# Patient Record
Sex: Female | Born: 1955
Health system: Southern US, Community
[De-identification: ages and names within clinical notes are randomized; demographics above are authoritative.]

## PROBLEM LIST (undated history)

## (undated) DIAGNOSIS — K635 Polyp of colon: Secondary | ICD-10-CM

## (undated) DIAGNOSIS — IMO0002 Reserved for concepts with insufficient information to code with codable children: Secondary | ICD-10-CM

## (undated) DIAGNOSIS — E039 Hypothyroidism, unspecified: Secondary | ICD-10-CM

## (undated) DIAGNOSIS — M779 Enthesopathy, unspecified: Secondary | ICD-10-CM

## (undated) DIAGNOSIS — F32A Depression, unspecified: Secondary | ICD-10-CM

## (undated) DIAGNOSIS — I2510 Atherosclerotic heart disease of native coronary artery without angina pectoris: Secondary | ICD-10-CM

## (undated) DIAGNOSIS — F419 Anxiety disorder, unspecified: Secondary | ICD-10-CM

## (undated) DIAGNOSIS — E7800 Pure hypercholesterolemia, unspecified: Secondary | ICD-10-CM

## (undated) DIAGNOSIS — N390 Urinary tract infection, site not specified: Secondary | ICD-10-CM

## (undated) DIAGNOSIS — F329 Major depressive disorder, single episode, unspecified: Secondary | ICD-10-CM

## (undated) DIAGNOSIS — I219 Acute myocardial infarction, unspecified: Secondary | ICD-10-CM

## (undated) DIAGNOSIS — M1990 Unspecified osteoarthritis, unspecified site: Secondary | ICD-10-CM

## (undated) DIAGNOSIS — R7611 Nonspecific reaction to tuberculin skin test without active tuberculosis: Secondary | ICD-10-CM

## (undated) DIAGNOSIS — I10 Essential (primary) hypertension: Secondary | ICD-10-CM

## (undated) DIAGNOSIS — I209 Angina pectoris, unspecified: Secondary | ICD-10-CM

## (undated) DIAGNOSIS — K219 Gastro-esophageal reflux disease without esophagitis: Secondary | ICD-10-CM

## (undated) DIAGNOSIS — E785 Hyperlipidemia, unspecified: Secondary | ICD-10-CM

## (undated) HISTORY — PX: POLYPECTOMY: SHX149

## (undated) HISTORY — DX: Reserved for concepts with insufficient information to code with codable children: IMO0002

## (undated) HISTORY — DX: Pure hypercholesterolemia, unspecified: E78.00

## (undated) HISTORY — DX: Unspecified osteoarthritis, unspecified site: M19.90

## (undated) HISTORY — DX: Essential (primary) hypertension: I10

## (undated) HISTORY — DX: Anxiety disorder, unspecified: F41.9

## (undated) HISTORY — DX: Hypothyroidism, unspecified: E03.9

## (undated) HISTORY — PX: TONGUE BIOPSY: SHX1075

## (undated) HISTORY — DX: Urinary tract infection, site not specified: N39.0

## (undated) HISTORY — PX: OTHER SURGICAL HISTORY: SHX169

## (undated) HISTORY — DX: Acute myocardial infarction, unspecified: I21.9

## (undated) HISTORY — DX: Enthesopathy, unspecified: M77.9

## (undated) HISTORY — PX: LAPAROSCOPY: SHX197

## (undated) HISTORY — DX: Polyp of colon: K63.5

## (undated) HISTORY — PX: LUMBAR EPIDURAL INJECTION: SHX1980

## (undated) HISTORY — DX: Depression, unspecified: F32.A

## (undated) HISTORY — DX: Atherosclerotic heart disease of native coronary artery without angina pectoris: I25.10

## (undated) HISTORY — DX: Hyperlipidemia, unspecified: E78.5

## (undated) HISTORY — DX: Major depressive disorder, single episode, unspecified: F32.9

## (undated) HISTORY — PX: TONSILLECTOMY: SUR1361

## (undated) HISTORY — PX: COLONOSCOPY: SHX174

## (undated) HISTORY — PX: TUBAL LIGATION: SHX77

## (undated) HISTORY — PX: BUNIONECTOMY: SHX129

## (undated) HISTORY — DX: Gastro-esophageal reflux disease without esophagitis: K21.9

---

## 2003-05-01 DIAGNOSIS — I2510 Atherosclerotic heart disease of native coronary artery without angina pectoris: Secondary | ICD-10-CM

## 2003-05-01 DIAGNOSIS — I219 Acute myocardial infarction, unspecified: Secondary | ICD-10-CM

## 2003-05-01 DIAGNOSIS — I209 Angina pectoris, unspecified: Secondary | ICD-10-CM

## 2003-05-01 HISTORY — PX: CORONARY ANGIOPLASTY WITH STENT PLACEMENT: SHX49

## 2003-05-01 HISTORY — DX: Atherosclerotic heart disease of native coronary artery without angina pectoris: I25.10

## 2003-05-01 HISTORY — DX: Angina pectoris, unspecified: I20.9

## 2003-05-01 HISTORY — DX: Acute myocardial infarction, unspecified: I21.9

## 2003-07-12 ENCOUNTER — Inpatient Hospital Stay (HOSPITAL_COMMUNITY): Admission: EM | Admit: 2003-07-12 | Discharge: 2003-07-15 | Payer: Self-pay | Admitting: *Deleted

## 2003-07-19 ENCOUNTER — Emergency Department (HOSPITAL_COMMUNITY): Admission: EM | Admit: 2003-07-19 | Discharge: 2003-07-19 | Payer: Self-pay | Admitting: Emergency Medicine

## 2003-08-23 ENCOUNTER — Other Ambulatory Visit: Admission: RE | Admit: 2003-08-23 | Discharge: 2003-08-23 | Payer: Self-pay | Admitting: Obstetrics and Gynecology

## 2004-12-29 ENCOUNTER — Other Ambulatory Visit: Admission: RE | Admit: 2004-12-29 | Discharge: 2004-12-29 | Payer: Self-pay | Admitting: Obstetrics and Gynecology

## 2005-03-07 ENCOUNTER — Ambulatory Visit: Payer: Self-pay | Admitting: Internal Medicine

## 2005-03-07 ENCOUNTER — Encounter: Payer: Self-pay | Admitting: Internal Medicine

## 2005-04-05 ENCOUNTER — Ambulatory Visit: Payer: Self-pay | Admitting: Internal Medicine

## 2005-04-30 HISTORY — PX: OTHER SURGICAL HISTORY: SHX169

## 2005-07-13 ENCOUNTER — Ambulatory Visit (HOSPITAL_COMMUNITY): Admission: RE | Admit: 2005-07-13 | Discharge: 2005-07-13 | Payer: Self-pay | Admitting: Orthopaedic Surgery

## 2005-07-24 ENCOUNTER — Encounter: Admission: RE | Admit: 2005-07-24 | Discharge: 2005-08-16 | Payer: Self-pay | Admitting: Orthopaedic Surgery

## 2006-01-23 ENCOUNTER — Other Ambulatory Visit: Admission: RE | Admit: 2006-01-23 | Discharge: 2006-01-23 | Payer: Self-pay | Admitting: Obstetrics and Gynecology

## 2006-04-20 ENCOUNTER — Emergency Department (HOSPITAL_COMMUNITY): Admission: EM | Admit: 2006-04-20 | Discharge: 2006-04-20 | Payer: Self-pay | Admitting: Emergency Medicine

## 2006-08-07 ENCOUNTER — Ambulatory Visit (HOSPITAL_COMMUNITY): Admission: RE | Admit: 2006-08-07 | Discharge: 2006-08-07 | Payer: Self-pay | Admitting: Neurosurgery

## 2006-08-28 ENCOUNTER — Ambulatory Visit (HOSPITAL_BASED_OUTPATIENT_CLINIC_OR_DEPARTMENT_OTHER): Admission: RE | Admit: 2006-08-28 | Discharge: 2006-08-28 | Payer: Self-pay | Admitting: Neurosurgery

## 2007-03-11 ENCOUNTER — Other Ambulatory Visit: Admission: RE | Admit: 2007-03-11 | Discharge: 2007-03-11 | Payer: Self-pay | Admitting: Obstetrics and Gynecology

## 2007-04-08 ENCOUNTER — Emergency Department (HOSPITAL_COMMUNITY): Admission: EM | Admit: 2007-04-08 | Discharge: 2007-04-08 | Payer: Self-pay | Admitting: Emergency Medicine

## 2007-05-01 HISTORY — PX: CORONARY ANGIOPLASTY: SHX604

## 2007-08-01 ENCOUNTER — Ambulatory Visit: Payer: Self-pay | Admitting: *Deleted

## 2007-08-02 ENCOUNTER — Ambulatory Visit: Payer: Self-pay | Admitting: *Deleted

## 2007-08-02 ENCOUNTER — Inpatient Hospital Stay (HOSPITAL_COMMUNITY): Admission: EM | Admit: 2007-08-02 | Discharge: 2007-08-04 | Payer: Self-pay | Admitting: Emergency Medicine

## 2007-08-14 ENCOUNTER — Encounter (HOSPITAL_COMMUNITY): Admission: RE | Admit: 2007-08-14 | Discharge: 2007-11-12 | Payer: Self-pay | Admitting: Cardiovascular Disease

## 2007-08-27 ENCOUNTER — Inpatient Hospital Stay (HOSPITAL_COMMUNITY): Admission: RE | Admit: 2007-08-27 | Discharge: 2007-08-28 | Payer: Self-pay | Admitting: Cardiovascular Disease

## 2007-11-13 ENCOUNTER — Encounter (HOSPITAL_COMMUNITY): Admission: RE | Admit: 2007-11-13 | Discharge: 2008-01-22 | Payer: Self-pay | Admitting: Cardiovascular Disease

## 2009-04-26 ENCOUNTER — Encounter: Admission: RE | Admit: 2009-04-26 | Discharge: 2009-04-26 | Payer: Self-pay | Admitting: Internal Medicine

## 2009-09-30 ENCOUNTER — Ambulatory Visit (HOSPITAL_COMMUNITY)
Admission: RE | Admit: 2009-09-30 | Discharge: 2009-09-30 | Payer: Self-pay | Source: Home / Self Care | Admitting: Orthopedic Surgery

## 2010-01-20 ENCOUNTER — Ambulatory Visit: Payer: Self-pay | Admitting: Cardiovascular Disease

## 2010-01-20 LAB — LIPID PANEL
Cholesterol: 184 mg/dL (ref 0–200)
Triglycerides: 81 mg/dL (ref 40–160)

## 2010-01-24 ENCOUNTER — Ambulatory Visit: Payer: Self-pay | Admitting: Cardiovascular Disease

## 2010-05-04 DIAGNOSIS — I2510 Atherosclerotic heart disease of native coronary artery without angina pectoris: Secondary | ICD-10-CM | POA: Insufficient documentation

## 2010-05-04 DIAGNOSIS — K649 Unspecified hemorrhoids: Secondary | ICD-10-CM | POA: Insufficient documentation

## 2010-05-04 DIAGNOSIS — R519 Headache, unspecified: Secondary | ICD-10-CM | POA: Insufficient documentation

## 2010-05-04 DIAGNOSIS — F411 Generalized anxiety disorder: Secondary | ICD-10-CM | POA: Insufficient documentation

## 2010-05-04 DIAGNOSIS — E785 Hyperlipidemia, unspecified: Secondary | ICD-10-CM | POA: Insufficient documentation

## 2010-05-04 DIAGNOSIS — R51 Headache: Secondary | ICD-10-CM | POA: Insufficient documentation

## 2010-05-04 DIAGNOSIS — F329 Major depressive disorder, single episode, unspecified: Secondary | ICD-10-CM | POA: Insufficient documentation

## 2010-05-10 ENCOUNTER — Ambulatory Visit
Admission: RE | Admit: 2010-05-10 | Discharge: 2010-05-10 | Payer: Self-pay | Source: Home / Self Care | Attending: Internal Medicine | Admitting: Internal Medicine

## 2010-05-10 ENCOUNTER — Encounter: Payer: Self-pay | Admitting: Internal Medicine

## 2010-05-10 DIAGNOSIS — I10 Essential (primary) hypertension: Secondary | ICD-10-CM | POA: Insufficient documentation

## 2010-05-10 DIAGNOSIS — M129 Arthropathy, unspecified: Secondary | ICD-10-CM | POA: Insufficient documentation

## 2010-05-10 DIAGNOSIS — E079 Disorder of thyroid, unspecified: Secondary | ICD-10-CM | POA: Insufficient documentation

## 2010-05-16 ENCOUNTER — Ambulatory Visit: Admission: RE | Admit: 2010-05-16 | Payer: Self-pay | Source: Home / Self Care | Admitting: Internal Medicine

## 2010-05-16 ENCOUNTER — Other Ambulatory Visit: Payer: Self-pay | Admitting: Internal Medicine

## 2010-05-16 ENCOUNTER — Ambulatory Visit
Admission: RE | Admit: 2010-05-16 | Discharge: 2010-05-16 | Payer: Self-pay | Source: Home / Self Care | Attending: Internal Medicine | Admitting: Internal Medicine

## 2010-05-22 ENCOUNTER — Encounter: Payer: Self-pay | Admitting: Internal Medicine

## 2010-06-01 NOTE — Letter (Signed)
Summary: [facility] Instructions  [facility]  [address] [place], [place] [address]   Phone: [phone]  Fax: [phone]       Sarah Ballard    Apr 16, 1956    MRN: [id number]       Procedure Day /Date: Tuesday 05/16/10     Arrival Time: 10:30 am     Procedure Time: 11:30 am     Location of Procedure:                    _x _  [facility] (4th Floor)  PREPARATION FOR COLONOSCOPY WITH MIRALAX  Starting 5 days prior to your procedure 05/11/10 do not eat nuts, seeds, popcorn, corn, beans, peas,  salads, or any raw vegetables.  Do not take any fiber supplements (e.g. Metamucil, Citrucel, and Benefiber). ____________________________________________________________________________________________________   THE DAY BEFORE YOUR PROCEDURE         DATE: 05/15/10 DAY: Monday  1   Drink clear liquids the entire day-NO SOLID FOOD  2   Do not drink anything colored red or purple.  Avoid juices with pulp.  No orange juice.  3   Drink at least 64 oz. (8 glasses) of fluid/clear liquids during the day to prevent dehydration and help the prep work efficiently.  CLEAR LIQUIDS INCLUDE: Water Jello Ice Popsicles Tea (sugar ok, no milk/cream) Powdered fruit flavored drinks Coffee (sugar ok, no milk/cream) Gatorade Juice: apple, white grape, white cranberry  Lemonade Clear bullion, consomm, broth Carbonated beverages (any kind) Strained chicken noodle soup Hard Candy  4   Mix the entire bottle of Miralax with 64 oz. of Gatorade/Powerade in the morning and put in the refrigerator to chill.  5   At 3:00 pm take 2 Dulcolax/Bisacodyl tablets.  6   At 4:30 pm take one Reglan/Metoclopramide tablet.  7  Starting at 5:00 pm drink one 8 oz glass of the Miralax mixture every 15-20 minutes until you have finished drinking the entire 64 oz.  You should finish drinking prep around 7:30 or 8:00 pm.  8   If you are nauseated, you may take the 2nd Reglan/Metoclopramide tablet  at 6:30 pm.        9    At 8:00 pm take 2 more DULCOLAX/Bisacodyl tablets.         THE DAY OF YOUR PROCEDURE      DATE:  05/16/10 DAY: Tuesday  You may drink clear liquids until 9:30 am (2 HOURS BEFORE PROCEDURE).   MEDICATION INSTRUCTIONS  Unless otherwise instructed, you should take regular prescription medications with a small sip of water as early as possible the morning of your procedure.  .Continue taking Coumadin/warfarin per Dr. Lina Sar.        OTHER INSTRUCTIONS  You will need a responsible adult at least 56 years of age to accompany you and drive you home.   This person must remain in the waiting room during your procedure.  Wear loose fitting clothing that is easily removed.  Leave jewelry and other valuables at home.  However, you may wish to bring a book to read or an iPod/MP3 player to listen to music as you wait for your procedure to start.  Remove all body piercing jewelry and leave at home.  Total time from sign-in until discharge is approximately 2-3 hours.  You should go home directly after your procedure and rest.  You can resume normal activities the day after your procedure.  The day of your procedure you  should not:   Drive   Make legal decisions   Operate machinery   Drink alcohol   Return to work  You will receive specific instructions about eating, activities and medications before you leave.   The above instructions have been reviewed and explained to me by   Lamona Curl CMA Duncan Dull)  May 10, 2010 12:06 PM     I fully understand and can verbalize these instructions _____________________________ Date _______

## 2010-06-01 NOTE — Assessment & Plan Note (Signed)
Summary: Gastroenterology  [facility] A MR#:  [id number] Page #  NAME:  Sarah Ballard, Sarah Ballard  OFFICE NO:  [id number]  DATE:  03/07/05  DOB:  2055/12/05  HISTORY OF PRESENT ILLNESS:  The patient is a very nice 55 year old white female who is here to discuss colonoscopy.  She has a history of coronary artery disease status post percutaneous coronary intervention 1 year ago by Dr. Elease Hashimoto for obstruction of left anterior descending.  She is currently on Plavix and aspirin.  She has a strong family history of colon cancer in her mother who died at the age of 33 of advanced colon cancer, which was found at the age of 31.  She died 8 weeks later after surgery.  She also has a family medical history of Crohn's disease in a maternal aunt.  The patient herself is asymptomatic.  She has regular bowel habits, mostly thanks to taking Metamucil on a daily basis.  If she does not take her Metamucil or high fiber, she tends to get constipated.  She has symptomatic hemorrhoids, which flare up at times.  She just turned in Hemoccult cards for Dr. Timothy Lasso, but does not know the results of it yet.  She has had negative Hemoccults in the past.    MEDICATIONS:  Norethindrone 1 a day, furosemide 20 mg p.r.n., Crestor 20 mg q.d., Ecotrin 325 mg p.o. q.d., Plavix 75 mg p.o. q.d., hydrochlorothiazide 25 mg p.o. q.d., Serzone 200 mg b.i.d., Lopressor 25 mg p.o. b.i.d., _________ 12.5 mg p.o. q.d., ________ 1.2 mcg  tablet Monday through Friday, Xanax 0.5 mg b.i.d., Metamucil, Caltrate, and multiple vitamins.  PAST MEDICAL HISTORY:  Coronary artery disease, high cholesterol, thyroid trouble, anxiety, depression, allergies, and chronic headaches.  OPERATIONS:  Angioplasty with stent placement in March 2005.  FAMILY HISTORY:  Positive for colon cancer in mother, inflammatory bowel disease in father's family, heart disease in father and aunt, as well as herself.  SOCIAL HISTORY:  She is divorced, has 3 children.  She is a Engineer, civil (consulting) in the  newborn nursery at [employer]  She does not smoke and drinks a glass of wine on occasion.    REVIEW OF SYSTEMS:  Positive for eyeglasses, allergies, night sweats, and some muscle pain on Lipitor.  PHYSICAL EXAMINATION:  VITAL SIGNS:  Blood pressure 122/80, pulse 78, weight 182 pounds.  GENERAL:  She was alert, oriented, in no distress.  HEENT:  Sclerae are anicteric.  Oral cavity was noted.  NECK:  Supple without lymphadenopathy.  LUNGS:  Clear to auscultation.  COR:  Normal S1, normal S2.  Quiet precordium.  ABDOMEN:  Soft, nontender with normoactive bowel sounds.  Liver edge at costal margin.  RECTAL:  Not done.  EXTREMITIES:  No edema.  IMPRESSION:   58.  A 55 year old white female essentially asymptomatic as far as gastrointestinal problems are concerned.  She has a strong family history of colon cancer in her mother.  She is a good candidate for screening colonoscopy. 2.  Coronary artery disease status post coronary stent placement in 2005.  The patient is on Plavix and aspirin.  PLAN:   1.  Colonoscopy scheduled with routine colonoscopy preparation. 2.  Continue Plavix through the procedure to prevent complications that could arise from obstruction of the stent. 3.  Discontinue Ecotrin 1 week prior to the procedure.  She will be able to resume it after the procedure.  The procedure, as well as the preparation and the risks of the procedure were explained and discussed with  the patient.     Hedwig Morton. Juanda Chance, M.D.  [id number] cc:  Creola Corn, M.D.       Leodis Sias, M.D. D:  03/07/05; T:  ; Job [id number]

## 2010-06-01 NOTE — Assessment & Plan Note (Signed)
Summary: ON RECALL FOR COLON...LSW.   History of Present Illness Visit Type: new patient  Primary GI MD: Lina Sar MD Primary Provider: Creola Corn, MD  Requesting Provider: na Chief Complaint: Pt c/o constipation, chest pain, gas, and GERD  History of Present Illness:   This is a 55 year old white female with a strong family history of colon cancer on her mother and paternal grandmother. She has chronic constipation and she is on Plavix for coronary artery disease. She is status post PCI in 2005 and restenting in 2011. Her last colonoscopy in December 2006 showed external hemorrhoids. She takes Colace and MiraLax for constipation.   GI Review of Systems    Reports acid reflux, bloating, chest pain, and  heartburn.      Denies abdominal pain, belching, dysphagia with liquids, dysphagia with solids, loss of appetite, nausea, vomiting, vomiting blood, weight loss, and  weight gain.      Reports constipation.     Denies anal fissure, black tarry stools, change in bowel habit, diarrhea, diverticulosis, fecal incontinence, heme positive stool, hemorrhoids, irritable bowel syndrome, jaundice, light color stool, liver problems, rectal bleeding, and  rectal pain.    Current Medications (verified): 1)  Lovaza 1 Gm Caps (Omega-3-Acid Ethyl Esters) .... One Capsule By Mouth Two Times A Day 2)  Levoxyl 125 Mcg Tabs (Levothyroxine Sodium) .... One Tablet By Mouth Once Daily 3)  Plavix 75 Mg Tabs (Clopidogrel Bisulfate) .... One Tablet By Mouth Once Daily 4)  Zoloft 50 Mg Tabs (Sertraline Hcl) .... As Directed 5)  Aspirin 81 Mg  Tabs (Aspirin) .... One Tablet By Mouth Once Daily 6)  Avapro 150 Mg Tabs (Irbesartan) .... One Tablet By Mouth Once Daily 7)  Xanax 0.5 Mg Tabs (Alprazolam) .... As Directed 8)  Lopressor Hct 50-25 Mg Tabs (Metoprolol-Hydrochlorothiazide) .... One Tablet By Mouth Two Times A Day 9)  Colace 100 Mg Caps (Docusate Sodium) .... As Directed 10)  Crestor 20 Mg Tabs (Rosuvastatin  Calcium) .... One Tablet By Mouth Once Daily 11)  Caltrate 600+d 600-400 Mg-Unit Tabs (Calcium Carbonate-Vitamin D) .... One Tablet By Mouth Once Daily  Allergies (verified): 1)  ! Ultram 2)  ! * Latex  Past History:  Past Medical History:  HX of POSITIVE PPD TEST- treated with INH x 1 year ARTHRITIS (ICD-716.90) THYROID DISORDER (ICD-246.9) HYPERTENSION (ICD-401.9) * HX POSITIVE PPD TEST HEADACHE, CHRONIC (ICD-784.0) DEPRESSION (ICD-311) ANXIETY (ICD-300.00) HYPERLIPIDEMIA (ICD-272.4) HEMORRHOIDS (ICD-455.6) Family Hx of COLON CANCER (ICD-153.9) CORONARY ARTERY DISEASE (ICD-414.00)  Past Surgical History: Angioplasty/Stent Placement Tonsillectomy  Family History: Reviewed history from 05/04/2010 and no changes required. Family History of Colon Cancer: Mother, PGM Family History of Colitis/Crohn's: Paternal Aunt Family History of Heart Disease: Father, Aunts Family History of Diabetes: Mother, Father  Social History: RN--Womens Married Childern Alcohol Use - yes-rare Illicit Drug Use - no Patient has never smoked.  Daily Caffeine Use: 1-2 daily  Smoking Status:  never  Review of Systems       The patient complains of allergy/sinus, anxiety-new, arthritis/joint pain, back pain, change in vision, depression-new, headaches-new, hearing problems, itching, muscle pains/cramps, shortness of breath, skin rash, sleeping problems, swelling of feet/legs, and urine leakage.  The patient denies anemia, blood in urine, breast changes/lumps, confusion, cough, coughing up blood, fainting, fatigue, fever, heart murmur, heart rhythm changes, menstrual pain, night sweats, nosebleeds, pregnancy symptoms, sore throat, swollen lymph glands, thirst - excessive , urination - excessive , urination changes/pain, vision changes, and voice change.  Pertinent positive and negative review of systems were noted in the above HPI. All other ROS was otherwise negative.   Vital  Signs:  Patient profile:   55 year old female Height:      65 inches Weight:      189 pounds BMI:     31.56 BSA:     1.93 Pulse rate:   68 / minute Pulse rhythm:   regular BP sitting:   132 / 76  (left arm) Cuff size:   regular  Vitals Entered By: Ok Anis CMA (May 10, 2010 11:27 AM)  Physical Exam  General:  Well developed, well nourished, no acute distress. Eyes:  PERRLA, no icterus. Mouth:  No deformity or lesions, dentition normal. Neck:  Supple; no masses or thyromegaly. Lungs:  Clear throughout to auscultation. Heart:  Regular rate and rhythm; no murmurs, rubs,  or bruits. Abdomen:  right lower quadrant tenderness but no mass or fullness. Normoactive bowel sounds. Liver edge at costal margin. Rectal:  normal rectal sphincter tone, normal perianal area. Stool is Hemoccult negative. Extremities:  No clubbing, cyanosis, edema or deformities noted. Skin:  Intact without significant lesions or rashes. Psych:  Alert and cooperative. Normal mood and affect.   Impression & Recommendations:  Problem # 1:  Family Hx of COLON CANCER (ICD-153.9)  Patient is due for a repeat colonoscopy which we will schedule today. She will stay on Plavix for the prep and during the colonoscopy because of her high-risk of stent occlusion. Her right lower quadrant tenderness is likely related to her constipation.  Orders: Colonoscopy (Colon)  Problem # 2:  HEMORRHOIDS (ICD-455.6) There is no evidence for acute GI blood loss. She needs to increase her MiraLax to 1/2 capful 3 times a week, prune juice and start on a regular exercise program.  Patient Instructions: 1)  You have been scheduled for a colonoscopy. Please follow written prep instructions that were given to you today at your visit.  2)  Continue Plavix during the exam. 3)  Please pick up your prescription for Miralax, Dulcolax and Reglan at the pharmacy. An electronic presription has already been sent. 4)  Start an exercise  program to improve bowel habits. 5)  Increase MiraLax to 1/2 capful 3 times a week. 6)  Copy sent to : Dr Timothy Lasso 7)  The medication list was reviewed and reconciled.  All changed / newly prescribed medications were explained.  A complete medication list was provided to the patient / caregiver. Prescriptions: DULCOLAX 5 MG  TBEC (BISACODYL) Day before procedure take 2 at 3pm and 2 at 8pm.  #4 x 0   Entered by:   Lamona Curl CMA (AAMA)   Authorized by:   Hart Carwin MD   Signed by:   Lamona Curl CMA (AAMA) on 05/10/2010   Method used:   Electronically to        [facility]* (retail)       [address]       [address] Shipping/mailing       [place], [place]  [address]       Ph: [phone]       Fax: [phone]   RxID:   [id number] REGLAN 10 MG  TABS (METOCLOPRAMIDE HCL) As per prep instructions.  #2 x 0   Entered by:   Lamona Curl CMA (AAMA)   Authorized by:   Hart Carwin MD   Signed by:   Lamona Curl CMA (AAMA) on 05/10/2010  Method used:   Electronically to        [facility]* (retail)       [address]       [address] Shipping/mailing       [place], [place]  [address]       Ph: [phone]       Fax: [phone]   RxID:   [id number] MIRALAX   POWD (POLYETHYLENE GLYCOL 3350) As per prep  instructions.  #255gm x 0   Entered by:   Lamona Curl CMA (AAMA)   Authorized by:   Hart Carwin MD   Signed by:   Lamona Curl CMA (AAMA) on 05/10/2010   Method used:   Electronically to        [facility]* (retail)       [address]       [address] Shipping/mailing       [place], [place]  [address]       Ph: [phone]       Fax: [phone]   RxID:   [id number]

## 2010-06-01 NOTE — Procedures (Addendum)
Summary: Colonoscopy  Patient: Sarah Ballard Note: All result statuses are Final unless otherwise noted.  Tests: (1) Colonoscopy (COL)   COL Colonoscopy           DONE     [facility]     [address]     [place], [place]  [address]           COLONOSCOPY PROCEDURE REPORT           PATIENT:  Sarah Ballard, Sarah Ballard  MR#:  [id number]     BIRTHDATE:  December 12, 1955, 54 yrs. old  GENDER:  female     ENDOSCOPIST:  Hedwig Morton. Juanda Chance, MD     REF. BY:  Creola Corn, M.D.     PROCEDURE DATE:  05/16/2010     PROCEDURE:  Colonoscopy 19[address]     ASA CLASS:  Class I     INDICATIONS:  family history of colon cancer mother and paternal     GM with colon cancer     last colon 2006, chronic constipation,     Pt on Plavix     MEDICATIONS:   Versed 8 mg, Fentanyl 75 mcg           DESCRIPTION OF PROCEDURE:   After the risks benefits and     alternatives of the procedure were thoroughly explained, informed     consent was obtained.  Digital rectal exam was performed and     revealed no rectal masses.   The [id number] endoscope was     introduced through the anus and advanced to the cecum, which was     identified by both the appendix and ileocecal valve, without     limitations.  The quality of the prep was excellent, using     MiraLax.  The instrument was then slowly withdrawn as the colon     was fully examined.     <<PROCEDUREIMAGES>>           FINDINGS:  A sessile polyp was found. s mm polyp at 75 cm in the     right colon The polyp was removed using cold biopsy forceps (see     image1).  This was otherwise a normal examination of the colon     (see image2, image3, image4, and image5).   Retroflexed views in     the rectum revealed no abnormalities.    The scope was then     withdrawn from the patient and the procedure completed.           COMPLICATIONS:  None     ENDOSCOPIC IMPRESSION:     1) Sessile polyp     2) Otherwise normal examination     RECOMMENDATIONS:     1) Await pathology  results     continue Miralax and Colace     continue Plavix     REPEAT EXAM:  In 5 year(s) for.           ______________________________     Hedwig Morton. Juanda Chance, MD           CC:  Creola Corn, M.D.           n.     eSIGNED:   Hedwig Morton. Danzig Macgregor at 05/16/2010 12:09 PM           Odette Horns, [phone]  Note: An exclamation mark (!) indicates a result that was not dispersed into the flowsheet. Document Creation Date: 05/16/2010 12:09 PM _______________________________________________________________________  (1) Order result status:  Final Collection or observation date-time: 05/16/2010 12:02 Requested date-time:  Receipt date-time:  Reported date-time:  Referring Physician:   Ordering Physician: Lina Sar [phone]) Specimen Source:  Source: Launa Grill Order Number: [id number] Lab site:   Appended Document: Colonoscopy recall     Procedures Next Due Date:    Colonoscopy: 05/2015

## 2010-06-01 NOTE — Letter (Signed)
Summary: Patient Notice- Polyp Results  [facility]  [address] [place], [place] [address]   Phone: [phone]  Fax: [phone]        May 22, 2010 MRN: [id number]    [facility] [address] [place], [place]  [address]    Dear Ms. Cecilio,  I am pleased to inform you that the colon polyp(s) removed during your recent colonoscopy was (were) found to be benign (no cancer detected) upon pathologic examination.  I recommend you have a repeat colonoscopy examination in 5_ years to look for recurrent polyps, as having colon polyps increases your risk for having recurrent polyps or even colon cancer in the future.  Should you develop new or worsening symptoms of abdominal pain, bowel habit changes or bleeding from the rectum or bowels, please schedule an evaluation with either your primary care physician or with me.  Additional information/recommendations:  _x_ No further action with gastroenterology is needed at this time. Please      follow-up with your primary care physician for your other healthcare      needs.  __ Please call [phone] to schedule a return visit to review your      situation.  __ Please keep your follow-up visit as already scheduled.  __ Continue treatment plan as outlined the day of your exam.  Please call us if you are having persistent problems or have questions about your condition that have not been fully answered at this time.  Sincerely,  Hart Carwin MD  This letter has been electronically signed by your physician.  Appended Document: Patient Notice- Polyp Results LETTER MAILED

## 2010-06-01 NOTE — Procedures (Signed)
Summary: COLON   Colonoscopy  Procedure date:  04/05/2005  Findings:      Location:  [facility].    Procedures Next Due Date:    Colonoscopy: 03/2015 Patient Name: Sarah Ballard, Sarah a. MRN:  Procedure Procedures: Colonoscopy CPT: [phone].  Personnel: Endoscopist: Almarosa Bohac L. Juanda Chance, MD.  Exam Location: Exam performed in Outpatient Clinic. Outpatient  Patient Consent: Procedure, Alternatives, Risks and Benefits discussed, consent obtained, from patient. Consent was obtained by the RN.  Indications  Average Risk Screening Routine.  History  Current Medications: Patient is not currently taking Coumadin.  Pre-Exam Physical: Performed Apr 05, 2005. Entire physical exam was normal.  Exam Exam: Extent of exam reached: Cecum, extent intended: Cecum.  The cecum was identified by appendiceal orifice and IC valve. Colon retroflexion performed. Images taken. ASA Classification: I. Tolerance: good.  Monitoring: Pulse and BP monitoring, Oximetry used. Supplemental O2 given.  Colon Prep Used Miralax for colon prep.  Sedation Meds: Patient assessed and found to be appropriate for moderate (conscious) sedation. Fentanyl 100 mcg. given IV. Versed 10 mg. given IV.  Findings - NORMAL EXAM: Cecum.  - HEMORRHOIDS: External. Size: Grade I. ICD9: Hemorrhoids, External: 455.3.   Assessment Normal examination.  Diagnoses: 455.3: Hemorrhoids, External.   Comments: no polyps Events  Unplanned Interventions: No intervention was required.  Unplanned Events: There were no complications. Plans Patient Education: Patient given standard instructions for: Yearly hemoccult testing recommended. Patient instructed to get routine colonoscopy every 10 years.  Disposition: After procedure patient sent to recovery. After recovery patient sent home.   This report was created from the original endoscopy report, which was reviewed and signed by the above listed endoscopist.

## 2010-06-19 ENCOUNTER — Encounter: Payer: Self-pay | Admitting: Cardiovascular Disease

## 2010-06-19 DIAGNOSIS — IMO0002 Reserved for concepts with insufficient information to code with codable children: Secondary | ICD-10-CM | POA: Insufficient documentation

## 2010-07-27 ENCOUNTER — Encounter: Payer: Self-pay | Admitting: Cardiovascular Disease

## 2010-07-27 ENCOUNTER — Ambulatory Visit (INDEPENDENT_AMBULATORY_CARE_PROVIDER_SITE_OTHER): Payer: Commercial Managed Care - PPO | Admitting: Cardiovascular Disease

## 2010-07-27 VITALS — BP 132/84 | HR 72 | Wt 190.0 lb

## 2010-07-27 DIAGNOSIS — E7800 Pure hypercholesterolemia, unspecified: Secondary | ICD-10-CM

## 2010-07-27 DIAGNOSIS — I10 Essential (primary) hypertension: Secondary | ICD-10-CM

## 2010-07-27 DIAGNOSIS — E785 Hyperlipidemia, unspecified: Secondary | ICD-10-CM

## 2010-07-27 DIAGNOSIS — F329 Major depressive disorder, single episode, unspecified: Secondary | ICD-10-CM

## 2010-07-27 DIAGNOSIS — I2510 Atherosclerotic heart disease of native coronary artery without angina pectoris: Secondary | ICD-10-CM

## 2010-07-27 DIAGNOSIS — F32A Depression, unspecified: Secondary | ICD-10-CM

## 2010-07-27 DIAGNOSIS — Z79899 Other long term (current) drug therapy: Secondary | ICD-10-CM

## 2010-07-27 LAB — LIPID PANEL
LDL Cholesterol: 85 mg/dL (ref 0–99)
Total CHOL/HDL Ratio: 3
Triglycerides: 78 mg/dL (ref 0.0–149.0)
VLDL: 15.6 mg/dL (ref 0.0–40.0)

## 2010-07-27 LAB — COMPREHENSIVE METABOLIC PANEL
ALT: 18 U/L (ref 0–35)
AST: 23 U/L (ref 0–37)
Alkaline Phosphatase: 67 U/L (ref 39–117)
Calcium: 8.9 mg/dL (ref 8.4–10.5)
Chloride: 104 mEq/L (ref 96–112)
Creatinine, Ser: 0.8 mg/dL (ref 0.4–1.2)

## 2010-07-27 MED ORDER — SERTRALINE HCL 50 MG PO TABS: 50.0000 mg | ORAL_TABLET | Freq: Every day | ORAL | Status: DC

## 2010-07-27 NOTE — Assessment & Plan Note (Signed)
Her blood pressure is acceptable today. I've encouraged her to continue with a good diet and exercise program which will help with her blood pressure.

## 2010-07-27 NOTE — Progress Notes (Signed)
History of Present Illness:  Sarah Ballard is doing well from a cardiac standpoint. She is not having episodes of chest pain or shortness of breath.  Current Outpatient Prescriptions on File Prior to Visit  Medication Sig Dispense Refill  . ALPRAZolam (XANAX) 0.5 MG tablet Take 0.5 mg by mouth 3 (three) times daily as needed.        Marland Kitchen aspirin 81 MG tablet Take 81 mg by mouth daily.        . Calcium Carbonate-Vitamin D (CALTRATE 600+D) 600-400 MG-UNIT per tablet Take 1 tablet by mouth daily.        . clopidogrel (PLAVIX) 75 MG tablet Take 75 mg by mouth daily.        Marland Kitchen docusate sodium (COLACE) 100 MG capsule Take 100 mg by mouth as needed.        . irbesartan (AVAPRO) 150 MG tablet Take 150 mg by mouth daily.        Marland Kitchen levothyroxine (SYNTHROID, LEVOTHROID) 125 MCG tablet Take 125 mcg by mouth daily. 1 TABLET DAILY, 1/2 TABLET ON SUNDAY       . metoprolol (LOPRESSOR) 25 MG tablet Take 25 mg by mouth 2 (two) times daily.        . nitroGLYCERIN (NITROSTAT) 0.4 MG SL tablet Place 0.4 mg under the tongue every 5 (five) minutes as needed.        Marland Kitchen omega-3 acid ethyl esters (LOVAZA) 1 G capsule Take 2 g by mouth 2 (two) times daily.        . rosuvastatin (CRESTOR) 20 MG tablet Take 20 mg by mouth daily.        Marland Kitchen DISCONTD: sertraline (ZOLOFT) 100 MG tablet Take 100 mg by mouth daily.          Allergies  Allergen Reactions  . Latex   . Lisinopril Cough  . Tramadol Hcl     REACTION: nausea, vomiting    Past Medical History  Diagnosis Date  . CAD (coronary artery disease)   . Hypothyroidism   . Hyperlipemia   . Hypertension   . Anxiety   . Degenerative disc disease     Past Surgical History  Procedure Date  . Coronary angioplasty   . Nerve root block     History  Smoking status  . Never Smoker   Smokeless tobacco  . Not on file    History  Alcohol Use  . Yes    occ wine    Family History  Problem Relation Age of Onset  . Colon cancer Mother   . Stroke Mother   . Hypertension  Mother   . Heart attack Father     x2    Review of Systems: The patient denies any heat or cold intolerance.  No weight gain or weight loss.  The patient denies headaches or blurry vision.  There is no cough or sputum production.  The patient denies dizziness.  There is no hematuria or hematochezia.  The patient denies any muscle aches or arthritis.  The patient denies any rash.  The patient denies frequent falling or instability.  There is no history of depression or anxiety.  All other systems were reviewed and are negative.  Physical Exam: BP 132/84  Pulse 72  Wt 190 lb (86.183 kg) The patient is alert and oriented x 3.  The mood and affect are normal.  The skin is warm and dry.  Color is normal.  The HEENT exam reveals that the sclera are nonicteric.  The  mucous membranes are moist.  The carotids are 2+ without bruits.  There is no thyromegaly.  There is no JVD.  The lungs are clear.  The chest wall is non tender.  The heart exam reveals a regular rate with a normal S1 and S2.  There are no murmurs, gallops, or rubs.  The PMI is not displaced.   Abdominal exam reveals good bowel sounds.  There is no guarding or rebound.  There is no hepatosplenomegaly or tenderness.  There are no masses.  Exam of the legs reveal no clubbing, cyanosis, or edema.  The legs are without rashes.  The distal pulses are intact.  Cranial nerves II - XII are intact.  Motor and sensory functions are intact.  The gait is normal.  Lipids and CMET pending  Assessment / Plan:

## 2010-07-27 NOTE — Assessment & Plan Note (Signed)
Sarah Ballard is doing fine from a cardiac standpoint. She is not having any episodes of chest pain or shortness breath. We'll continue with her current medications.

## 2010-07-28 ENCOUNTER — Telehealth: Payer: Self-pay | Admitting: *Deleted

## 2010-07-28 NOTE — Telephone Encounter (Signed)
Pt instructed regarding labs

## 2010-08-28 ENCOUNTER — Ambulatory Visit (INDEPENDENT_AMBULATORY_CARE_PROVIDER_SITE_OTHER): Payer: 59

## 2010-08-28 ENCOUNTER — Inpatient Hospital Stay (INDEPENDENT_AMBULATORY_CARE_PROVIDER_SITE_OTHER)
Admission: RE | Admit: 2010-08-28 | Discharge: 2010-08-28 | Disposition: A | Discharge status: Home / Self Care | Payer: Commercial Managed Care - PPO | Source: Ambulatory Visit | Attending: Family Medicine | Admitting: Family Medicine

## 2010-08-28 DIAGNOSIS — J40 Bronchitis, not specified as acute or chronic: Secondary | ICD-10-CM

## 2010-08-28 DIAGNOSIS — J069 Acute upper respiratory infection, unspecified: Secondary | ICD-10-CM

## 2010-08-31 ENCOUNTER — Encounter: Payer: Self-pay | Admitting: Cardiovascular Disease

## 2010-09-12 NOTE — Discharge Summary (Signed)
[name], [name]               ACCOUNT NO.:  [id number]   MEDICAL RECORD NO.:  [id number]          PATIENT TYPE:  INP   LOCATION:  2627                         FACILITY:  [facility]   PHYSICIAN:  Vesta Mixer, M.D. DATE OF BIRTH:  05/08/1955   DATE OF ADMISSION:  08/01/2007  DATE OF DISCHARGE:  08/04/2007                               DISCHARGE SUMMARY   DISCHARGE DIAGNOSES:  1. Chest pain - noncardiac.  2. History of coronary artery disease - status post percutaneous      transluminal coronary angioplasty and stenting of her left anterior      descending.  3. She has residual 50% stenosis in the left anterior descending but      it is doubtful that this is the cause of her chest pain.  4. History of hypertension.  5. Hyperlipidemia.  6. Hypothyroidism.  7. Anxiety and depression.   DISCHARGE MEDICATIONS:  1. Aspirin 325 mg a day.  2. Avapro 150 mg a day.  3. Crestor 20 mg a day.  4. Lopressor 25 mg a day.  5. Plavix 75 mg a day.  6. Synthroid 0.025 mg a day.  7. Zoloft 100 mg a day.  8. Caltrate once a day.  9. Xanax as needed.   DISPOSITION:  The patient will see Dr. Elease Hashimoto in 1-2 weeks for followup  visit.  She is to call sooner if problems.  We will anticipate doing a  stress Cardiolite study as an outpatient to assess her moderate LAD  stenosis.   HISTORY:  Sarah Ballard is a 55 year old female with a history of PTCA  and stenting of her proximal left anterior descending artery.  She was  admitted to the hospital with episodes of chest pain that were very  similar to her previous episodes of chest pain several years ago.  Please see dictated H and P for further details.   HOSPITAL COURSE PROBLEMS:  Chest pain.  The patient ruled out for  myocardial infarction.  She did not have any EKG changes.  Because the  episodes of chest pain were so similar to her presenting episodes of  pain several years ago, we decided to proceed with heart  catheterization.  Her  proximal LAD stent was found to be widely patent.  There was a 50% stenosis just distal to the stent.  There was also a  distal LAD stenosis that had a 40% to 50% stenosis.  This distal LAD  stenosis appeared to be associated with muscular bridge and may in fact  be the cause of her angina.  She does have a very small first obtuse  marginal artery that is occluded and fills via left-to-left collaterals.  These vessels could be the cause of her chest pain but it is certainly  too small to consider angioplasty.  This does not appear to be acute and  it is most likely a chronic occlusion.   Her left ventricular systolic function is normal.   The patient will be discharged on the above-noted medications.  We will  see her back in the office in several weeks, and  we will anticipate  doing a stress Cardiolite study sometime this week or so.  She is to  continue with all of her other medications.  All of her other medical  problems are stable.           ______________________________  Vesta Mixer, M.D.     PJN/MEDQ  D:  08/04/2007  T:  08/04/2007  Job:  [id number]   cc:   [facility]

## 2010-09-12 NOTE — H&P (Signed)
Sarah Ballard, Sarah Ballard               ACCOUNT NO.:  [id number]   MEDICAL RECORD NO.:  [id number]           PATIENT TYPE:   LOCATION:                                 FACILITY:   PHYSICIAN:  Vesta Mixer, M.D. DATE OF BIRTH:  09-29-1955   DATE OF ADMISSION:  DATE OF DISCHARGE:                              HISTORY & PHYSICAL   Sarah Ballard is a middle-aged female with a history of coronary artery  disease.  She is status post PTCA and stenting of her left anterior  descending artery in 2005.  She has had some episodes of chest pain as  well as a mildly abnormal Cardiolite study.  She is admitted now for PCI  of a restenotic LAD lesion.   Sarah Ballard has a history of coronary artery disease dating back 4 years ago.  She had PTCA and stenting of a critical lesion in her LAD.  She has  overall done fairly well.  She presented to the hospital last month with  some episodes of chest pain.  She had a heart catheterization, which  revealed a moderate amount of in-stent restenosis and proximal LAD  followed by a sequential stenosis of approximately 60-70%.  It was not  conclusive that these were ischemic and so she had a stress Cardiolite  study.  The stress Cardiolite study was somewhat abnormal, but it  revealed anterior wall attenuation that appeared to be most consistent  with breast artifact.   The patient has continued to have episodes of chest pain with exertion,  relieved with nitroglycerin.  She is now admitted for further evaluation  and for post PTCA of her LAD.   She denies any syncope or presyncope.  She denies any heat or cold  intolerance, weight gain, or weight loss.   CURRENT MEDICATIONS:  1. Lopressor 25 mg p.o. b.i.d.  2. Synthroid 0.25 mg a day.  3. Plavix 75 mg a day.  4. Aspirin 325 mg a day.  5. Crestor 20 mg a day.  6. Zoloft 100 mg a day.  7. Caltrate once a day.  8. Avapro 150 mg a day.  9. Prilosec 20 mg a day.  10.Colace as needed.   PHYSICAL EXAMINATION:   GENERAL: She is a middle-aged female in no acute  distress.  She is alert and oriented x3.  Mood and affect are normal.  VITAL SIGNS: Her weight is 187, blood pressure is 100/60 with a heart  rate of 66.  HEENT: Reveals 2+ carotids.  NECK: No bruits, no JVD, and no thyromegaly.  LUNGS: Clear to auscultation.  HEART: Regular rate S1 and S2.  ABDOMEN: Reveals good bowel sounds and is nontender.  EXTREMITIES:  She has no clubbing, cyanosis, or edema.  NEURO:  Nonfocal.   Sarah Ballard presents with recurrent episodes of angina.  She has had angina  in the past several weeks, but she really has not wanted to have a heart  catheterization.  We have discussed these results at length on multiple  occasions.  She has multiple other possibilities and she really has not  wanted to have a  heart catheterization because of her fear of it leading  to eventual bypass surgery.  At this point, I think that it is clear  that this moderate LAD stenosis is causing her angina.  We will proceed  with rehabilitation of her LAD stent.  We will evaluate the distal LAD  stenosis to see if that needs a stent as well.  We have discussed the  risks, benefits, and options of heart catheterization.  She understands  and agrees to proceed.  All of her other medical problems are stable.           ______________________________  Vesta Mixer, M.D.     PJN/MEDQ  D:  08/25/2007  T:  08/26/2007  Job:  [id number]   cc:   Gwen Pounds, MD

## 2010-09-12 NOTE — Cardiovascular Report (Signed)
NAMELATICHA, FERRUCCI               ACCOUNT NO.:  [id number]   MEDICAL RECORD NO.:  [id number]          PATIENT TYPE:  INP   LOCATION:  6527                         FACILITY:  [facility]   PHYSICIAN:  Vesta Mixer, M.D. DATE OF BIRTH:  08/14/1955   DATE OF PROCEDURE:  08/27/2007  DATE OF DISCHARGE:                            CARDIAC CATHETERIZATION   Sarah Ballard is a 55 year old female with a history of coronary artery  disease.  She is status post PTCA and stenting of her LAD approximately  4-5 years ago.  She has been having some episodes of angina.  Her chest  pains have been somewhat atypical, but recently have become more and  more typical.  She had a stress Cardiolite study, which was also  borderline.  She did have an anteroapical defect, but it was thought to  be either due to breast attenuation or perhaps ischemia.   She continued to have episodes of chest pain and relieved with  nitroglycerin, and we referred her back for PCI of some in-stent  restenosis.   The procedure was a PCI with a cutting balloon angioplasty to her LAD  stent.   The right femoral artery was easily cannulated using a modified  Seldinger technique.  At the end of the case, a femoral artery angiogram  revealed a nice vessel, and the vessel was closed using Angio-Seal using  the standard technique.   Angiography of the left main was fairly normal.  There is mild-to-  moderate calcification involving the entire left main.   The left anterior descending artery is also mildly calcified.  There is  a slight tortuosity and haziness in the proximal LAD.  There is 60% to  70% in-stent restenosis of the distal aspect of the stent.  The stenosis  extends a little bit outside the distal aspect of the stent.   The patient was given Angiomax.  The ACT was 406.  Plavix 300 mg was  given followed by Pepcid.  A BMW wire was placed down to the distal LAD.   A Flextome with a cutting balloon (2.75 mm x 10 mm) was  passed down  along with a BMW wire and was positioned in the proximal LAD stent.  It  was inflated up to 6 atmospheres for 31 seconds, 10 atmospheres for 27  seconds, and 12 atmospheres for 20 seconds.  This gave [place] a nice  angiographic result with significant improvement.  There is minimal  residual stenosis of approximately 10%.  There is brisk flow.   The patient did have 2/10 chest pain following the procedure.  This  resolved with intracoronary nitroglycerin in about 10-15 minutes of  time.  We took multiple angiograms just to make sure that her vessel was  open, and the flow was quite brisk and normal at all times.  There was  no alteration of the slight haziness of the proximal LAD.   At the end of the case, the right femoral artery was closed using an  Angio-Seal technique as noted above.   COMPLICATIONS:  None.   CONCLUSIONS:  Successful cutting balloon PCI  to the proximal LAD stent.  We will continue with the medical therapy.           ______________________________  Vesta Mixer, M.D.     PJN/MEDQ  D:  08/27/2007  T:  08/28/2007  Job:  [id number]   cc:   [facility]  Gwen Pounds, MD

## 2010-09-12 NOTE — H&P (Signed)
NAMECARLYANN, PLACIDE               ACCOUNT NO.:  [id number]   MEDICAL RECORD NO.:  [id number]          PATIENT TYPE:  INP   LOCATION:  2627                         FACILITY:  [facility]   PHYSICIAN:  Adela Ports, MD   DATE OF BIRTH:  February 17, 1956   DATE OF ADMISSION:  08/02/2007  DATE OF DISCHARGE:                              HISTORY & PHYSICAL   CHIEF COMPLAINT:  Chest pain.   HISTORY OF PRESENT ILLNESS:  Ms. Simone is a 55 year old woman with a  history of coronary artery disease status post PCI with a drug eluting  Taxus stent to an ostial LAD lesion in 2005. She also has a history of  hyperlipidemia, hypotension, hypothyroidism, and menopause. She reports  that she has been having mild to moderate chest pressure off and on for  the past few weeks. She says that generally, this has been coming on  with anxiety or stressful situations or physical exertion but  occasionally she has been having these episodes at rest. They last for  fairly a short amount of time and resolve on their own. She does feel  slightly short of breath when she gets these episodes. She denies  radiation, nausea, vomiting, or diaphoresis with the episodes. She also  admits that she has had some overall decrease in her appetite over the  past few weeks but denies abdominal pain, fevers, or chills.   This evening, after dinner, she developed similar chest pain/pressure  that was more bothersome than previous evenings. She went home and  remained uncomfortable and was unable to go to bed. She tried taking  Rolaids and Xanax without any relief. She also tried taking 1  nitroglycerin, though the date on the bottle had expired. She continued  to have discomfort, so she came to the emergency department where she  received nitroglycerin and morphine with relief of her pain.   PAST MEDICAL HISTORY:  Coronary artery disease with catheterization in  2005 in the setting of chest pain. At that time, she received a drug  eluting (Taxus) to proximal LAD lesion.  1. Hyperlipidemia.  2. Hypertension.  3. Hypothyroidism.  4. Menopause.  5. Anxiety and depression.  6. Degenerative disk disease in the lumbar spine, treated with      epidural injections.   SOCIAL HISTORY:  The patient lives in [place] with her husband, her  son, her father. 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 She has 3  children, 2 of whom live outside of her house. She is a non-smoker and  rarely drinks alcohol.   FAMILY HISTORY:  Notable for a stroke and hypertension in her mother,  who eventually died of colon cancer and myocardial infarction in her  father at the ages of 43 and 6.   ALLERGIES:  ULTRAM (unknown reaction), LIPITOR (myalgias.)   MEDICATIONS:  1. Aspirin 325 mg daily.  2. Avapro 150 mg each morning.  3. Crestor 20 mg daily.  4. Lopressor 25 mg every evening.  5. Multivitamin daily.  6. Clopidogrel 75 mg daily.  7. Synthroid 0.125 mg daily.  8. Xanax as needed,  very rarely used.  9. Zoloft 100 mg daily.  10.Caltrate D plus magnesium daily.  11.Flax seed oil daily.   REVIEW OF SYSTEMS:  Nine system review of systems was documented in the  chart and was negative in detail except as above, except for the  following. The patient reports having myalgias that she feels are  related to her Statins. She had significant cramping and myalgias with  Lipitor and was switched to Crestor 20 mg. Her symptoms have improved  somewhat with this but she continues to have some muscle aches. She also  has menopausal symptoms including hot flashes.   CODE STATUS:  FULL CODE.   PHYSICAL EXAMINATION:  VITAL SIGNS:  Temperature 97, pulse 68,  respiratory rate 18, blood pressure 113/77. Oxygen saturation 96% on  room air.  GENERAL:  Pleasant Caucasian woman in no apparent distress.  HEENT:  Pupils are equal, round, and reactive to light and  accommodation. Extraocular movements intact. Sclera anicteric. Moist  mucous  membranes.  NECK:  Supple without lymphadenopathy or thyromegaly. JVP is not  distended. Carotids 2+ without bruits. There is no lymphadenopathy.  CARDIOVASCULAR:  Regular rate and rhythm. No murmur, rub, or gallop.  Normal S1 and S2.  LUNGS:  Clear to auscultation bilaterally.  SKIN:  No rash or lesions.  ABDOMEN:  Soft, nontender, and nondistended with positive bowel sounds.  No rebound or guarding.  EXTREMITIES:  No clubbing, cyanosis, or edema. Thee are 2+ distal pulses  bilaterally.  MUSCULOSKELETAL:  No joint deformity or effusion or CVA tenderness.  There is mild lower spinal tenderness.  NEUROLOGIC:  Alert and oriented times three. Cranial nerves are grossly  intact. Strength 5/5 in all extremities and there is normal sensation in  the distal extremities.   DIAGNOSTIC STUDIES:  Chest x-ray showed no acute process.   EKG:  Normal sinus rhythm at 70 beats per minute with normal intervals,  axis, and no ischemic changes.   LABORATORY DATA:  White blood cell count 6.2. Creatinine 40.4. Platelets  271,000. Potassium 3.7, BUN 24, creatinine 1.0. CK MB 1.2. Troponin less  than 0.05. Myoglobin 53.   ASSESSMENT/PLAN:  Ms. Kucher is a 55 year old woman with a history of  coronary artery disease and PCI as well as hyperlipidemia and  hypertension, who now returns with chest pain that she feels is similar  to the pain she was having prior to her catheterization and stenting in  2005. She is currently comfortable and chest pain free. We will cycle  her cardiac enzymes to evaluate for myocardial infarction. In the  meantime, will treat her with aspirin, Statin, beta blocker and ace  inhibitor. If she rules in for myocardial infarction, we will add  anticoagulation and  proceed to catheterization. However, if she rules out, it will be  reasonable to proceed with an exercise test and Cardiolite perfusion  study on Monday. In the meantime, will continue her Synthroid and Zoloft  and  admit her to be followed during her rule out.      Adela Ports, MD  Electronically Signed     DWM/MEDQ  D:  08/02/2007  T:  08/02/2007  Job:  [id number]

## 2010-09-12 NOTE — Cardiovascular Report (Signed)
NAMELORALEI, RADCLIFFE               ACCOUNT NO.:  [id number]   MEDICAL RECORD NO.:  [id number]          PATIENT TYPE:  INP   LOCATION:  2627                         FACILITY:  [facility]   PHYSICIAN:  Vesta Mixer, M.D. DATE OF BIRTH:  09/20/1955   DATE OF PROCEDURE:  08/04/2007  DATE OF DISCHARGE:                            CARDIAC CATHETERIZATION   Sarah Ballard is a 55 year old female with a history of coronary artery  disease.  She is status post PTCA and stenting of her proximal LAD 4  years ago.  She presented to the hospital with episodes of chest pain  consistent with her angina.   PROCEDURE:  Left heart catheterization with coronary angiography.   The right femoral artery was easily cannulated using a modified  Seldinger technique.  At the end of the case, angiography showed that  the stick was quite satisfactory, and the vessel was closed using an  AngioSeal device using the standard sterile technique.   FINDINGS:   HEMODYNAMICS:  Left ventricular pressure is 120/17. The aortic pressure  is 118/68.   ANGIOGRAPHY LEFT MAIN:  The left main is moderately calcified.  There is  a calcified shelf on the external aspect of the left main.  There is  approximately 20-30% stenosis associated with this area of  calcification.   The left anterior descending artery is also mildly to moderately  calcified.  The proximal stent is widely patent.  There is a 50%  stenosis just after the stent.  Following that, there is a 50% stenosis  in the distal LAD.  The remainder of the LAD has only minor luminal  irregularities.   The diagonal vessels originate very low on the LAD, and most of that  anterolateral wall is supplied by obtuse marginal vessels.   The distal-most LAD stenosis appears to be associated with some muscular  bridging and may in fact be somewhat of a variable stenosis depending on  her blood pressure.   Left circumflex artery.  The left circumflex artery has minor  luminal  irregularities.  There appears to be a very small first obtuse marginal  artery that comes off and is then shortly occluded.  This distal small  obtuse marginal artery fills via left-to-left collaterals in a  retrograde manner.  This certainly could be the source of her chest  pain.  The remainder of the circumflex artery has minor luminal  irregularities and is otherwise negative.   The right coronary artery is large and is dominant.  There are no  significant irregularities.  The posterior descending artery and the  posterolateral segment artery are unremarkable.   The left ventriculogram was performed in a 30 RAO position.  It reveals  normal left ventricular systolic function.  The ejection fraction is  around 55-60%.   The right femoral artery angiogram reveals normal femoral artery with a  clean puncture site.   COMPLICATIONS:  None.   CONCLUSIONS:  Coronary artery disease involving the proximal left  anterior descending and this first obtuse marginal artery.  The proximal  left anterior descending stent is widely patent, but there is  a 50%  stenosis just after the stent.  Following this, there is a 50% stenosis  in the distal left anterior descending that appears to be associated  with some muscular bridging.  We will do a stress Cardiolite study for  further assessment of these stenoses.   She also has an occlusion of a very small first obtuse marginal vessel.  This vessel is not a candidate for angioplasty because of its extremely  small size.  This certainly could be the cause of some her chest pain.  We will continue with medical therapy.           ______________________________  Vesta Mixer, M.D.     PJN/MEDQ  D:  08/04/2007  T:  08/04/2007  Job:  [id number]   cc:   [facility]

## 2010-09-12 NOTE — Discharge Summary (Signed)
Sarah Ballard, Sarah Ballard               ACCOUNT NO.:  [id number]   MEDICAL RECORD NO.:  [id number]          PATIENT TYPE:  INP   LOCATION:  6527                         FACILITY:  [facility]   PHYSICIAN:  Vesta Mixer, M.D. DATE OF BIRTH:  1955/05/11   DATE OF ADMISSION:  08/27/2007  DATE OF DISCHARGE:  08/28/2007                               DISCHARGE SUMMARY   DISCHARGE DIAGNOSES:  1. Coronary artery disease - status post re-expansion of her proximal      left anterior descending stent.  2. Hyperlipidemia.  3. Anxiety.  4. Hypothyroidism.   DISCHARGE MEDICATIONS:  1. Prilosec 20 mg as needed.  2. Levoxyl 125 mcg a day.  3. Plavix 75 mg a day.  4. Zoloft 100 mg a day.  5. Ecotrin 325 mg a day.  6. Avapro 150 mg a day.  7. Xanax 0.5 mg a day.  8. Lopressor 25 mg a day.  9. Colace 100 mg a day.  10.Crestor 20 mg a day.  11.Caltrate once a day.  12.Nitroglycerin as needed.   DISPOSITION:  The patient will see Dr. Elease Hashimoto in the next several weeks  for a followup visit.   HISTORY:  [name] is a  55 year old female with a history of coronary  artery disease.  She was admitted for heart catheterization.  Please see  dictated H&P for further details.   HOSPITAL COURSE:  1. Coronary artery disease.  The patient was admitted to hospital, had      a heart catheterization.  She was found to have a moderate-to-      severe stenosis in the proximal LAD stent.  The stent was re-      expanded using a 2.75 x 10-mm cutting balloon.  It was inflated up      to 12 atmospheres.  She tolerated the procedure quite well.  She is      now pain free and can tell that she feels a whole lot better.  We      will discharge her on above-noted medications.  All of her other      medical problems are stable.  We will see her in 1-2 weeks.           ______________________________  Vesta Mixer, M.D.     PJN/MEDQ  D:  08/28/2007  T:  08/28/2007  Job:  [id number]

## 2010-09-15 NOTE — Op Note (Signed)
NAMECALAYA, Sarah Ballard               ACCOUNT NO.:  [id number]   MEDICAL RECORD NO.:  [id number]          PATIENT TYPE:  AMB   LOCATION:  DSC                          FACILITY:  [facility]   PHYSICIAN:  Danae Orleans. Venetia Maxon, M.D.  DATE OF BIRTH:  03/14/1956   DATE OF PROCEDURE:  08/28/2006  DATE OF DISCHARGE:                               OPERATIVE REPORT   PREPROCEDURE DIAGNOSIS:  Right L4 radiculopathy with herniated lumbar  disc and degenerative disc disease and spondylosis.   FINAL DIAGNOSIS:  Right L4 radiculopathy with herniated lumbar disc and  degenerative disc disease and spondylosis.   PROCEDURE:  Right L4 selective nerve root block under fluoroscopic  guidance.   SURGEON:  Danae Orleans. Venetia Maxon, M.D.   ANESTHESIA:  Local lidocaine with IV sedation.   COMPLICATIONS:  None.   DISPOSITION:  Recovery.   INDICATIONS FOR PROCEDURE:  [name] is a 55 year old woman with right  leg pain with an L4 radiculopathy.  She has done well with previous  injection therapy and would like for me to perform another injection.   PROCEDURE IN DETAIL:  [name] is brought to the procedure suite.  She  was placed in a prone position on the fluoroscopic table.  After her low  back was prepped and draped in the usual sterile fashion, using  paramedian and oblique trajectory C-arm for visualization, the right L4  pedicle was identified.  The safe zone just inferolateral to the mid  point of the L4 pedicle, and skin and subcutaneous tissues leading to  that, was then infiltrated with local lidocaine and, subsequently, a 3  1/2-inch 22 gauge spinal needle was inserted into this region.  The  epidurogram was obtained with Omnipaque and, subsequently, 2 mL of  Kenalog 40 mg/mL was infiltrated in the epidural space.  The needle was  withdrawn and sterile occlusive dressing was placed.   The patient was taken to recovery having tolerated the procedure well.      Danae Orleans. Venetia Maxon, M.D.  Electronically  Signed     JDS/MEDQ  D:  08/28/2006  T:  08/29/2006  Job:  [id number]

## 2010-09-15 NOTE — Consult Note (Signed)
NAME:  Sarah Ballard, Sarah Ballard                          ACCOUNT NO.:  [id number]   MEDICAL RECORD NO.:  [id number]                   PATIENT TYPE:  EMS   LOCATION:  MINO                                 FACILITY:  [facility]   PHYSICIAN:  Vesta Mixer, M.D.              DATE OF BIRTH:  Jun 04, 1955   DATE OF CONSULTATION:  07/19/2003  DATE OF DISCHARGE:  07/19/2003                                   CONSULTATION   REASON FOR CONSULTATION:  Sarah Ballard is a middle-aged female with a history of  coronary artery disease.  She is status post PTCA and stenting of her left  anterior descending artery last week.  She called me this morning  complaining of right leg pain as well as some right lower quadrant pain.  She was asked to present to the emergency room for further evaluation.  She  stated that she had been weak and pre-syncopal. We decided to bring her in  to evaluate her for the possibility of a retroperitoneal bleed.   Sarah Ballard has not had any episodes of chest pain or shortness of breath.  She  has been able to do most of her normal activities.  She has been slowed down  by her abdominal pain and her leg pain.  She has not noticed any bruising in  her flank.  She has noticed some discoloration of her right femoral artery  site which seems to have become more diffuse over the past several days.   CURRENT MEDICATIONS:  Aspirin and Plavix once a day.  She is also on  Lopressor 25 mg p.o. b.i.d., Synthroid 0.125 mg Mondays through Fridays,  Cytomel 25 micrograms a day, Serzone 200 mg a day, Lipitor 80 mg a day and  Xanax as needed.   PHYSICAL EXAMINATION:  GENERAL:  She is a middle-aged female in no acute  distress.  Her vital signs were completely stable.  Her blood pressure was  110/60.  HEENT:  Reveals 2+ carotids and she has no bruits.  There is no JVD and no  thyromegaly.  LUNGS:  Clear to auscultation.  HEART:  Regular rate and rhythm, S1 and S2 with no murmurs, gallops or rubs.  ABDOMEN:   Reveals good bowel sounds.  She is mildly tender in her right  lower quadrant.  There is no rebound.  Her groin cath site is fairly well  healed.  There is no evidence of a pseudoaneurysm.  There is some ecchymoses  but there is no real hematoma.  Her distal pulses are intact.   We performed a CT scan with and without contrast.  The CT scan revealed a  very small retroperitoneal bleed that is probably responsible for her  episodes of chest pain.  It looks to be self-contained and it does not look  to be severe enough to warrant stopping her Plavix or aspirin.  There was  some question of visualization of  part of her pancreas.  A second CT scan  was performed with oral contrast and it revealed there were no pancreatic  abnormalities.   The patient was incidentally noted to have an ovarian cyst.   The patient was given a prescription for Darvocet-N-100.  We will keep her  at bedrest for the next several days and we will give her pain medications  as needed.  I suspect that the retroperitoneal hematoma will gradually  resolve.  I will see her again in the office in 1-2 weeks.  I have asked her  to call me right away if she has any worsening problems.                                               Vesta Mixer, M.D.    PJN/MEDQ  D:  07/20/2003  T:  07/21/2003  Job:  [id number]   cc:   Gwen Pounds, M.D.  [address]  [place]  [place] [address]  Fax: [phone]

## 2010-11-02 ENCOUNTER — Other Ambulatory Visit: Payer: Self-pay | Admitting: Cardiovascular Disease

## 2010-11-02 MED ORDER — ALPRAZOLAM 0.5 MG PO TABS: 0.5000 mg | ORAL_TABLET | Freq: Three times a day (TID) | ORAL | Status: DC | PRN

## 2010-11-02 NOTE — Telephone Encounter (Signed)
Called into pharmacy, was unable to print, Alfonso Ramus RN

## 2010-11-30 ENCOUNTER — Other Ambulatory Visit: Payer: Self-pay | Admitting: Cardiovascular Disease

## 2010-12-27 ENCOUNTER — Other Ambulatory Visit: Payer: Self-pay | Admitting: Cardiovascular Disease

## 2011-01-17 ENCOUNTER — Other Ambulatory Visit: Payer: Self-pay | Admitting: Cardiovascular Disease

## 2011-01-18 ENCOUNTER — Telehealth: Payer: Self-pay | Admitting: Cardiovascular Disease

## 2011-01-18 NOTE — Telephone Encounter (Signed)
Pt calling c/o of hip and knee pain and wanted to know if it could potentially be the result of long term crestor use. Please return pt call to discuss further. Pt said she already called in refill of medication.   Halfway through the phone call pt said never mind, disregard phone call. Pt said she will just get RX filled and will discuss changing the medication after this refill runs out.

## 2011-01-18 NOTE — Telephone Encounter (Signed)
Ok, will wait till she calls back.

## 2011-01-23 LAB — DIFFERENTIAL
Basophils Absolute: 0
Basophils Relative: 1
Eosinophils Absolute: 0.1
Eosinophils Relative: 2
Neutrophils Relative %: 40 — ABNORMAL LOW

## 2011-01-23 LAB — BASIC METABOLIC PANEL
BUN: 13
Calcium: 8.8
Chloride: 104
Creatinine, Ser: 0.9
GFR calc Af Amer: >60
GFR calc Af Amer: >60
GFR calc non Af Amer: >60
GFR calc non Af Amer: >60
Glucose, Bld: 99
Potassium: 3.9
Sodium: 140

## 2011-01-23 LAB — POCT I-STAT, CHEM 8
HCT: 42
Hemoglobin: 14.3
Potassium: 3.7
Sodium: 138
TCO2: 30

## 2011-01-23 LAB — PROTIME-INR
INR: 0.9
Prothrombin Time: 12.6

## 2011-01-23 LAB — HEPARIN LEVEL (UNFRACTIONATED): Heparin Unfractionated: 0.71 — ABNORMAL HIGH

## 2011-01-23 LAB — APTT: aPTT: 168 — ABNORMAL HIGH

## 2011-01-23 LAB — LIPID PANEL
Cholesterol: 151
HDL: 48
LDL Cholesterol: 85
Total CHOL/HDL Ratio: 3.1

## 2011-01-23 LAB — CBC
HCT: 40.4
HCT: 38.7
Hemoglobin: 13.3
MCV: 92.8
MCV: 93.1
Platelets: 234
Platelets: 271
RBC: 4.39
RBC: 4.18
RDW: 12.5
WBC: 5.6
WBC: 6.2
WBC: 5.1

## 2011-01-23 LAB — POCT CARDIAC MARKERS
CKMB, poc: 1.1
Myoglobin, poc: 53.4
Myoglobin, poc: 64.7
Operator id: 133351
Operator id: 133351
Troponin i, poc: <0.05

## 2011-01-23 LAB — HEMOGLOBIN A1C: Mean Plasma Glucose: 126

## 2011-01-23 LAB — CARDIAC PANEL(CRET KIN+CKTOT+MB+TROPI): CK, MB: 1.7

## 2011-01-23 LAB — CK TOTAL AND CKMB (NOT AT ARMC): Total CK: 130

## 2011-01-23 LAB — TSH: TSH: 1.872

## 2011-01-26 ENCOUNTER — Ambulatory Visit (INDEPENDENT_AMBULATORY_CARE_PROVIDER_SITE_OTHER): Payer: 59 | Admitting: Cardiovascular Disease

## 2011-01-26 ENCOUNTER — Encounter: Payer: Self-pay | Admitting: Cardiovascular Disease

## 2011-01-26 ENCOUNTER — Other Ambulatory Visit (INDEPENDENT_AMBULATORY_CARE_PROVIDER_SITE_OTHER): Payer: 59 | Admitting: *Deleted

## 2011-01-26 VITALS — BP 112/70 | HR 76 | Ht 66.0 in | Wt 189.0 lb

## 2011-01-26 DIAGNOSIS — I2510 Atherosclerotic heart disease of native coronary artery without angina pectoris: Secondary | ICD-10-CM

## 2011-01-26 LAB — LIPID PANEL
Cholesterol: 132 mg/dL (ref 0–200)
LDL Cholesterol: 71 mg/dL (ref 0–99)
Total CHOL/HDL Ratio: 3
VLDL: 8.2 mg/dL (ref 0.0–40.0)

## 2011-01-26 LAB — HEPATIC FUNCTION PANEL
Alkaline Phosphatase: 70 U/L (ref 39–117)
Bilirubin, Direct: 0.1 mg/dL (ref 0.0–0.3)
Total Protein: 6.4 g/dL (ref 6.0–8.3)

## 2011-01-26 LAB — BASIC METABOLIC PANEL
BUN: 18 mg/dL (ref 6–23)
CO2: 25 mEq/L (ref 19–32)
Chloride: 107 mEq/L (ref 96–112)
Creatinine, Ser: 0.7 mg/dL (ref 0.4–1.2)
Glucose, Bld: 91 mg/dL (ref 70–99)
Potassium: 4.6 mEq/L (ref 3.5–5.1)

## 2011-01-26 NOTE — Progress Notes (Signed)
Sarah Ballard Date of Birth  1956-03-09 [facility] [address]    [address] [place], [place]  [address] [phone]  Fax  [phone]  History of Present Illness:  The 55 yo  female with a history of coronary artery disease. She's status post PTCA and stenting of her left anterior descending artery. She also had Cutting Balloon procedure and reexpansion of that stent in January 2010.    He is walking twice a week. She's not having episodes of angina with walking. 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  Current Outpatient Prescriptions on File Prior to Visit  Medication Sig Dispense Refill  . ALPRAZolam (XANAX) 0.5 MG tablet Take 1 tablet (0.5 mg total) by mouth 3 (three) times daily as needed for anxiety.  40 tablet  5  . aspirin 81 MG tablet Take 81 mg by mouth daily.        . AVAPRO 150 MG tablet TAKE 1 TABLET BY MOUTH DAILY  90 tablet  1  . Calcium Carbonate-Vitamin D (CALTRATE 600+D) 600-400 MG-UNIT per tablet Take 1 tablet by mouth daily.        . CRESTOR 20 MG tablet TAKE 1 TABLET BY MOUTH DAILY  90 tablet  PRN  . docusate sodium (COLACE) 100 MG capsule Take 100 mg by mouth as needed.        Marland Kitchen levothyroxine (SYNTHROID, LEVOTHROID) 125 MCG tablet Take 125 mcg by mouth daily. 1 TABLET DAILY, 1/2 TABLET ON SUNDAY       . metoprolol (LOPRESSOR) 50 MG tablet TAKE 1/2 TABLET BY MOUTH 2 TIMES A DAY  180 tablet  PRN  . nitroGLYCERIN (NITROSTAT) 0.4 MG SL tablet Place 0.4 mg under the tongue every 5 (five) minutes as needed.        Marland Kitchen omega-3 acid ethyl esters (LOVAZA) 1 G capsule Take 2 g by mouth 2 (two) times daily.        Marland Kitchen PLAVIX 75 MG tablet TAKE 1 TABLET BY MOUTH DAILY  90 tablet  3  . sertraline (ZOLOFT) 50 MG tablet Take 1 tablet (50 mg total) by mouth daily.  30 tablet  11  . metoprolol (LOPRESSOR) 25 MG tablet Take 25 mg by mouth 2 (two) times daily.          Allergies  Allergen Reactions  . Latex   .  Lisinopril Cough  . Tramadol Hcl     REACTION: nausea, vomiting    Past Medical History  Diagnosis Date  . CAD (coronary artery disease)   . Hypothyroidism   . Hyperlipemia   . Hypertension   . Anxiety   . Degenerative disc disease     Past Surgical History  Procedure Date  . Coronary angioplasty   . Nerve root block     History  Smoking status  . Never Smoker   Smokeless tobacco  . Not on file    History  Alcohol Use  . Yes    occ wine    Family History  Problem Relation Age of Onset  . Colon cancer Mother   . Stroke Mother   . Hypertension Mother   . Heart attack Father     x2    Reviw of Systems:  Reviewed in the HPI.  All other systems are negative.  Physical Exam: BP 112/70  Pulse 76  Ht 5\' 6"  (1.676 m)  Wt 189 lb (85.73 kg)  BMI 30.51 kg/m2 The patient is  alert and oriented x 3.  The mood and affect are normal.   Skin: warm and dry.  Color is normal.    HEENT:   the sclera are nonicteric.  The mucous membranes are moist.  The carotids are 2+ without bruits.  There is no thyromegaly.  There is no JVD.    Lungs: clear.  The chest wall is non tender.    Heart: regular rate with a normal S1 and S2.  There are no murmurs, gallops, or rubs. The PMI is not displaced.     Abdomen: good bowel sounds.  There is no guarding or rebound.  There is no hepatosplenomegaly or tenderness.  There are no masses.   Extremities:  no clubbing, cyanosis, or edema.  The legs are without rashes.  The distal pulses are intact.   Neuro:  Cranial nerves II - XII are intact.  Motor and sensory functions are intact.    The gait is normal.   Assessment / Plan:

## 2011-01-26 NOTE — Assessment & Plan Note (Deleted)
She is very stable.  No angina.  Continue current meds and exercise.   Ov and labs in 6 months

## 2011-01-26 NOTE — Patient Instructions (Signed)
Continue to exercise. Stay on current meds.

## 2011-01-26 NOTE — Assessment & Plan Note (Signed)
She is very stable.  No angina.  Continue current meds and exercise.   Ov and labs in 6 months.  I've encouraged her to continue to try to get more exercise.

## 2011-02-20 LAB — HM MAMMOGRAPHY: HM Mammogram: NEGATIVE

## 2011-03-20 ENCOUNTER — Other Ambulatory Visit: Payer: Self-pay | Admitting: Cardiovascular Disease

## 2011-03-29 ENCOUNTER — Telehealth: Payer: Self-pay | Admitting: *Deleted

## 2011-03-29 MED ORDER — ALPRAZOLAM 0.5 MG PO TABS: 0.5000 mg | ORAL_TABLET | Freq: Three times a day (TID) | ORAL | Status: DC | PRN

## 2011-03-29 NOTE — Telephone Encounter (Signed)
Called in xanax 30 r1

## 2011-05-29 ENCOUNTER — Other Ambulatory Visit: Payer: Self-pay | Admitting: Cardiovascular Disease

## 2011-06-01 ENCOUNTER — Other Ambulatory Visit: Payer: Self-pay | Admitting: *Deleted

## 2011-06-04 ENCOUNTER — Other Ambulatory Visit: Payer: Self-pay | Admitting: *Deleted

## 2011-06-04 MED ORDER — ALPRAZOLAM 0.5 MG PO TABS: 0.5000 mg | ORAL_TABLET | Freq: Three times a day (TID) | ORAL | Status: DC | PRN

## 2011-06-04 NOTE — Telephone Encounter (Signed)
Called script in to pharmacy per dr Clent Ridges.

## 2011-06-06 ENCOUNTER — Other Ambulatory Visit: Payer: Self-pay | Admitting: Cardiovascular Disease

## 2011-06-26 ENCOUNTER — Inpatient Hospital Stay (HOSPITAL_COMMUNITY)
Admission: EM | Admit: 2011-06-26 | Discharge: 2011-06-28 | DRG: 287 | Disposition: A | Discharge status: Home / Self Care | Payer: 59 | Attending: Cardiovascular Disease | Admitting: Cardiovascular Disease

## 2011-06-26 ENCOUNTER — Other Ambulatory Visit: Payer: Self-pay

## 2011-06-26 ENCOUNTER — Encounter (HOSPITAL_COMMUNITY): Payer: Self-pay | Admitting: *Deleted

## 2011-06-26 ENCOUNTER — Emergency Department (HOSPITAL_COMMUNITY): Payer: 59

## 2011-06-26 DIAGNOSIS — F329 Major depressive disorder, single episode, unspecified: Secondary | ICD-10-CM | POA: Diagnosis present

## 2011-06-26 DIAGNOSIS — E039 Hypothyroidism, unspecified: Secondary | ICD-10-CM | POA: Diagnosis present

## 2011-06-26 DIAGNOSIS — F411 Generalized anxiety disorder: Secondary | ICD-10-CM | POA: Diagnosis present

## 2011-06-26 DIAGNOSIS — E785 Hyperlipidemia, unspecified: Secondary | ICD-10-CM | POA: Diagnosis present

## 2011-06-26 DIAGNOSIS — Z9861 Coronary angioplasty status: Secondary | ICD-10-CM

## 2011-06-26 DIAGNOSIS — Z79899 Other long term (current) drug therapy: Secondary | ICD-10-CM

## 2011-06-26 DIAGNOSIS — Z7982 Long term (current) use of aspirin: Secondary | ICD-10-CM

## 2011-06-26 DIAGNOSIS — Z7902 Long term (current) use of antithrombotics/antiplatelets: Secondary | ICD-10-CM

## 2011-06-26 DIAGNOSIS — M79609 Pain in unspecified limb: Secondary | ICD-10-CM | POA: Diagnosis present

## 2011-06-26 DIAGNOSIS — I2584 Coronary atherosclerosis due to calcified coronary lesion: Secondary | ICD-10-CM | POA: Diagnosis present

## 2011-06-26 DIAGNOSIS — F3289 Other specified depressive episodes: Secondary | ICD-10-CM | POA: Diagnosis present

## 2011-06-26 DIAGNOSIS — I2510 Atherosclerotic heart disease of native coronary artery without angina pectoris: Secondary | ICD-10-CM | POA: Diagnosis present

## 2011-06-26 DIAGNOSIS — IMO0002 Reserved for concepts with insufficient information to code with codable children: Secondary | ICD-10-CM | POA: Diagnosis present

## 2011-06-26 DIAGNOSIS — M129 Arthropathy, unspecified: Secondary | ICD-10-CM | POA: Diagnosis present

## 2011-06-26 DIAGNOSIS — I10 Essential (primary) hypertension: Secondary | ICD-10-CM | POA: Diagnosis present

## 2011-06-26 DIAGNOSIS — R0789 Other chest pain: Principal | ICD-10-CM | POA: Diagnosis present

## 2011-06-26 LAB — BASIC METABOLIC PANEL
Calcium: 9.4 mg/dL (ref 8.4–10.5)
Creatinine, Ser: 0.66 mg/dL (ref 0.50–1.10)
GFR calc Af Amer: >90 mL/min (ref >90)
Sodium: 132 mEq/L — ABNORMAL LOW (ref 135–145)

## 2011-06-26 LAB — CBC
MCH: 27.5 pg (ref 26.0–34.0)
MCV: 80.6 fL (ref 78.0–100.0)
Platelets: 250 10*3/uL (ref 150–400)
RDW: 15.9 % — ABNORMAL HIGH (ref 11.5–15.5)
WBC: 8.7 10*3/uL (ref 4.0–10.5)

## 2011-06-26 LAB — POCT I-STAT TROPONIN I: Troponin i, poc: 0 ng/mL (ref 0.00–0.08)

## 2011-06-26 MED ORDER — SODIUM CHLORIDE 0.9 % IV SOLN
Freq: Once | INTRAVENOUS | Status: AC
  Administered 2011-06-26: 22:00:00 via INTRAVENOUS

## 2011-06-26 MED ORDER — NITROGLYCERIN 2 % TD OINT
1.0000 [in_us] | TOPICAL_OINTMENT | Freq: Once | TRANSDERMAL | Status: AC
  Administered 2011-06-26: 1 [in_us] via TOPICAL
  Filled 2011-06-26: qty 1

## 2011-06-26 MED ORDER — MORPHINE SULFATE 4 MG/ML IJ SOLN
6.0000 mg | Freq: Once | INTRAMUSCULAR | Status: AC
  Administered 2011-06-26: 6 mg via INTRAVENOUS
  Filled 2011-06-26: qty 2

## 2011-06-26 MED ORDER — ONDANSETRON HCL 4 MG/2ML IJ SOLN
4.0000 mg | Freq: Once | INTRAMUSCULAR | Status: AC
  Administered 2011-06-26: 4 mg via INTRAVENOUS
  Filled 2011-06-26: qty 2

## 2011-06-26 MED ORDER — SODIUM CHLORIDE 0.9 % IV SOLN
INTRAVENOUS | Status: DC
  Administered 2011-06-27 (×2): via INTRAVENOUS

## 2011-06-26 MED ORDER — ASPIRIN 81 MG PO CHEW
324.0000 mg | CHEWABLE_TABLET | Freq: Once | ORAL | Status: AC
  Administered 2011-06-26: 243 mg via ORAL
  Filled 2011-06-26: qty 3

## 2011-06-26 MED ORDER — ACETAMINOPHEN 325 MG PO TABS
650.0000 mg | ORAL_TABLET | ORAL | Status: DC | PRN
  Administered 2011-06-27 (×2): 650 mg via ORAL
  Filled 2011-06-26 (×2): qty 2

## 2011-06-26 MED ORDER — ASPIRIN 325 MG PO TABS: 325.0000 mg | ORAL_TABLET | ORAL | Status: DC

## 2011-06-26 NOTE — ED Notes (Signed)
Dr.Knapp at bedside  

## 2011-06-26 NOTE — ED Notes (Signed)
Patient currently resting quietly in bed; no respiratory or acute distress noted.  Patient updated on plan of care; informed patient that the EDP has made a consult to cardiology and that she will be moved to CDU until admitted. Patient has no other questions or concerns at this time; will continue to monitor.

## 2011-06-26 NOTE — ED Notes (Signed)
Patient reports she went off of her plavix x 1 week  for epidural  She has been back on her med for 1 week. Patient states she gave blood today and felt fine until 2 hours ago.  Patient states she has tried nitro x 2, xanax, and tums w/only some relief.  Patient has hx of cardiac stent.  She states she has noted increasing weakness and sob during the day today

## 2011-06-26 NOTE — ED Notes (Addendum)
Patient complaining of chest pain that started this afternoon. Patient states that she gave blood around 1500 this afternoon; patient reports dizzy spells and intermittent chest pain over the course of the day after she gave blood.  Patient took nitro around 1615 and another a 1625.  Describes chest pain as "heaviness"; rates pain 5/10 on the numerical pain scale.  Describes location of chest pain as "left sternal boarder".  Patient also states that she took Xanax this evening to help calm herself down.  Patient states that the pain has eased up since being here; reports radiation of pain into her left arm and jaw.  Patient does report cardiac stent and coronary artery disease.  Patient is on plavix and baby aspirin daily.  Patient alert and oriented x4; PERRL present.  Upon arrival to room, patient changed into gown and patient connected to continuous cardiac, pulse ox, and blood pressure monitor.  Family present at bedside.  Will continue to monitor.

## 2011-06-26 NOTE — H&P (Addendum)
Admit date: 06/26/2011 Referring Physician  Dr. Lynelle Doctor  Primary Cardiologist  Dr. Elease Hashimoto Chief complaint/reason for admission:Chest pain  HPI: This is a [phone] WF with a history of CAD s/p PCI 2005 and then PTCA in 2009, HTN, dyslipidemia who was in her USOH until today around 3:30am when she was carrying groceries into the house.  When she got home the chest heaviness got worse.  She laid down and took a SL NTG x2 at 4:15 without any improvement.  She was SOB but denied any diaphoresis or nausea.  She took TUMS without relief.  She took some xanax without any improvement.  When she arrived in the ER she was given baby ASA x3 and morphine which improved her pain now to a 2/10.  The pain is midsternal and she has also had bilateral jaw pain which is still there to a very mild degree.  Her left arm was also aching with the pain which she has never had with her angina in the past but has improved with morphine.    PMH:    Past Medical History  Diagnosis Date  . CAD (coronary artery disease) 2005    s/p PCI   . Hypothyroidism   . Hyperlipemia   . Hypertension   . Anxiety   . Degenerative disc disease     PSH:    Past Surgical History  Procedure Date  . Cutting balloon angioplasty with reexpansion of LAD stent 04/2008  . Nerve root block   . Coronary angioplasty with stent placement LAD 2005  . Tubal ligation     ALLERGIES:   Latex; Lisinopril; and Tramadol hcl  Prior to Admit Meds:   (Not in a hospital admission) Family HX:    Family History  Problem Relation Age of Onset  . Colon cancer Mother   . Stroke Mother   . Hypertension Mother   . Heart attack Father     x2   Social HX:    History   Social History  . Marital Status: Married    Spouse Name: N/A    Number of Children: N/A  . Years of Education: N/A   Occupational History  . Not on file.   Social History Main Topics  . Smoking status: Never Smoker   . Smokeless tobacco: Not on file  . Alcohol Use: Yes     occ wine   . Drug Use: No  . Sexually Active: Not on file   Other Topics Concern  . Not on file   Social History Narrative  . No narrative on file     ROS:  All 11 ROS were addressed and are negative except what is stated in the HPI  PHYSICAL EXAM Filed Vitals:   06/26/11 2000  BP: 129/78  Pulse: 86  Temp: 97.3 F (36.3 C)  Resp: 20   General: Well developed, well nourished, in no acute distress Head: Eyes PERRLA, No xanthomas.   Normal cephalic and atramatic  Lungs:   Clear bilaterally to auscultation and percussion. Heart:   HRRR S1 S2 Pulses are 2+ & equal.            No carotid bruit. No JVD.  No abdominal bruits. No femoral bruits. Abdomen: Bowel sounds are positive, abdomen soft and non-tender without masses or                  Hernia's noted. Msk:  Back normal, normal gait. Normal strength and tone for age. Extremities:   No  clubbing, cyanosis or edema.  DP +1 Neuro: Alert and oriented X 3. Psych:  Good affect, responds appropriately   Labs:   Lab Results  Component Value Date   WBC 8.7 06/26/2011   HGB 12.3 06/26/2011   HCT 36.1 06/26/2011   MCV 80.6 06/26/2011   PLT 250 06/26/2011    Lab 06/26/11 1837  NA 132*  K 3.7  CL 100  CO2 20  BUN 17  CREATININE 0.66  CALCIUM 9.4  PROT --  BILITOT --  ALKPHOS --  ALT --  AST --  GLUCOSE 120*   Lab Results  Component Value Date   CKTOTAL 123 08/02/2007   CKMB 1.7 08/02/2007   TROPONINI  Value: 0.01        NO INDICATION OF MYOCARDIAL INJURY. 08/02/2007   No results found for this basename: PTT   Lab Results  Component Value Date   INR 0.9 08/03/2007     Lab Results  Component Value Date   CHOL 132 01/26/2011   CHOL 151 07/27/2010   CHOL 184 01/20/2010   Lab Results  Component Value Date   HDL 52.60 01/26/2011   HDL 62.13 07/27/2010   HDL 65 01/20/2010   Lab Results  Component Value Date   LDLCALC 71 01/26/2011   LDLCALC 85 07/27/2010   LDLCALC 102 01/20/2010   Lab Results  Component Value Date   TRIG 41.0  01/26/2011   TRIG 78.0 07/27/2010   TRIG 81 01/20/2010   Lab Results  Component Value Date   CHOLHDL 3 01/26/2011   CHOLHDL 3 07/27/2010   CHOLHDL 3.1 08/02/2007   No results found for this basename: LDLDIRECT      Radiology:  *RADIOLOGY REPORT*  Clinical Data: Chest pain. Coronary artery disease. Hypertension.  CHEST - 2 VIEW  Comparison: 08/28/2010  Findings: The heart size and mediastinal contours are within  normal limits. Both lungs are clear. The visualized skeletal  structures are unremarkable.  IMPRESSION:  No active cardiopulmonary disease.  Original Report Authenticated By: Danae Orleans, M.D.   EKG:  NSR with no ST changes  ASSESSMENT:  1.  USAP with negative troponin and normal EKG.  Currently with 1/10 chest pain.  She stopped her Plavix for 1 week for an epidural injection but restarted her Plavix back on Feb 19th 2.  CAD with history of PCI of LAD 2005 and cutting balloon angioplasty with reexpansion of stent 04/2008 3.  HTN 4.  Dyslipidemia 5.  Anxiety 6.  Hypothyroidism  PLAN:   1.  Admit to telemetry 2.  Cycle cardiac enzymes 3.  IV NTG gtt 4.  IV Heparin gtt 5.  ASA/Plavix/statin 6.  NPO after midnight 7.  Probable cath in am  Quintella Reichert, MD  06/26/2011  10:53 PM

## 2011-06-26 NOTE — ED Notes (Signed)
Lab asked about i-Stat troponin (order was not checked off; results not back).  Lab tech now at bedside.

## 2011-06-26 NOTE — ED Provider Notes (Signed)
History     CSN: [id number]  Arrival date & time 06/26/11  1826   First MD Initiated Contact with Patient 06/26/11 2038      Chief Complaint  Patient presents with  . Chest Pain  . Weakness    (Consider location/radiation/quality/duration/timing/severity/associated sxs/prior treatment) HPI Comments: She developed chest pain with shortness of breath and weakness about 4:30 this afternoon while shopping it hard time carrying her groceries back to the house.  She took 2 sublingual nitroglycerin's with minimal relief.  On return home.  She also took is seen extending that this might be an anxiety attack.  The pain and shortness of breath have not subsided.  She does have a significant cardiac history with 2 cardiac stents last looked at 38 and half years ago by Dr. Buzzy Han  at that time, which she was told one of them was starting to clot off.  She has been taking Plavix on a regular basis until 2 weeks ago, when she stopped for one week with her physician's permission to get an epidural injection for chronic low back pain.  She has subsequently started the Plavix one week ago  Patient is a 56 y.o. female presenting with chest pain and weakness. The history is provided by the patient.  Chest Pain The chest pain began 3 - 5 hours ago. Chest pain occurs constantly. The chest pain is improving. The pain is associated with exertion. At its most intense, the pain is at 6/10. The pain is currently at 4/10. The severity of the pain is moderate. The quality of the pain is described as heavy. The pain does not radiate. Primary symptoms include shortness of breath. Pertinent negatives for primary symptoms include no fever and no dizziness.  Associated symptoms include weakness.    Weakness Primary symptoms do not include dizziness or fever.  Additional symptoms include weakness.    Past Medical History  Diagnosis Date  . CAD (coronary artery disease)   . Hypothyroidism   . Hyperlipemia   .  Hypertension   . Anxiety   . Degenerative disc disease     Past Surgical History  Procedure Date  . Coronary angioplasty   . Nerve root block   . Coronary angioplasty with stent placement   . Tubal ligation     Family History  Problem Relation Age of Onset  . Colon cancer Mother   . Stroke Mother   . Hypertension Mother   . Heart attack Father     x2    History  Substance Use Topics  . Smoking status: Never Smoker   . Smokeless tobacco: Not on file  . Alcohol Use: Yes     occ wine    OB History    Grav Para Term Preterm Abortions TAB SAB Ect Mult Living                  Review of Systems  Constitutional: Negative for fever.  Respiratory: Positive for shortness of breath.   Cardiovascular: Positive for chest pain.  Neurological: Positive for weakness. Negative for dizziness.    Allergies  Latex; Lisinopril; and Tramadol hcl  Home Medications   Current Outpatient Rx  Name Route Sig Dispense Refill  . ALPRAZOLAM 0.5 MG PO TABS Oral Take 0.5 mg by mouth 2 (two) times daily. Take twice daily per patient    . ASPIRIN 81 MG PO TABS Oral Take 81 mg by mouth daily.      Marland Kitchen CALCIUM CARBONATE-VITAMIN D 600-400 MG-UNIT  PO TABS Oral Take 1 tablet by mouth daily.      . CRESTOR 20 MG PO TABS  TAKE 1 TABLET BY MOUTH DAILY 90 tablet PRN  . DOCUSATE SODIUM 100 MG PO CAPS Oral Take 100 mg by mouth as needed.      Marland Kitchen FLUTICASONE PROPIONATE 50 MCG/ACT NA SUSP Nasal Place 2 sprays into the nose as needed. For allergy     . HYDROCORTISONE 1 % EX CREA Topical Apply 1 application topically 2 (two) times daily. For Dermitis    . IBUPROFEN 200 MG PO TABS Oral Take 200 mg by mouth every 6 (six) hours as needed. For pain    . IRBESARTAN 150 MG PO TABS  TAKE 1 TABLET BY MOUTH DAILY 90 tablet 2  . LEVOTHYROXINE SODIUM 125 MCG PO TABS Oral Take 125 mcg by mouth daily. 1 TABLET DAILY, 1/2 TABLET ON Sunday.    Marland Kitchen METOPROLOL TARTRATE 25 MG PO TABS Oral Take 25 mg by mouth 2 (two) times daily.       Marland Kitchen NITROGLYCERIN 0.4 MG SL SUBL Sublingual Place 0.4 mg under the tongue every 5 (five) minutes as needed. For chest pain    . OMEGA-3-ACID ETHYL ESTERS 1 G PO CAPS Oral Take 2 g by mouth 2 (two) times daily.      Marland Kitchen PLAVIX 75 MG PO TABS  TAKE 1 TABLET BY MOUTH DAILY 90 tablet 3  . POLYETHYLENE GLYCOL 3350 PO PACK Oral Take 17 g by mouth daily as needed. For constipation    . SOLIFENACIN SUCCINATE 5 MG PO TABS Oral Take 10 mg by mouth daily.      BP 129/78  Pulse 86  Temp(Src) 97.3 F (36.3 C) (Oral)  Resp 20  Ht 5\' 6"  (1.676 m)  Wt 180 lb (81.647 kg)  BMI 29.05 kg/m2  SpO2 100%  Physical Exam  Constitutional: She is oriented to person, place, and time. She appears well-developed and well-nourished.  HENT:  Head: Normocephalic.  Cardiovascular: Normal rate and regular rhythm.   Pulmonary/Chest: Effort normal. She has no wheezes.  Abdominal: Soft.  Musculoskeletal: Normal range of motion.  Neurological: She is alert and oriented to person, place, and time.  Skin: Skin is warm.    ED Course  Procedures (including critical care time)  Labs Reviewed  CBC - Abnormal; Notable for the following:    RDW 15.9 (*)    All other components within normal limits  BASIC METABOLIC PANEL - Abnormal; Notable for the following:    Sodium 132 (*)    Glucose, Bld 120 (*)    All other components within normal limits  POCT I-STAT TROPONIN I   Dg Chest 2 View  06/26/2011  *RADIOLOGY REPORT*  Clinical Data: Chest pain.  Coronary artery disease.  Hypertension.  CHEST - 2 VIEW  Comparison:  08/28/2010  Findings:  The heart size and mediastinal contours are within normal limits.  Both lungs are clear.  The visualized skeletal structures are unremarkable.  IMPRESSION: No active cardiopulmonary disease.  Original Report Authenticated By: Danae Orleans, M.D.     No diagnosis found.    MDM  This patient's cardiac history, and the fact that her stents were starting to clot off 3-1/2 years ago  and today's episode of shortness of breath, chest pain, and weakness.  I feel that it would be in this patient's best interest to be admitted.  I spoke with Dr. Iantha Fallen he will contact Dr. Laray Anger on for admission  Arman Filter, NP 06/26/11 2131

## 2011-06-26 NOTE — ED Provider Notes (Signed)
Patient presents with recurrent chest pain. She has known history of coronary artery disease status post stenting.  I discussed the findings with the patient. She agrees with admission to the hospital for further evaluation. At this time she is in no distress.  Celene Kras, MD 06/26/11 [phone]

## 2011-06-26 NOTE — ED Notes (Signed)
Patient currently sitting up in bed; no respiratory or acute distress noted.  Patient updated on plan of care; informed patient that we are currently waiting for NP to come and talk to patient about lab results.  Patient has no other questions or concerns at this time; will continue to monitor.

## 2011-06-27 ENCOUNTER — Other Ambulatory Visit: Payer: Self-pay

## 2011-06-27 ENCOUNTER — Encounter (HOSPITAL_COMMUNITY)
Admission: EM | Disposition: A | Discharge status: Home / Self Care | Payer: Self-pay | Source: Home / Self Care | Attending: Cardiovascular Disease

## 2011-06-27 DIAGNOSIS — I2510 Atherosclerotic heart disease of native coronary artery without angina pectoris: Secondary | ICD-10-CM

## 2011-06-27 HISTORY — PX: LEFT HEART CATHETERIZATION WITH CORONARY ANGIOGRAM: SHX5451

## 2011-06-27 LAB — BASIC METABOLIC PANEL
BUN: 15 mg/dL (ref 6–23)
CO2: 28 mEq/L (ref 19–32)
Chloride: 108 mEq/L (ref 96–112)
Creatinine, Ser: 0.82 mg/dL (ref 0.50–1.10)

## 2011-06-27 LAB — CBC
HCT: 31.7 % — ABNORMAL LOW (ref 36.0–46.0)
Hemoglobin: 10.7 g/dL — ABNORMAL LOW (ref 12.0–15.0)
MCH: 27.2 pg (ref 26.0–34.0)
MCHC: 33.8 g/dL (ref 30.0–36.0)
MCV: 82.1 fL (ref 78.0–100.0)
Platelets: 197 10*3/uL (ref 150–400)
Platelets: 205 10*3/uL (ref 150–400)
RBC: 3.92 MIL/uL (ref 3.87–5.11)
RDW: 16.1 % — ABNORMAL HIGH (ref 11.5–15.5)
RDW: 16.4 % — ABNORMAL HIGH (ref 11.5–15.5)
WBC: 5.8 10*3/uL (ref 4.0–10.5)

## 2011-06-27 LAB — PLATELET INHIBITION P2Y12: Platelet Function  P2Y12: 186 [PRU] — ABNORMAL LOW (ref 194–418)

## 2011-06-27 LAB — CARDIAC PANEL(CRET KIN+CKTOT+MB+TROPI)
CK, MB: 2.1 ng/mL (ref 0.3–4.0)
Relative Index: INVALID (ref 0.0–2.5)
Relative Index: INVALID (ref 0.0–2.5)
Troponin I: <0.3 ng/mL (ref <0.30)
Troponin I: <0.3 ng/mL (ref <0.30)

## 2011-06-27 LAB — COMPREHENSIVE METABOLIC PANEL
ALT: 22 U/L (ref 0–35)
AST: 17 U/L (ref 0–37)
Calcium: 8.4 mg/dL (ref 8.4–10.5)
Sodium: 137 mEq/L (ref 135–145)
Total Protein: 5.7 g/dL — ABNORMAL LOW (ref 6.0–8.3)

## 2011-06-27 LAB — PROTIME-INR: INR: 1.18 (ref 0.00–1.49)

## 2011-06-27 LAB — APTT: aPTT: 30 seconds (ref 24–37)

## 2011-06-27 LAB — DIFFERENTIAL
Basophils Absolute: 0 10*3/uL (ref 0.0–0.1)
Eosinophils Absolute: 0 10*3/uL (ref 0.0–0.7)
Eosinophils Relative: 0 % (ref 0–5)

## 2011-06-27 SURGERY — LEFT HEART CATHETERIZATION WITH CORONARY ANGIOGRAM
Anesthesia: Moderate Sedation | Laterality: Bilateral

## 2011-06-27 MED ORDER — SODIUM CHLORIDE 0.9 % IJ SOLN
3.0000 mL | Freq: Two times a day (BID) | INTRAMUSCULAR | Status: DC
  Administered 2011-06-27 (×2): 3 mL via INTRAVENOUS

## 2011-06-27 MED ORDER — LEVOTHYROXINE SODIUM 125 MCG PO TABS
125.0000 ug | ORAL_TABLET | Freq: Every day | ORAL | Status: DC
  Administered 2011-06-27 – 2011-06-28 (×2): 125 ug via ORAL
  Filled 2011-06-27 (×2): qty 1

## 2011-06-27 MED ORDER — IRBESARTAN 150 MG PO TABS
150.0000 mg | ORAL_TABLET | Freq: Every day | ORAL | Status: DC
  Administered 2011-06-27 – 2011-06-28 (×2): 150 mg via ORAL
  Filled 2011-06-27 (×2): qty 1

## 2011-06-27 MED ORDER — ASPIRIN EC 81 MG PO TBEC
81.0000 mg | DELAYED_RELEASE_TABLET | Freq: Every day | ORAL | Status: DC
  Administered 2011-06-27 – 2011-06-28 (×2): 81 mg via ORAL
  Filled 2011-06-27 (×2): qty 1

## 2011-06-27 MED ORDER — ASPIRIN 81 MG PO CHEW: 324.0000 mg | CHEWABLE_TABLET | ORAL | Status: DC

## 2011-06-27 MED ORDER — NITROGLYCERIN IN D5W 200-5 MCG/ML-% IV SOLN
5.0000 ug/min | INTRAVENOUS | Status: DC
  Administered 2011-06-27: 5 ug/min via INTRAVENOUS

## 2011-06-27 MED ORDER — SODIUM CHLORIDE 0.9 % IV SOLN: INTRAVENOUS | Status: AC

## 2011-06-27 MED ORDER — CLOPIDOGREL BISULFATE 75 MG PO TABS
75.0000 mg | ORAL_TABLET | Freq: Once | ORAL | Status: DC
  Filled 2011-06-27: qty 1

## 2011-06-27 MED ORDER — DARIFENACIN HYDROBROMIDE ER 7.5 MG PO TB24
7.5000 mg | ORAL_TABLET | Freq: Every day | ORAL | Status: DC
  Administered 2011-06-27 – 2011-06-28 (×2): 7.5 mg via ORAL
  Filled 2011-06-27 (×2): qty 1

## 2011-06-27 MED ORDER — DIAZEPAM 5 MG PO TABS
5.0000 mg | ORAL_TABLET | ORAL | Status: AC
  Administered 2011-06-27: 5 mg via ORAL
  Filled 2011-06-27: qty 1

## 2011-06-27 MED ORDER — HEPARIN (PORCINE) IN NACL 2-0.9 UNIT/ML-% IJ SOLN
INTRAMUSCULAR | Status: AC
  Filled 2011-06-27: qty 2000

## 2011-06-27 MED ORDER — MIDAZOLAM HCL 2 MG/2ML IJ SOLN
INTRAMUSCULAR | Status: AC
  Filled 2011-06-27: qty 2

## 2011-06-27 MED ORDER — METOPROLOL TARTRATE 25 MG PO TABS
25.0000 mg | ORAL_TABLET | Freq: Two times a day (BID) | ORAL | Status: DC
  Administered 2011-06-27 – 2011-06-28 (×2): 25 mg via ORAL
  Filled 2011-06-27 (×5): qty 1

## 2011-06-27 MED ORDER — ALPRAZOLAM 0.5 MG PO TABS
0.5000 mg | ORAL_TABLET | Freq: Two times a day (BID) | ORAL | Status: DC
  Administered 2011-06-27 – 2011-06-28 (×3): 0.5 mg via ORAL
  Filled 2011-06-27 (×2): qty 1

## 2011-06-27 MED ORDER — IBUPROFEN 200 MG PO TABS
200.0000 mg | ORAL_TABLET | Freq: Four times a day (QID) | ORAL | Status: DC | PRN
  Administered 2011-06-27 – 2011-06-28 (×2): 200 mg via ORAL
  Filled 2011-06-27 (×2): qty 1

## 2011-06-27 MED ORDER — SODIUM CHLORIDE 0.9 % IV SOLN: 250.0000 mL | INTRAVENOUS | Status: DC | PRN

## 2011-06-27 MED ORDER — ASPIRIN 81 MG PO CHEW
324.0000 mg | CHEWABLE_TABLET | ORAL | Status: DC
  Filled 2011-06-27: qty 3

## 2011-06-27 MED ORDER — HEPARIN SODIUM (PORCINE) 1000 UNIT/ML IJ SOLN
INTRAMUSCULAR | Status: AC
  Filled 2011-06-27: qty 1

## 2011-06-27 MED ORDER — SODIUM CHLORIDE 0.9 % IJ SOLN: 3.0000 mL | INTRAMUSCULAR | Status: DC | PRN

## 2011-06-27 MED ORDER — VERAPAMIL HCL 2.5 MG/ML IV SOLN
INTRAVENOUS | Status: AC
  Filled 2011-06-27: qty 2

## 2011-06-27 MED ORDER — ALPRAZOLAM 0.5 MG PO TABS
0.5000 mg | ORAL_TABLET | Freq: Three times a day (TID) | ORAL | Status: DC | PRN
  Filled 2011-06-27: qty 1

## 2011-06-27 MED ORDER — SODIUM CHLORIDE 0.9 % IV SOLN: 1.0000 mL/kg/h | INTRAVENOUS | Status: DC

## 2011-06-27 MED ORDER — DIAZEPAM 5 MG PO TABS: 5.0000 mg | ORAL_TABLET | ORAL | Status: DC

## 2011-06-27 MED ORDER — DARIFENACIN HYDROBROMIDE ER 15 MG PO TB24: 15.0000 mg | ORAL_TABLET | Freq: Every day | ORAL | Status: DC

## 2011-06-27 MED ORDER — CALCIUM CARBONATE-VITAMIN D 600-400 MG-UNIT PO TABS: 1.0000 | ORAL_TABLET | Freq: Every day | ORAL | Status: DC

## 2011-06-27 MED ORDER — NITROGLYCERIN 0.2 MG/ML ON CALL CATH LAB
INTRAVENOUS | Status: AC
  Filled 2011-06-27: qty 1

## 2011-06-27 MED ORDER — DOCUSATE SODIUM 100 MG PO CAPS: 100.0000 mg | ORAL_CAPSULE | Freq: Every day | ORAL | Status: DC | PRN

## 2011-06-27 MED ORDER — HEPARIN BOLUS VIA INFUSION
4000.0000 [IU] | Freq: Once | INTRAVENOUS | Status: AC
  Administered 2011-06-27: 4000 [IU] via INTRAVENOUS
  Filled 2011-06-27: qty 4000

## 2011-06-27 MED ORDER — HEPARIN SOD (PORCINE) IN D5W 100 UNIT/ML IV SOLN
1000.0000 [IU]/h | INTRAVENOUS | Status: DC
  Administered 2011-06-27: 1000 [IU]/h via INTRAVENOUS
  Filled 2011-06-27 (×2): qty 250

## 2011-06-27 MED ORDER — POLYETHYLENE GLYCOL 3350 17 G PO PACK
17.0000 g | PACK | Freq: Every day | ORAL | Status: DC | PRN
  Filled 2011-06-27: qty 1

## 2011-06-27 MED ORDER — OMEGA-3-ACID ETHYL ESTERS 1 G PO CAPS
2.0000 g | ORAL_CAPSULE | Freq: Two times a day (BID) | ORAL | Status: DC
  Administered 2011-06-27 – 2011-06-28 (×2): 2 g via ORAL
  Filled 2011-06-27 (×5): qty 2

## 2011-06-27 MED ORDER — NITROGLYCERIN 0.4 MG SL SUBL: 0.4000 mg | SUBLINGUAL_TABLET | SUBLINGUAL | Status: DC | PRN

## 2011-06-27 MED ORDER — ASPIRIN 81 MG PO CHEW: 81.0000 mg | CHEWABLE_TABLET | Freq: Every day | ORAL | Status: DC

## 2011-06-27 MED ORDER — FENTANYL CITRATE 0.05 MG/ML IJ SOLN
INTRAMUSCULAR | Status: AC
  Filled 2011-06-27: qty 2

## 2011-06-27 MED ORDER — ATORVASTATIN CALCIUM 40 MG PO TABS
40.0000 mg | ORAL_TABLET | Freq: Every day | ORAL | Status: DC
  Administered 2011-06-27: 40 mg via ORAL
  Filled 2011-06-27 (×2): qty 1

## 2011-06-27 MED ORDER — LIDOCAINE HCL (PF) 1 % IJ SOLN
INTRAMUSCULAR | Status: AC
  Filled 2011-06-27: qty 30

## 2011-06-27 MED ORDER — CALCIUM CARBONATE-VITAMIN D 500-200 MG-UNIT PO TABS
1.0000 | ORAL_TABLET | Freq: Every day | ORAL | Status: DC
  Administered 2011-06-27 – 2011-06-28 (×2): 1 via ORAL
  Filled 2011-06-27 (×2): qty 1

## 2011-06-27 MED ORDER — NITROGLYCERIN IN D5W 200-5 MCG/ML-% IV SOLN
3.0000 ug/min | INTRAVENOUS | Status: DC
  Filled 2011-06-27: qty 250

## 2011-06-27 MED ORDER — FLUTICASONE PROPIONATE 50 MCG/ACT NA SUSP: 2.0000 | NASAL | Status: DC | PRN

## 2011-06-27 MED ORDER — ONDANSETRON HCL 4 MG/2ML IJ SOLN: 4.0000 mg | Freq: Four times a day (QID) | INTRAMUSCULAR | Status: DC | PRN

## 2011-06-27 MED ORDER — ASPIRIN 81 MG PO CHEW
243.0000 mg | CHEWABLE_TABLET | Freq: Once | ORAL | Status: AC
  Administered 2011-06-27: 243 mg via ORAL

## 2011-06-27 NOTE — Progress Notes (Signed)
Stopped NS infusion after 3 hours post-op per MD order.

## 2011-06-27 NOTE — Progress Notes (Signed)
ANTICOAGULATION CONSULT NOTE - Initial Consult  Pharmacy Consult for Heparin    Indication: chest pain/ACS  Allergies  Allergen Reactions  . Latex     itching  . Lisinopril Cough  . Tramadol Hcl     REACTION: nausea, vomiting    Patient Measurements: Height: 5\' 6"  (167.6 cm) Weight: 185 lb 1.6 oz (83.961 kg) IBW/kg (Calculated) : 59.3   Vital Signs: Temp: 97.6 F (36.4 C) (02/27 0041) Temp src: Oral (02/27 0041) BP: 104/67 mmHg (02/27 0922) Pulse Rate: 66  (02/27 0922)  Labs:  Basename 06/27/11 0830 06/27/11 0615 06/27/11 0106 06/27/11 0100 06/26/11 1837  HGB 10.2* -- -- 10.7* --  HCT 30.8* -- -- 31.7* 36.1  PLT 205 -- -- 197 250  APTT -- -- -- 30 --  LABPROT -- -- -- 15.3* --  INR -- -- -- 1.18 --  HEPARINUNFRC 0.42 -- -- -- --  CREATININE -- 0.82 -- 0.71 0.66  CKTOTAL 54 -- 57 -- --  CKMB 2.1 -- 2.2 -- --  TROPONINI <0.30 -- <0.30 -- --   Estimated Creatinine Clearance: 83.7 ml/min (by C-G formula based on Cr of 0.82).    Assessment: 56 yo female with chest pain.  Heparin level = 0.42 after 4000 unit bolus and 1000 units/hr. HL therapeutic.  No bleeding noted.  Pt on schedule for cath lab at 1300.  Goal of Therapy:  Heparin level 0.3-0.7 units/ml   Plan:  Continue heparin at 1000 units/hr and f/u after cath for plans. Len Childs T 06/27/2011,10:52 AM

## 2011-06-27 NOTE — Progress Notes (Signed)
Discontinued heparin drip per MD order for systolic BP <100.

## 2011-06-27 NOTE — Interval H&P Note (Signed)
History and Physical Interval Note:  06/27/2011 1:18 PM  Sarah Ballard  has presented today for cardiac cath with the diagnosis of chest pain  The various methods of treatment have been discussed with the patient and family. After consideration of risks, benefits and other options for treatment, the patient has consented to  Procedure(s) (LRB): LEFT HEART CATHETERIZATION WITH CORONARY ANGIOGRAM (Bilateral) as a surgical intervention .  The patients' history has been reviewed, patient examined, no change in status, stable for surgery.  I have reviewed the patients' chart and labs.  Questions were answered to the patient's satisfaction.     Coco Sharpnack

## 2011-06-27 NOTE — Progress Notes (Signed)
Pt's right wrist with dressing is clean, dry and intact. No bleeding noted. Will continue to monitor.

## 2011-06-27 NOTE — Progress Notes (Addendum)
Pt's BP 90/58, d/c'd Nitro for now per protocol.  Will continue to monitor.  Arva Chafe

## 2011-06-27 NOTE — CV Procedure (Signed)
    Cardiac Catheterization Operative Report  Sarah Ballard [id number] 2/27/20131:59 PM Gwen Pounds, MD, MD  Procedure Performed:  1. Left Heart Catheterization 2. Selective Coronary Angiography 3. Left ventricular angiogram  Operator: Verne Carrow, MD  Arterial access site:  Right radial artery.   Indication: Pt with known CAD with Taxus DES proximal LAD in 2005, cutting balloon angioplasty LAD stent 2009 now admitted with chest pain, negative cardiac markers.                                       Procedure Details: The risks, benefits, complications, treatment options, and expected outcomes were discussed with the patient. The patient and/or family concurred with the proposed plan, giving informed consent. The patient was brought to the cath lab after IV hydration was begun and oral premedication was given. The patient was further sedated with Versed and Fentanyl. The right wrist was assessed with an Allens test which was positive. The right wrist was prepped and draped in a sterile fashion. 1% lidocaine was used for local anesthesia. Using the modified Seldinger access technique, a 5 French sheath was placed in the right radial artery. 3 mg Verapamil was given through the sheath. 4000 units IV heparin was given. Standard diagnostic catheters were used to perform selective coronary angiography. A pigtail catheter was used to perform a left ventricular angiogram. The sheath was removed from the right radial artery and a Terumo hemostasis band was applied at the arteriotomy site on the right wrist.    There were no immediate complications. The patient was taken to the recovery area in stable condition.   Hemodynamic Findings: Central aortic pressure: 118/68 Left ventricular pressure: 128/11/16  Angiographic Findings:  Left main:  Moderate calcification, distal 20% stenosis.   Left Anterior Descending Artery: Large caliber vessel that courses to the apex. There is  calcification in the proximal vessel. The ostium of the vessel has a 40% plaque. This does not appear to be changed since the last cath and does not appear to be flow limiting. The proximal vessel has a patent stent with minimal in-stent restenosis, ~20% restenosis in the distal segment of the stent. A small to moderate sized branch on the lateral wall is occluded and fills from collaterals. This is unchanged from previous cath.   Circumflex Artery: Moderate sized vessel with ostial 40% stenosis which is unchanged from previous cath. This does not appear to be flow limiting.   Right Coronary Artery: Large, dominant vessel with no obstructive disease. There are mild luminal irregularities distally.   Left Ventricular Angiogram: LVEF is 50-55%. Mild to moderate MR.    Impression: 1. Patent stent in the proximal LAD with moderate disease in the LAD and Circumflex. 2. Preserved LV systolic function.    Recommendations: Continue medical management.       Complications:  None. The patient tolerated the procedure well.

## 2011-06-27 NOTE — Progress Notes (Signed)
Heparin stopped after cath lab called and said they were coming to get patient.

## 2011-06-27 NOTE — Progress Notes (Signed)
ANTICOAGULATION CONSULT NOTE - Initial Consult  Pharmacy Consult for Heparin    Indication: chest pain/ACS  Allergies  Allergen Reactions  . Latex     itching  . Lisinopril Cough  . Tramadol Hcl     REACTION: nausea, vomiting    Patient Measurements: Height: 5\' 6"  (167.6 cm) Weight: 186 lb 8 oz (84.596 kg) IBW/kg (Calculated) : 59.3   Vital Signs: Temp: 97.6 F (36.4 C) (02/27 0041) Temp src: Oral (02/27 0041) BP: 107/64 mmHg (02/27 0041) Pulse Rate: 69  (02/27 0041)  Labs:  Alvira Philips 06/26/11 1837  HGB 12.3  HCT 36.1  PLT 250  APTT --  LABPROT --  INR --  HEPARINUNFRC --  CREATININE 0.66  CKTOTAL --  CKMB --  TROPONINI --   Estimated Creatinine Clearance: 86 ml/min (by C-G formula based on Cr of 0.66).  Medical History: Past Medical History  Diagnosis Date  . Hypothyroidism   . Hyperlipemia   . Hypertension   . Anxiety   . Degenerative disc disease   . CAD (coronary artery disease) 2005    s/p PCI LAD and cutting balloon angioplasty  with stent reexpansion 04/2008    Medications:  Prescriptions prior to admission  Medication Sig Dispense Refill  . ALPRAZolam (XANAX) 0.5 MG tablet Take 0.5 mg by mouth 2 (two) times daily. Take twice daily per patient      . aspirin 81 MG tablet Take 81 mg by mouth daily.        . Calcium Carbonate-Vitamin D (CALTRATE 600+D) 600-400 MG-UNIT per tablet Take 1 tablet by mouth daily.        . CRESTOR 20 MG tablet TAKE 1 TABLET BY MOUTH DAILY  90 tablet  PRN  . docusate sodium (COLACE) 100 MG capsule Take 100 mg by mouth as needed.        . fluticasone (FLONASE) 50 MCG/ACT nasal spray Place 2 sprays into the nose as needed. For allergy       . hydrocortisone cream 1 % Apply 1 application topically 2 (two) times daily. For Dermitis      . ibuprofen (ADVIL,MOTRIN) 200 MG tablet Take 200 mg by mouth every 6 (six) hours as needed. For pain      . irbesartan (AVAPRO) 150 MG tablet TAKE 1 TABLET BY MOUTH DAILY  90 tablet  2  .  levothyroxine (SYNTHROID, LEVOTHROID) 125 MCG tablet Take 125 mcg by mouth daily. 1 TABLET DAILY, 1/2 TABLET ON Sunday.      . metoprolol (LOPRESSOR) 25 MG tablet Take 25 mg by mouth 2 (two) times daily.        . nitroGLYCERIN (NITROSTAT) 0.4 MG SL tablet Place 0.4 mg under the tongue every 5 (five) minutes as needed. For chest pain      . omega-3 acid ethyl esters (LOVAZA) 1 G capsule Take 2 g by mouth 2 (two) times daily.        Marland Kitchen PLAVIX 75 MG tablet TAKE 1 TABLET BY MOUTH DAILY  90 tablet  3  . polyethylene glycol (MIRALAX / GLYCOLAX) packet Take 17 g by mouth daily as needed. For constipation      . solifenacin (VESICARE) 5 MG tablet Take 10 mg by mouth daily.        Assessment: 56 yo female with chest pain for Heparin  Goal of Therapy:  Heparin level 0.3-0.7 units/ml   Plan:  Heparin 4000 units IV bolus, then 1000 units/hr Check heparin level in 6 hours.  Richelle Glick, Gary Fleet 06/27/2011,1:24 AM

## 2011-06-27 NOTE — Progress Notes (Signed)
Subjective:   This is a 56yo WF with a history of CAD s/p PCI 2005 and then PTCA in 2009, HTN, dyslipidemia who was in her USOH until today around 3:30am when she was carrying groceries into the house. When she got home the chest heaviness got worse. She laid down and took a SL NTG x2 at 4:15 without any improvement. She was SOB but denied any diaphoresis or nausea. She took TUMS without relief. She took some xanax without any improvement. When she arrived in the ER she was given baby ASA x3 and morphine which improved her pain now to a 2/10. The pain is midsternal and she has also had bilateral jaw pain which is still there to a very mild degree. Her left arm was also aching with the pain which she has never had with her angina in the past but has improved with morphine.        . sodium chloride   Intravenous Once  . ALPRAZolam  0.5 mg Oral BID  . aspirin  324 mg Oral Once  . aspirin  324 mg Oral Pre-Cath  . aspirin EC  81 mg Oral Daily  . atorvastatin  40 mg Oral q1800  . calcium-vitamin D  1 tablet Oral Daily  . clopidogrel  75 mg Oral Once  . darifenacin  7.5 mg Oral Daily  . diazepam  5 mg Oral On Call  . heparin  4,000 Units Intravenous Once  . irbesartan  150 mg Oral Daily  . levothyroxine  125 mcg Oral Daily  . metoprolol tartrate  25 mg Oral BID  .  morphine injection  6 mg Intravenous Once  . nitroGLYCERIN  1 inch Topical Once  . omega-3 acid ethyl esters  2 g Oral BID  . ondansetron  4 mg Intravenous Once  . sodium chloride  3 mL Intravenous Q12H  . DISCONTD: aspirin  81 mg Oral Daily  . DISCONTD: aspirin  325 mg Oral STAT  . DISCONTD: Calcium Carbonate-Vitamin D  1 tablet Oral Daily  . DISCONTD: darifenacin  15 mg Oral Daily      . sodium chloride 75 mL/hr at 06/27/11 0511  . heparin 1,000 Units/hr (06/27/11 0218)  . nitroGLYCERIN      Objective:  Vital Signs in the last 24 hours: Blood pressure 90/58, pulse 67, temperature 97.6 F (36.4 C), temperature  source Oral, resp. rate 18, height 5\' 6"  (1.676 m), weight 186 lb 8 oz (84.596 kg), SpO2 96.00%. Temp:  [97.3 F (36.3 C)-97.6 F (36.4 C)] 97.6 F (36.4 C) (02/27 0041) Pulse Rate:  [67-86] 67  (02/27 0505) Resp:  [18-20] 18  (02/27 0041) BP: (90-129)/(58-78) 90/58 mmHg (02/27 0505) SpO2:  [96 %-100 %] 96 % (02/27 0041) Weight:  [180 lb (81.647 kg)-186 lb 8 oz (84.596 kg)] 186 lb 8 oz (84.596 kg) (02/27 0041)  Intake/Output from previous day: 02/26 0701 - 02/27 0700 In: 374.8 [I.V.:374.8] Out: -  Intake/Output from this shift:    Physical Exam:  Physical Exam: Blood pressure 90/58, pulse 67, temperature 97.6 F (36.4 C), temperature source Oral, resp. rate 18, height 5\' 6"  (1.676 m), weight 186 lb 8 oz (84.596 kg), SpO2 96.00%. General: Well developed, well nourished, in no acute distress. Head: Normocephalic, atraumatic, sclera non-icteric, mucus membranes are moist,  Neck: Supple. Normal carotids. No JVD Lungs: Clear bilaterally to auscultation without wheezes, rales, or rhonchi. Breathing is unlabored. Heart: Regular rate,  With normal  S1 S2. No murmurs, rubs,  or gallops  Abdomen: Soft, non-tender, non-distended with normoactive bowel sounds. No hepatomegaly. No rebound/guarding. No abdominal masses. Msk:  Strength and tone appear normal for age. Extremities: No clubbing or cyanosis. No edema.  Distal pedal pulses are 2+ and equal bilaterally. Neuro: Alert and oriented X 3. Moves all extremities spontaneously. Psych:  Responds to questions appropriately with a normal affect.    Lab Results:   [facility] 06/27/11 0100 06/26/11 1837  NA 137 132*  K 3.5 3.7  CL 105 100  CO2 24 20  GLUCOSE 147* 120*  BUN 17 17  CREATININE 0.71 0.66  CALCIUM 8.4 9.4  MG -- --  PHOS -- --    Basename 06/27/11 0100  AST 17  ALT 22  ALKPHOS 71  BILITOT 0.4  PROT 5.7*  ALBUMIN 3.1*     Basename 06/27/11 0100 06/26/11 1837  WBC 5.7 8.7  NEUTROABS 2.1 --  HGB 10.7* 12.3  HCT  31.7* 36.1  MCV 80.9 80.6  PLT 197 250    Basename 06/27/11 0106  CKTOTAL 57  CKMB 2.2  TROPONINI <0.30    Tele:  NSR  Assessment/Plan:   1.  CAD:  Pt presents with recurrent chest pain.  Will plan on cath today. Discussed risks/ benefits / options.  She understands and agrees to proceed.    Disposition:   Vesta Mixer, Montez Hageman., MD, [facility] 06/27/2011, 7:35 AM LOS: Day 1

## 2011-06-27 NOTE — ED Notes (Signed)
Complaining of headache and rates chest pain as 1/10.

## 2011-06-28 ENCOUNTER — Other Ambulatory Visit: Payer: Self-pay

## 2011-06-28 DIAGNOSIS — I2510 Atherosclerotic heart disease of native coronary artery without angina pectoris: Secondary | ICD-10-CM

## 2011-06-28 DIAGNOSIS — R079 Chest pain, unspecified: Secondary | ICD-10-CM

## 2011-06-28 LAB — CBC
Hemoglobin: 11 g/dL — ABNORMAL LOW (ref 12.0–15.0)
RBC: 3.95 MIL/uL (ref 3.87–5.11)
WBC: 5.2 10*3/uL (ref 4.0–10.5)

## 2011-06-28 NOTE — Progress Notes (Signed)
Pt discharged. Given instructions and pt verbalized understanding. Removed dressing on Right wrist. Incision site level 0. Removed IV, no bleeding, catheter intact.

## 2011-06-28 NOTE — Progress Notes (Signed)
Subjective:   This is a 56yo WF with a history of CAD s/p PCI 2005 and then PTCA in 2009, HTN, dyslipidemia who was in her USOH until today around 3:30am when she was carrying groceries into the house. When she got home the chest heaviness got worse. She laid down and took a SL NTG x2 at 4:15 without any improvement. She was SOB but denied any diaphoresis or nausea. She took TUMS without relief. She took some xanax without any improvement. When she arrived in the ER she was given baby ASA x3 and morphine which improved her pain now to a 2/10. The pain is midsternal and she has also had bilateral jaw pain which is still there to a very mild degree. Her left arm was also aching with the pain which she has never had with her angina in the past but has improved with morphine.  Cath yesterday reveal no changes from cath in 2009.  The LAD stent is patent.  She has an old chronically occluded OM that fills by collaterals and is exactly the same as previous cath.      . ALPRAZolam  0.5 mg Oral BID  . aspirin  243 mg Oral Once  . aspirin EC  81 mg Oral Daily  . atorvastatin  40 mg Oral q1800  . calcium-vitamin D  1 tablet Oral Daily  . clopidogrel  75 mg Oral Once  . darifenacin  7.5 mg Oral Daily  . diazepam  5 mg Oral On Call  . fentaNYL      . heparin      . heparin      . irbesartan  150 mg Oral Daily  . levothyroxine  125 mcg Oral Daily  . lidocaine      . metoprolol tartrate  25 mg Oral BID  . midazolam      . nitroGLYCERIN      . omega-3 acid ethyl esters  2 g Oral BID  . verapamil      . DISCONTD: aspirin  324 mg Oral Pre-Cath  . DISCONTD: aspirin  324 mg Oral Pre-Cath  . DISCONTD: aspirin  324 mg Oral Pre-Cath  . DISCONTD: diazepam  5 mg Oral On Call  . DISCONTD: sodium chloride  3 mL Intravenous Q12H      . sodium chloride    . DISCONTD: sodium chloride 75 mL/hr at 06/27/11 0511  . DISCONTD: sodium chloride    . DISCONTD: heparin 1,000 Units/hr (06/27/11 0218)  .  DISCONTD: nitroGLYCERIN    . DISCONTD: nitroGLYCERIN Stopped (06/27/11 1151)    Objective:  Vital Signs in the last 24 hours: Blood pressure 121/76, pulse 65, temperature 97.8 F (36.6 C), temperature source Oral, resp. rate 18, height 5\' 6"  (1.676 m), weight 185 lb 1.6 oz (83.961 kg), SpO2 100.00%. Temp:  [97.8 F (36.6 C)-98.2 F (36.8 C)] 97.8 F (36.6 C) (02/28 0622) Pulse Rate:  [65-78] 65  (02/28 0622) Resp:  [18-20] 18  (02/28 0622) BP: (99-126)/(65-80) 121/76 mmHg (02/28 0622) SpO2:  [95 %-100 %] 100 % (02/28 0622)  Intake/Output from previous day: 02/27 0701 - 02/28 0700 In: 590 [P.O.:590] Out: 800 [Urine:800] Intake/Output from this shift: Total I/O In: -  Out: 700 [Urine:700]  Physical Exam:  Physical Exam: Blood pressure 121/76, pulse 65, temperature 97.8 F (36.6 C), temperature source Oral, resp. rate 18, height 5\' 6"  (1.676 m), weight 185 lb 1.6 oz (83.961 kg), SpO2 100.00%. General: Well developed, well nourished, in no  acute distress. Head: Normocephalic, atraumatic, sclera non-icteric, mucus membranes are moist,  Neck: Supple. Normal carotids. No JVD Lungs: Clear bilaterally to auscultation without wheezes, rales, or rhonchi. Breathing is unlabored. Heart: Regular rate,  With normal  S1 S2. No murmurs, rubs, or gallops  Abdomen: Soft, non-tender, non-distended with normoactive bowel sounds. No hepatomegaly. No rebound/guarding. No abdominal masses. Msk:  Right radial cath site is normal Extremities: No clubbing or cyanosis. No edema.  Distal pedal pulses are 2+ and equal bilaterally. Neuro: Alert and oriented X 3. Moves all extremities spontaneously. Psych:  Responds to questions appropriately with a normal affect.    Lab Results:   Basename 06/27/11 0615 06/27/11 0100  NA 141 137  K 4.4 3.5  CL 108 105  CO2 28 24  GLUCOSE 123* 147*  BUN 15 17  CREATININE 0.82 0.71  CALCIUM 8.4 8.4  MG -- --  PHOS -- --    Basename 06/27/11 0100  AST 17    ALT 22  ALKPHOS 71  BILITOT 0.4  PROT 5.7*  ALBUMIN 3.1*     Basename 06/28/11 0600 06/27/11 0830 06/27/11 0100  WBC 5.2 5.8 --  NEUTROABS -- -- 2.1  HGB 11.0* 10.2* --  HCT 32.7* 30.8* --  MCV 82.8 82.1 --  PLT 187 205 --    Basename 06/27/11 1255 06/27/11 0830 06/27/11 0106  CKTOTAL 54 54 57  CKMB 2.0 2.1 2.2  TROPONINI <0.30 <0.30 <0.30    Tele:  NSR  Assessment/Plan:   1.  CAD:  Cath is OK.  Will DC to home today and will see me at her next scheduled apt.  She has requested that we we fill out an FMLA form  She will need to be excused from work starting Tuesday 2/26 and may return on Monday March 4. 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 Disposition:   Alvia Grove., MD, [facility] 06/28/2011, 8:38 AM LOS: Day 2

## 2011-06-28 NOTE — Discharge Instructions (Signed)
NO HEAVY LIFTING OR SEXUAL ACTIVITY X 7 DAYS. NO DRIVING X 2 DAYS. NO SOAKING BATHS, HOT TUBS, POOLS, ETC., X 5 DAYS.  

## 2011-06-28 NOTE — Discharge Summary (Signed)
CARDIOLOGY DISCHARGE SUMMARY   Patient ID: Sarah Ballard MRN: [id number] DOB/AGE: November 25, 1955 56 y.o.  Admit date: 06/26/2011 Discharge date: 06/28/2011  Primary Discharge Diagnosis:  Chest pain Secondary Discharge Diagnosis:  Patient Active Problem List  Diagnoses  . THYROID DISORDER  . HYPERLIPIDEMIA  . ANXIETY  . DEPRESSION  . HYPERTENSION  . CORONARY ARTERY DISEASE  . HEMORRHOIDS  . ARTHRITIS  . HEADACHE, CHRONIC  . Degenerative disc disease  . Chest pain   Procedures:  1. Left Heart Catheterization 2. Selective Coronary Angiography 3. Left ventricular angiogram  Hospital Course: Sarah Ballard is a 56 year old female with a history of coronary artery disease. She had chest pain and came to the hospital where she was admitted for further evaluation and treatment.  Her cardiac enzymes were negative for MI. She was taken to the cath lab on 06/27/2011 with the results listed below.  Left main: Moderate calcification, distal 20% stenosis.  Left Anterior Descending Artery: Large caliber vessel that courses to the apex. There is calcification in the proximal vessel. The ostium of the vessel has a 40% plaque. This does not appear to be changed since the last cath and does not appear to be flow limiting. The proximal vessel has a patent stent with minimal in-stent restenosis, ~20% restenosis in the distal segment of the stent. A small to moderate sized branch on the lateral wall is occluded and fills from collaterals. This is unchanged from previous cath.  Circumflex Artery: Moderate sized vessel with ostial 40% stenosis which is unchanged from previous cath. This does not appear to be flow limiting.  Right Coronary Artery: Large, dominant vessel with no obstructive disease. There are mild luminal irregularities distally.  Left Ventricular Angiogram: LVEF is 50-55%. Mild to moderate MR.  Impression:  1. Patent stent in the proximal LAD with moderate disease in the LAD and Circumflex.    2. Preserved LV systolic function.  Recommendations: Continue medical management.  On 06/28/2011, Sarah Ballard was seen by Dr. Elease Hashimoto. She was ambulating without chest pain or shortness of breath and is considered stable for discharge, to followup as an outpatient.   Labs:   Lab Results  Component Value Date   WBC 5.2 06/28/2011   HGB 11.0* 06/28/2011   HCT 32.7* 06/28/2011   MCV 82.8 06/28/2011   PLT 187 06/28/2011    Lab 06/27/11 0615 06/27/11 0100  NA 141 --  K 4.4 --  CL 108 --  CO2 28 --  BUN 15 --  CREATININE 0.82 --  CALCIUM 8.4 --  PROT -- 5.7*  BILITOT -- 0.4  ALKPHOS -- 71  ALT -- 22  AST -- 17  GLUCOSE 123* --    Basename 06/27/11 1255 06/27/11 0830 06/27/11 0106  CKTOTAL 54 54 57  CKMB 2.0 2.1 2.2  CKMBINDEX -- -- --  TROPONINI <0.30 <0.30 <0.30   Lipid Panel     Component Value Date/Time   CHOL 132 01/26/2011 0912   TRIG 41.0 01/26/2011 0912   HDL 52.60 01/26/2011 0912   CHOLHDL 3 01/26/2011 0912   VLDL 8.2 01/26/2011 0912   LDLCALC 71 01/26/2011 0912    Basename 06/27/11 0100  INR 1.18       Radiology: Dg Chest 2 View 06/26/2011  *RADIOLOGY REPORT*  Clinical Data: Chest pain.  Coronary artery disease.  Hypertension.  CHEST - 2 VIEW  Comparison:  08/28/2010  Findings:  The heart size and mediastinal contours are within normal limits.  Both lungs are clear.  The visualized skeletal structures are unremarkable.  IMPRESSION: No active cardiopulmonary disease.  Original Report Authenticated By: Danae Orleans, M.D.    EKG:28-Jun-2011 06:24:40  Normal sinus rhythm Normal ECG Vent. rate 65 BPM PR interval 120 ms QRS duration 72 ms QT/QTc 412/428 ms P-R-T axes 13 20 26   FOLLOW UP PLANS AND APPOINTMENTS Discharge Orders    Future Appointments: Provider: Department: Dept Phone: Center:   07/24/2011 8:30 AM Elyn Aquas., MD [facility] [phone] None     Allergies  Allergen Reactions  . Latex     itching  . Lisinopril Cough  . Tramadol  Hcl     REACTION: nausea, vomiting   Medication List  As of 06/28/2011  9:57 AM   TAKE these medications         ALPRAZolam 0.5 MG tablet   Commonly known as: XANAX   Take 0.5 mg by mouth 2 (two) times daily. Take twice daily per patient      aspirin 81 MG tablet   Take 81 mg by mouth daily.      CALTRATE 600+D 600-400 MG-UNIT per tablet   Generic drug: Calcium Carbonate-Vitamin D   Take 1 tablet by mouth daily.      CRESTOR 20 MG tablet   Generic drug: rosuvastatin   TAKE 1 TABLET BY MOUTH DAILY      docusate sodium 100 MG capsule   Commonly known as: COLACE   Take 100 mg by mouth as needed.      fluticasone 50 MCG/ACT nasal spray   Commonly known as: FLONASE   Place 2 sprays into the nose as needed. For allergy        hydrocortisone cream 1 %   Apply 1 application topically 2 (two) times daily. For Dermitis      ibuprofen 200 MG tablet   Commonly known as: ADVIL,MOTRIN   Take 200 mg by mouth every 6 (six) hours as needed. For pain      irbesartan 150 MG tablet   Commonly known as: AVAPRO   TAKE 1 TABLET BY MOUTH DAILY      levothyroxine 125 MCG tablet   Commonly known as: SYNTHROID, LEVOTHROID   Take 125 mcg by mouth daily. 1 TABLET DAILY, 1/2 TABLET ON Sunday.      metoprolol tartrate 25 MG tablet   Commonly known as: LOPRESSOR   Take 25 mg by mouth 2 (two) times daily.      nitroGLYCERIN 0.4 MG SL tablet   Commonly known as: NITROSTAT   Place 0.4 mg under the tongue every 5 (five) minutes as needed. For chest pain      omega-3 acid ethyl esters 1 G capsule   Commonly known as: LOVAZA   Take 2 g by mouth 2 (two) times daily.      PLAVIX 75 MG tablet   Generic drug: clopidogrel   TAKE 1 TABLET BY MOUTH DAILY      polyethylene glycol packet   Commonly known as: MIRALAX / GLYCOLAX   Take 17 g by mouth daily as needed. For constipation      solifenacin 5 MG tablet   Commonly known as: VESICARE   Take 10 mg by mouth daily.           Follow-up  Information    Follow up with Gwen Pounds, MD in 2 weeks. (As needed)       Follow up with Elyn Aquas., MD. (March 26th at 8:30 am)  Contact information:   [address], [address] [place] [address] [phone]          BRING ALL MEDICATIONS WITH YOU TO FOLLOW UP APPOINTMENTS  Time spent with patient to include physician time: 35 min Signed: Theodore Demark 06/28/2011, 9:57 AM Co-Sign MD  Attending Note:   The patient was seen and examined.  Agree with assessment and plan as noted above.  See my note from earlier today.  Vesta Mixer, Montez Hageman., MD, [facility] 06/28/2011, 4:54 PM

## 2011-07-03 ENCOUNTER — Telehealth: Payer: Self-pay | Admitting: *Deleted

## 2011-07-03 NOTE — Telephone Encounter (Signed)
Received FMLA papers today/ gave to kim.

## 2011-07-23 ENCOUNTER — Telehealth: Payer: Self-pay | Admitting: Cardiovascular Disease

## 2011-07-23 NOTE — Telephone Encounter (Signed)
Pt Called this am asking for FMLA to Mailed to Her Home address FMLA was Placed In Mail Today  07/23/11/Km

## 2011-07-24 ENCOUNTER — Encounter: Payer: Self-pay | Admitting: Cardiovascular Disease

## 2011-07-24 ENCOUNTER — Ambulatory Visit (INDEPENDENT_AMBULATORY_CARE_PROVIDER_SITE_OTHER): Payer: 59 | Admitting: Cardiovascular Disease

## 2011-07-24 VITALS — BP 123/83 | HR 78 | Ht 66.0 in | Wt 183.8 lb

## 2011-07-24 DIAGNOSIS — I2510 Atherosclerotic heart disease of native coronary artery without angina pectoris: Secondary | ICD-10-CM

## 2011-07-24 DIAGNOSIS — I10 Essential (primary) hypertension: Secondary | ICD-10-CM

## 2011-07-24 DIAGNOSIS — E785 Hyperlipidemia, unspecified: Secondary | ICD-10-CM

## 2011-07-24 LAB — HEPATIC FUNCTION PANEL
ALT: 27 U/L (ref 0–35)
Total Bilirubin: 0.3 mg/dL (ref 0.3–1.2)

## 2011-07-24 LAB — LIPID PANEL
HDL: 56.5 mg/dL (ref >39.00)
LDL Cholesterol: 69 mg/dL (ref 0–99)
Total CHOL/HDL Ratio: 2
VLDL: 5.6 mg/dL (ref 0.0–40.0)

## 2011-07-24 LAB — BASIC METABOLIC PANEL
CO2: 26 mEq/L (ref 19–32)
Chloride: 106 mEq/L (ref 96–112)
GFR: 90.17 mL/min (ref >60.00)
Glucose, Bld: 93 mg/dL (ref 70–99)
Potassium: 3.6 mEq/L (ref 3.5–5.1)
Sodium: 138 mEq/L (ref 135–145)

## 2011-07-24 NOTE — Progress Notes (Signed)
Sarah Ballard Date of Birth  1956/02/16 [facility] [address]    [address] [place], [place]  [address] [phone]  Fax  [phone]  Problem List 1. Coronary artery disease- recent heart catheterization revealed no significant coronary irregular irregularities 2. anxiety 3. Hyperlipidemia 4. Hypertension 5. Hypothyroidism 6. Chronic back pain  History of Present Illness:  The 56 yo  female with a history of coronary artery disease. She's status post PTCA and stenting of her left anterior descending artery. She also had Cutting Balloon procedure and reexpansion of that stent in January 2010.  She was admitted recently to the hospital for chest pain an dizziness and cath revealed no significant irregularities.  He is walking twice a week. She's not having episodes of angina with walking. 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  Current Outpatient Prescriptions on File Prior to Visit  Medication Sig Dispense Refill  . ALPRAZolam (XANAX) 0.5 MG tablet Take 0.5 mg by mouth 2 (two) times daily. Take twice daily per patient      . aspirin 81 MG tablet Take 81 mg by mouth daily.        . Calcium Carbonate-Vitamin D (CALTRATE 600+D) 600-400 MG-UNIT per tablet Take 1 tablet by mouth daily.        . CRESTOR 20 MG tablet TAKE 1 TABLET BY MOUTH DAILY  90 tablet  PRN  . docusate sodium (COLACE) 100 MG capsule Take 100 mg by mouth as needed.        Marland Kitchen ibuprofen (ADVIL,MOTRIN) 200 MG tablet Take 200 mg by mouth every 6 (six) hours as needed. For pain      . irbesartan (AVAPRO) 150 MG tablet TAKE 1 TABLET BY MOUTH DAILY  90 tablet  2  . levothyroxine (SYNTHROID, LEVOTHROID) 125 MCG tablet Take 125 mcg by mouth daily. 1 TABLET DAILY, 1/2 TABLET ON Sunday.      . metoprolol (LOPRESSOR) 25 MG tablet Take 25 mg by mouth 2 (two) times daily.        . nitroGLYCERIN (NITROSTAT) 0.4 MG SL tablet Place 0.4 mg under the tongue every 5 (five)  minutes as needed. For chest pain      . omega-3 acid ethyl esters (LOVAZA) 1 G capsule Take 2 g by mouth 2 (two) times daily.        Marland Kitchen PLAVIX 75 MG tablet TAKE 1 TABLET BY MOUTH DAILY  90 tablet  3  . polyethylene glycol (MIRALAX / GLYCOLAX) packet Take 17 g by mouth daily as needed. For constipation      . solifenacin (VESICARE) 5 MG tablet Take 10 mg by mouth daily.        Allergies  Allergen Reactions  . Latex     itching  . Lisinopril Cough  . Tramadol Hcl     REACTION: nausea, vomiting    Past Medical History  Diagnosis Date  . Hypothyroidism   . Hyperlipemia   . Hypertension   . Anxiety   . Degenerative disc disease   . CAD (coronary artery disease) 2005    s/p PCI LAD and cutting balloon angioplasty  with stent reexpansion 04/2008    Past Surgical History  Procedure Date  . Coronary angioplasty 2009  . Nerve root block   . Coronary angioplasty with stent placement 2005  . Tubal ligation     History  Smoking status  . Never Smoker   Smokeless tobacco  . Not on file  History  Alcohol Use  . Yes    occ wine    Family History  Problem Relation Age of Onset  . Colon cancer Mother   . Stroke Mother   . Hypertension Mother   . Heart attack Father     x2    Reviw of Systems:  Reviewed in the HPI.  All other systems are negative.  Physical Exam: BP 123/83  Pulse 78  Ht 5\' 6"  (1.676 m)  Wt 183 lb 12.8 oz (83.371 kg)  BMI 29.67 kg/m2 The patient is alert and oriented x 3.  The mood and affect are normal.   Skin: warm and dry.  Color is normal.    HEENT:   the sclera are nonicteric.  The mucous membranes are moist.  The carotids are 2+ without bruits.  There is no thyromegaly.  There is no JVD.    Lungs: clear.  The chest wall is non tender.    Heart: regular rate with a normal S1 and S2.  There are no murmurs, gallops, or rubs. The PMI is not displaced.     Abdomen: good bowel sounds.  There is no guarding or rebound.  There is no  hepatosplenomegaly or tenderness.  There are no masses.   Extremities:  no clubbing, cyanosis, or edema.  The legs are without rashes.    Her right radial cath site is well-healed. There is a small scar associated with her site. Her pulses are intact.  Neuro:  Cranial nerves II - XII are intact.  Motor and sensory functions are intact.    The gait is normal.   Assessment / Plan:

## 2011-07-24 NOTE — Assessment & Plan Note (Signed)
Sarah Ballard doing well. Her most recent cath revealed no significant irregularities. She will continue with her same medications. We'll check a fasting lipids today. I will see her again in 6 months check fasting labs at that time.

## 2011-07-24 NOTE — Assessment & Plan Note (Signed)
Her blood pressure is well controlled. 

## 2011-07-24 NOTE — Patient Instructions (Signed)
Your physician wants you to follow-up in: 6 months You will receive a reminder letter in the mail two months in advance. If you don't receive a letter, please call our office to schedule the follow-up appointment.  Your physician recommends that you return for a FASTING lipid profile: today and in 6 months  

## 2011-07-26 ENCOUNTER — Encounter: Payer: Self-pay | Admitting: *Deleted

## 2011-07-31 ENCOUNTER — Other Ambulatory Visit: Payer: Self-pay | Admitting: Cardiovascular Disease

## 2011-08-03 NOTE — Telephone Encounter (Signed)
New msg: Pt calling stating that she needs refill of xanax. Please call rx in ASAP.

## 2011-08-06 ENCOUNTER — Other Ambulatory Visit: Payer: Self-pay | Admitting: *Deleted

## 2011-08-06 MED ORDER — ALPRAZOLAM 0.5 MG PO TABS: 0.5000 mg | ORAL_TABLET | Freq: Two times a day (BID) | ORAL | Status: DC

## 2011-08-06 NOTE — Telephone Encounter (Signed)
Refill given pr dr Elease Hashimoto

## 2011-09-10 LAB — HM PAP SMEAR: HM Pap smear: NEGATIVE

## 2011-09-25 ENCOUNTER — Other Ambulatory Visit: Payer: Self-pay | Admitting: Cardiovascular Disease

## 2011-09-27 NOTE — Telephone Encounter (Signed)
Pt was given xanax 0.5 mg # 30 no refills, informed pt and pharmacy she needs to see pcp to have pcp refill. Pt agreed to plan.

## 2011-11-29 ENCOUNTER — Other Ambulatory Visit: Payer: Self-pay | Admitting: Cardiovascular Disease

## 2011-11-29 NOTE — Telephone Encounter (Signed)
Fax Received. Refill Completed. Sarah Ballard (R.M.A)   

## 2012-02-12 ENCOUNTER — Other Ambulatory Visit: Payer: Self-pay | Admitting: Cardiovascular Disease

## 2012-02-12 NOTE — Telephone Encounter (Signed)
Fax Received. Refill Completed. Salem Lembke Chowoe (R.M.A)   

## 2012-03-03 ENCOUNTER — Other Ambulatory Visit: Payer: Self-pay | Admitting: Cardiovascular Disease

## 2012-04-14 ENCOUNTER — Other Ambulatory Visit: Payer: Self-pay | Admitting: *Deleted

## 2012-04-14 MED ORDER — ROSUVASTATIN CALCIUM 20 MG PO TABS: 20.0000 mg | ORAL_TABLET | Freq: Every day | ORAL | Status: DC

## 2012-04-14 NOTE — Telephone Encounter (Signed)
Pt needs appointment then refill can be made Fax Received. Refill Completed. Rook Maue Chowoe (R.M.A)   

## 2012-04-25 ENCOUNTER — Encounter (HOSPITAL_COMMUNITY): Payer: Self-pay | Admitting: *Deleted

## 2012-04-25 ENCOUNTER — Emergency Department (HOSPITAL_COMMUNITY)
Admission: EM | Admit: 2012-04-25 | Discharge: 2012-04-25 | Disposition: A | Discharge status: Home / Self Care | Payer: 59 | Source: Home / Self Care

## 2012-04-25 DIAGNOSIS — J329 Chronic sinusitis, unspecified: Secondary | ICD-10-CM

## 2012-04-25 HISTORY — DX: Nonspecific reaction to tuberculin skin test without active tuberculosis: R76.11

## 2012-04-25 HISTORY — DX: Angina pectoris, unspecified: I20.9

## 2012-04-25 MED ORDER — AZITHROMYCIN 250 MG PO TABS: ORAL_TABLET | ORAL | Status: DC

## 2012-04-25 NOTE — ED Provider Notes (Signed)
Sarah Ballard is a 56 y.o. female who presents to Urgent Care today for sinus pressure and pain associated with congestion and green discharge. Symptoms have been present for 2 days.  Additionally patient notes productive cough with green sputum. Additionally she notes right ear pain. She denies any fevers or chills and feels well otherwise. She has tried some over-the-counter cold medications as well as nasal spray which has helped a bit. This is consistent with prior episodes of sinusitis with which Z-Paks have worked well in the past.  She feels well otherwise.   PMH reviewed. Hypertension, coronary artery disease, thyroid disease History  Substance Use Topics  . Smoking status: Never Smoker   . Smokeless tobacco: Not on file  . 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 No current facility-administered medications for this encounter.   Current Outpatient Prescriptions  Medication Sig Dispense Refill  . ALPRAZolam (XANAX) 0.5 MG tablet TAKE 1-3 TABLETS BY MOUTH DAILY AS DIRECTED  30 tablet  0  . aspirin 81 MG tablet Take 81 mg by mouth daily.        . Chlorpheniramine-DM (CORICIDIN HBP COUGH/COLD PO) Take by mouth.      . clopidogrel (PLAVIX) 75 MG tablet TAKE 1 TABLET BY MOUTH DAILY  90 tablet  3  . docusate sodium (COLACE) 100 MG capsule Take 100 mg by mouth as needed.        Marland Kitchen ibuprofen (ADVIL,MOTRIN) 200 MG tablet Take 600 mg by mouth every 6 (six) hours as needed. For pain      . irbesartan (AVAPRO) 150 MG tablet TAKE 1 TABLET BY MOUTH DAILY  90 tablet  2  . levothyroxine (SYNTHROID, LEVOTHROID) 125 MCG tablet Take 125 mcg by mouth daily. 1 TABLET DAILY, 1/2 TABLET ON Sunday.      . metoprolol (LOPRESSOR) 50 MG tablet TAKE 1/2 TABLET BY MOUTH 2 TIMES A DAY  180 tablet  3  . mirabegron ER (MYRBETRIQ) 25 MG TB24 Take 25 mg by mouth daily.      Marland Kitchen omega-3 acid ethyl esters (LOVAZA) 1 G capsule Take 2 g by  mouth 2 (two) times daily.        Marland Kitchen oxymetazoline (AFRIN) 0.05 % nasal spray Place 2 sprays into the nose 2 (two) times daily.      . rosuvastatin (CRESTOR) 20 MG tablet Take 1 tablet (20 mg total) by mouth daily.  90 tablet  0  . azithromycin (ZITHROMAX) 250 MG tablet 2 po day 1 1 po days 2-5  6 each  0  . Calcium Carbonate-Vitamin D (CALTRATE 600+D) 600-400 MG-UNIT per tablet Take 1 tablet by mouth daily.        . cyclobenzaprine (FLEXERIL) 10 MG tablet Take 10 mg by mouth as needed.      . Hydrocodone-Acetaminophen (VICODIN PO) Take by mouth as needed.      . nitroGLYCERIN (NITROSTAT) 0.4 MG SL tablet Place 0.4 mg under the tongue every 5 (five) minutes as needed. For chest pain      . polyethylene glycol (MIRALAX / GLYCOLAX) packet Take 17 g by mouth daily as needed. For constipation      . solifenacin (VESICARE) 5 MG tablet Take 10 mg by mouth daily.      . [DISCONTINUED] fluticasone (FLONASE) 50 MCG/ACT nasal spray Place 2 sprays into the nose as needed. For allergy  Exam:  BP 133/78  Pulse 84  Temp 99.8 F (37.7 C) (Oral)  Resp 22  SpO2 100% Gen: Well NAD HEENT: EOMI,  MMM, posterior pharynx with cobblestoning and erythema. Tympanic membranes bilaterally are retracted without effusion or erythema.   Patient has tenderness to percussion over the maxillary sinuses bilaterally right worse than left Lungs: CTABL Nl WOB Heart: RRR no MRG Abd: NABS, NT, ND Exts: Non edematous BL  LE, warm and well perfused.   No results found for this or any previous visit (from the past 24 hour(s)). No results found.  Assessment and Plan: 56 y.o. female with sinusitis.  Viral versus bacterial. It is reasonable to this point to treat with a course of azithromycin. Additionally recommend symptomatic treatment with Tylenol Sudafed or over-the-counter nasal spray.  Followup if not improved in several days. Discussed warning signs or symptoms. Please see discharge instructions. Patient expresses  understanding.      Rodolph Bong, MD 04/25/12 [phone]

## 2012-04-25 NOTE — ED Provider Notes (Signed)
Medical screening examination/treatment/procedure(s) were performed by a resident physician and as supervising physician I was immediately available for consultation/collaboration.  Leslee Home, M.D.   Reuben Likes, MD 04/25/12 [phone]

## 2012-04-25 NOTE — ED Notes (Addendum)
C/o sore throat, R ear pain, facial pressure and possible sinus infection onset 12/25.  States secretions went from white to yellow and green today.

## 2012-05-29 ENCOUNTER — Other Ambulatory Visit: Payer: Self-pay | Admitting: Internal Medicine

## 2012-05-29 DIAGNOSIS — E049 Nontoxic goiter, unspecified: Secondary | ICD-10-CM

## 2012-06-02 ENCOUNTER — Ambulatory Visit
Admission: RE | Admit: 2012-06-02 | Discharge: 2012-06-02 | Disposition: A | Discharge status: Home / Self Care | Payer: 59 | Source: Ambulatory Visit | Attending: Internal Medicine | Admitting: Internal Medicine

## 2012-06-02 DIAGNOSIS — E049 Nontoxic goiter, unspecified: Secondary | ICD-10-CM

## 2012-07-30 ENCOUNTER — Other Ambulatory Visit: Payer: Self-pay | Admitting: *Deleted

## 2012-07-30 MED ORDER — ROSUVASTATIN CALCIUM 20 MG PO TABS: 20.0000 mg | ORAL_TABLET | Freq: Every day | ORAL | Status: DC

## 2012-07-30 NOTE — Telephone Encounter (Signed)
Fax Received. Refill Completed. [name] (R.M.A)   

## 2012-07-31 ENCOUNTER — Emergency Department (HOSPITAL_COMMUNITY)
Admission: EM | Admit: 2012-07-31 | Discharge: 2012-07-31 | Disposition: A | Discharge status: Home / Self Care | Payer: 59 | Source: Home / Self Care | Attending: Family Medicine | Admitting: Family Medicine

## 2012-07-31 ENCOUNTER — Encounter (HOSPITAL_COMMUNITY): Payer: Self-pay

## 2012-07-31 DIAGNOSIS — J019 Acute sinusitis, unspecified: Secondary | ICD-10-CM

## 2012-07-31 MED ORDER — MINOCYCLINE HCL 100 MG PO CAPS: 100.0000 mg | ORAL_CAPSULE | Freq: Two times a day (BID) | ORAL | Status: DC

## 2012-07-31 MED ORDER — IPRATROPIUM BROMIDE 0.06 % NA SOLN: 2.0000 | Freq: Four times a day (QID) | NASAL | Status: DC

## 2012-07-31 NOTE — ED Provider Notes (Addendum)
History     CSN: [id number]  Arrival date & time 07/31/12  1503   First MD Initiated Contact with Patient 07/31/12 1531      Chief Complaint  Patient presents with  . Cough    (Consider location/radiation/quality/duration/timing/severity/associated sxs/prior treatment) Patient is a 57 y.o. female presenting with URI. The history is provided by the patient.  URI Presenting symptoms: congestion, cough, fever, rhinorrhea and sore throat   Severity:  Mild Duration:  10 days Progression:  Unchanged Chronicity:  New   Past Medical History  Diagnosis Date  . Hypothyroidism   . Hyperlipemia   . Hypertension   . Anxiety   . Degenerative disc disease   . CAD (coronary artery disease) 2005    s/p PCI LAD and cutting balloon angioplasty  with stent reexpansion 04/2008  . Angina pectoris   . Positive PPD     neg. CXR, treated with INH for 1 yr.  about 43    Past Surgical History  Procedure Laterality Date  . Coronary angioplasty  2009  . Nerve root block    . Coronary angioplasty with stent placement  2005  . Tubal ligation    . Tonsillectomy    . Laparoscopy      for endometriosis    Family History  Problem Relation Age of Onset  . Colon cancer Mother   . Stroke Mother   . Hypertension Mother   . Heart attack Father     x2    History  Substance Use Topics  . Smoking status: Never Smoker   . Smokeless tobacco: Not on file  . Alcohol Use: Yes     Comment: occ wine    OB History   Grav Para Term Preterm Abortions TAB SAB Ect Mult Living                  Review of Systems  Constitutional: Positive for fever.  HENT: Positive for congestion, sore throat, rhinorrhea and postnasal drip.   Respiratory: Positive for cough.   Cardiovascular: Negative.   Gastrointestinal: Negative.     Allergies  Latex; Lisinopril; and Tramadol hcl  Home Medications   Current Outpatient Rx  Name  Route  Sig  Dispense  Refill  . aspirin 81 MG tablet   Oral   Take 81 mg  by mouth daily.           . Calcium Carbonate-Vitamin D (CALTRATE 600+D) 600-400 MG-UNIT per tablet   Oral   Take 1 tablet by mouth daily.           . clopidogrel (PLAVIX) 75 MG tablet      TAKE 1 TABLET BY MOUTH DAILY   90 tablet   3   . irbesartan (AVAPRO) 150 MG tablet      TAKE 1 TABLET BY MOUTH DAILY   90 tablet   2   . levothyroxine (SYNTHROID, LEVOTHROID) 125 MCG tablet   Oral   Take 125 mcg by mouth daily. 1 TABLET DAILY, 1/2 TABLET ON Sunday.         . metoprolol (LOPRESSOR) 50 MG tablet      TAKE 1/2 TABLET BY MOUTH 2 TIMES A DAY   180 tablet   3   . mirabegron ER (MYRBETRIQ) 25 MG TB24   Oral   Take 25 mg by mouth daily.         Marland Kitchen omega-3 acid ethyl esters (LOVAZA) 1 G capsule   Oral   Take 2 g  by mouth 2 (two) times daily.           . rosuvastatin (CRESTOR) 20 MG tablet   Oral   Take 1 tablet (20 mg total) by mouth daily.   90 tablet   0     No refills until appointment   . ALPRAZolam (XANAX) 0.5 MG tablet      TAKE 1-3 TABLETS BY MOUTH DAILY AS DIRECTED   30 tablet   0     Pt to get from pcp   . azithromycin (ZITHROMAX) 250 MG tablet      2 po day 1 1 po days 2-5   6 each   0   . Chlorpheniramine-DM (CORICIDIN HBP COUGH/COLD PO)   Oral   Take by mouth.         . cyclobenzaprine (FLEXERIL) 10 MG tablet   Oral   Take 10 mg by mouth as needed.         . docusate sodium (COLACE) 100 MG capsule   Oral   Take 100 mg by mouth as needed.           . Hydrocodone-Acetaminophen (VICODIN PO)   Oral   Take by mouth as needed.         Marland Kitchen ibuprofen (ADVIL,MOTRIN) 200 MG tablet   Oral   Take 600 mg by mouth every 6 (six) hours as needed. For pain         . ipratropium (ATROVENT) 0.06 % nasal spray   Nasal   Place 2 sprays into the nose 4 (four) times daily.   15 mL   1   . minocycline (MINOCIN,DYNACIN) 100 MG capsule   Oral   Take 1 capsule (100 mg total) by mouth 2 (two) times daily.   20 capsule   0   .  nitroGLYCERIN (NITROSTAT) 0.4 MG SL tablet   Sublingual   Place 0.4 mg under the tongue every 5 (five) minutes as needed. For chest pain         . oxymetazoline (AFRIN) 0.05 % nasal spray   Nasal   Place 2 sprays into the nose 2 (two) times daily.         . polyethylene glycol (MIRALAX / GLYCOLAX) packet   Oral   Take 17 g by mouth daily as needed. For constipation         . solifenacin (VESICARE) 5 MG tablet   Oral   Take 10 mg by mouth daily.           BP 103/55  Pulse 76  Temp(Src) 98 F (36.7 C) (Oral)  Resp 20  SpO2 100%  Physical Exam  Nursing note and vitals reviewed. Constitutional: She is oriented to person, place, and time. She appears well-developed and well-nourished.  HENT:  Head: Normocephalic.  Right Ear: External ear normal.  Left Ear: External ear normal.  Mouth/Throat: Oropharynx is clear and moist.  Eyes: Conjunctivae are normal. Pupils are equal, round, and reactive to light.  Neck: Normal range of motion. Neck supple.  Cardiovascular: Regular rhythm and normal heart sounds.   Pulmonary/Chest: Effort normal and breath sounds normal.  Lymphadenopathy:    She has no cervical adenopathy.  Neurological: She is alert and oriented to person, place, and time.  Skin: Skin is warm and dry.  Psychiatric: She has a normal mood and affect.    ED Course  Procedures (including critical care time)  Labs Reviewed - No data to display No results found.  1. Sinusitis, acute       MDM          Linna Hoff, MD 07/31/12 1717  Linna Hoff, MD 07/31/12 [phone]

## 2012-07-31 NOTE — ED Notes (Signed)
Onset 3-23 of cough, st, ear pain; lost voice from cough; reported green nasal secretions

## 2012-09-17 ENCOUNTER — Encounter: Payer: Self-pay | Admitting: *Deleted

## 2012-09-18 ENCOUNTER — Ambulatory Visit (INDEPENDENT_AMBULATORY_CARE_PROVIDER_SITE_OTHER): Payer: 59 | Admitting: Nurse Practitioner

## 2012-09-18 ENCOUNTER — Encounter: Payer: Self-pay | Admitting: Nurse Practitioner

## 2012-09-18 VITALS — BP 134/70 | HR 78 | Resp 14 | Ht 66.0 in | Wt 175.6 lb

## 2012-09-18 DIAGNOSIS — Z0000 Encounter for general adult medical examination without abnormal findings: Secondary | ICD-10-CM

## 2012-09-18 DIAGNOSIS — Z01419 Encounter for gynecological examination (general) (routine) without abnormal findings: Secondary | ICD-10-CM

## 2012-09-18 LAB — POCT URINALYSIS DIPSTICK
Leukocytes, UA: NEGATIVE
Urobilinogen, UA: NEGATIVE

## 2012-09-18 MED ORDER — ESTRADIOL 0.1 MG/GM VA CREA: 2.0000 g | TOPICAL_CREAM | Freq: Every day | VAGINAL | Status: DC

## 2012-09-18 NOTE — Progress Notes (Signed)
57 y.o. G4P3 Married Caucasian Fe here for annual exam.  Ex husband died in 2022-06-27 from cirrhosis.  Now current husband has left last weekend and she has been really upset not knowing what is going on. Father also lives with her and needs care because of his health issues and diabetes.  Patient's last menstrual period was 06/29/2006.          Sexually active: yes  The current method of family planning is post menopausal status.    Exercising: no  The patient does not participate in regular exercise at present. Smoker:  no  Health Maintenance: Pap:  09/06/2011  Normal with negative HR HPV MMG:  01/2012 Colonoscopy:  04/2010 1 polyp recheck in 5 years BMD:   03/13/2010 normal TDaP:  11/08 Labs: PCP does lab work.    reports that she has never smoked. She does not have any smokeless tobacco history on file. She reports that she drinks about 0.6 ounces of alcohol per week. She reports that she does not use illicit drugs.  Past Medical History  Diagnosis Date  . Hypothyroidism   . Hyperlipemia   . Hypertension   . Anxiety   . Degenerative disc disease   . CAD (coronary artery disease) 2005    s/p PCI LAD and cutting balloon angioplasty  with stent reexpansion 04/2008  . Angina pectoris   . Positive PPD     neg. CXR, treated with INH for 1 yr.  about 63  . MI (myocardial infarction) 2005    angio with stent  . Hypercholesterolemia     Past Surgical History  Procedure Laterality Date  . Coronary angioplasty  2009  . Nerve root block    . Coronary angioplasty with stent placement  2005  . Tubal ligation    . Tonsillectomy    . Laparoscopy      for endometriosis  . Ruptured disc  2007    with epidural     Current Outpatient Prescriptions  Medication Sig Dispense Refill  . ALPRAZolam (XANAX) 0.5 MG tablet TAKE 1-3 TABLETS BY MOUTH DAILY AS DIRECTED  30 tablet  0  . aspirin 81 MG tablet Take 81 mg by mouth daily.        . Calcium Carbonate-Vitamin D (CALTRATE 600+D) 600-400  MG-UNIT per tablet Take 1 tablet by mouth daily.        . clopidogrel (PLAVIX) 75 MG tablet TAKE 1 TABLET BY MOUTH DAILY  90 tablet  3  . cyclobenzaprine (FLEXERIL) 10 MG tablet Take 10 mg by mouth as needed.      . docusate sodium (COLACE) 100 MG capsule Take 100 mg by mouth as needed.        . Hydrocodone-Acetaminophen (VICODIN PO) Take by mouth as needed.      Marland Kitchen ibuprofen (ADVIL,MOTRIN) 200 MG tablet Take 600 mg by mouth every 6 (six) hours as needed. For pain      . irbesartan (AVAPRO) 150 MG tablet TAKE 1 TABLET BY MOUTH DAILY  90 tablet  2  . levothyroxine (SYNTHROID, LEVOTHROID) 125 MCG tablet Take 125 mcg by mouth daily. 1 TABLET DAILY, 1/2 TABLET ON Sunday.      . metoprolol (LOPRESSOR) 50 MG tablet TAKE 1/2 TABLET BY MOUTH 2 TIMES A DAY  180 tablet  3  . minocycline (MINOCIN,DYNACIN) 100 MG capsule Take 1 capsule (100 mg total) by mouth 2 (two) times daily.  20 capsule  0  . mirabegron ER (MYRBETRIQ) 25 MG TB24 Take  25 mg by mouth daily.      . nitroGLYCERIN (NITROSTAT) 0.4 MG SL tablet Place 0.4 mg under the tongue every 5 (five) minutes as needed. For chest pain      . omega-3 acid ethyl esters (LOVAZA) 1 G capsule Take 2 g by mouth 2 (two) times daily.        Marland Kitchen oxymetazoline (AFRIN) 0.05 % nasal spray Place 2 sprays into the nose 2 (two) times daily.      . polyethylene glycol (MIRALAX / GLYCOLAX) packet Take 17 g by mouth daily as needed. For constipation      . rosuvastatin (CRESTOR) 20 MG tablet Take 1 tablet (20 mg total) by mouth daily.  90 tablet  0  . azithromycin (ZITHROMAX) 250 MG tablet 2 po day 1 1 po days 2-5  6 each  0  . Chlorpheniramine-DM (CORICIDIN HBP COUGH/COLD PO) Take by mouth.      Marland Kitchen ipratropium (ATROVENT) 0.06 % nasal spray Place 2 sprays into the nose 4 (four) times daily.  15 mL  1  . solifenacin (VESICARE) 5 MG tablet Take 10 mg by mouth daily.      . [DISCONTINUED] fluticasone (FLONASE) 50 MCG/ACT nasal spray Place 2 sprays into the nose as needed. For  allergy        No current facility-administered medications for this visit.    Family History  Problem Relation Age of Onset  . Colon cancer Mother   . Stroke Mother   . Hypertension Mother   . Heart attack Father     x2    ROS:  Pertinent items are noted in HPI.  Otherwise, a comprehensive ROS was negative.  Exam:   BP 134/70  Pulse 78  Resp 14  Ht 5\' 6"  (1.676 m)  Wt 175 lb 9.6 oz (79.652 kg)  BMI 28.36 kg/m2  LMP 06/29/2006 Height: 5\' 6"  (167.6 cm)  Ht Readings from Last 3 Encounters:  09/18/12 5\' 6"  (1.676 m)  07/24/11 5\' 6"  (1.676 m)  06/26/11 5\' 6"  (1.676 m)    General appearance: alert, cooperative and appears stated age Head: Normocephalic, without obvious abnormality, atraumatic Neck: no adenopathy, supple, symmetrical, trachea midline and thyroid normal to inspection and palpation Lungs: clear to auscultation bilaterally Breasts: normal appearance, no masses or tenderness Heart: regular rate and rhythm Abdomen: soft, non-tender; no masses,  no organomegaly Extremities: extremities normal, atraumatic, no cyanosis or edema Skin: Skin color, texture, turgor normal. No rashes or lesions Lymph nodes: Cervical, supraclavicular, and axillary nodes normal. No abnormal inguinal nodes palpated Neurologic: Grossly normal   Pelvic: External genitalia:  no lesions              Urethra:  normal appearing urethra with no masses, tenderness or lesions              Bartholin's and Skene's: normal                 Vagina: normal appearing vagina with normal color and discharge, no lesions              Cervix: anteverted              Pap taken: no Bimanual Exam:  Uterus:  normal size, contour, position, consistency, mobility, non-tender              Adnexa: no mass, fullness, tenderness               Rectovaginal: Confirms  Anus:  normal sphincter tone, no lesions  A:  Well Woman with normal exam  Postmenopausal  Atrophic vaginitis on Vaginal  estrogen  Marital issues  Hyperlipidemia and CAD   P:   Pap smear as per guidelines   Mammogram due 01/2013  Refill Estrace vaginal cream, patient is aware of potential side effects and risk of estrogen, such as   DVT, CA, cancer, etc. She wants to continue and states cardiologist is aware of her being on vaginal  estrogen and is OK with use. return annually or prn  An After Visit Summary was printed and given to the patient.

## 2012-09-18 NOTE — Patient Instructions (Addendum)

## 2012-09-19 NOTE — Progress Notes (Signed)
Encounter reviewed by Dr. Brook Silva.  

## 2012-10-22 ENCOUNTER — Encounter: Payer: Self-pay | Admitting: Cardiovascular Disease

## 2012-10-23 ENCOUNTER — Ambulatory Visit: Payer: 59 | Admitting: Cardiovascular Disease

## 2012-10-27 ENCOUNTER — Ambulatory Visit (INDEPENDENT_AMBULATORY_CARE_PROVIDER_SITE_OTHER): Payer: 59 | Admitting: Cardiovascular Disease

## 2012-10-27 ENCOUNTER — Encounter: Payer: Self-pay | Admitting: Cardiovascular Disease

## 2012-10-27 VITALS — BP 104/82 | HR 66 | Ht 66.0 in | Wt 178.8 lb

## 2012-10-27 DIAGNOSIS — E785 Hyperlipidemia, unspecified: Secondary | ICD-10-CM

## 2012-10-27 DIAGNOSIS — I2510 Atherosclerotic heart disease of native coronary artery without angina pectoris: Secondary | ICD-10-CM

## 2012-10-27 NOTE — Patient Instructions (Addendum)
Your physician recommends that you return for lab work in: today  Your physician wants you to follow-up in: 1 year with ekg  You will receive a reminder letter in the mail two months in advance. If you don't receive a letter, please call our office to schedule the follow-up appointment.

## 2012-10-27 NOTE — Assessment & Plan Note (Addendum)
Sarah Ballard is doing well.  Continue with her current meds.  Check  Labs today and again in 1 year.   She had a stent placed around 9 years ago. She may need to have an injection into her spine for her back pain. It should be okay for her to Plavix for 7 days prior to the back injection.

## 2012-10-27 NOTE — Progress Notes (Signed)
Sarah Ballard Date of Birth  06/11/1955 [facility] [address]    [address] [place], [place]  [address] [phone]  Fax  [phone]  Problem List 1. Coronary artery disease- recent heart catheterization revealed no significant coronary irregular irregularities 2. anxiety 3. Hyperlipidemia 4. Hypertension 5. Hypothyroidism 6. Chronic back pain  History of Present Illness:  The 57 yo  female with a history of coronary artery disease. She's status post PTCA and stenting of her left anterior descending artery. She also had Cutting Balloon procedure and reexpansion of that stent in January 2010.  She was admitted recently to the hospital for chest pain an dizziness and cath revealed no significant irregularities.  He is walking twice a week. She's not having episodes of angina with walking. 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  October 27, 2012:  Sarah Ballard is doing well from a cardiac standpoint.   She is having some back pain - needs to have a back injection but does not want to stop the Plavix.    Current Outpatient Prescriptions on File Prior to Visit  Medication Sig Dispense Refill  . ALPRAZolam (XANAX) 0.5 MG tablet TAKE 1-3 TABLETS BY MOUTH DAILY AS DIRECTED  30 tablet  0  . aspirin 81 MG tablet Take 81 mg by mouth daily.        . Calcium Carbonate-Vitamin D (CALTRATE 600+D) 600-400 MG-UNIT per tablet Take 1 tablet by mouth daily.        . Chlorpheniramine-DM (CORICIDIN HBP COUGH/COLD PO) Take by mouth.      . clopidogrel (PLAVIX) 75 MG tablet TAKE 1 TABLET BY MOUTH DAILY  90 tablet  3  . cyclobenzaprine (FLEXERIL) 10 MG tablet Take 10 mg by mouth as needed.      . docusate sodium (COLACE) 100 MG capsule Take 100 mg by mouth as needed.        Marland Kitchen estradiol (ESTRACE) 0.1 MG/GM vaginal cream Place 0.25 Applicatorfuls vaginally daily.  42.5 g  3  . Hydrocodone-Acetaminophen (VICODIN PO) Take by mouth as needed.      Marland Kitchen  ibuprofen (ADVIL,MOTRIN) 200 MG tablet Take 600 mg by mouth every 6 (six) hours as needed. For pain      . irbesartan (AVAPRO) 150 MG tablet TAKE 1 TABLET BY MOUTH DAILY  90 tablet  2  . levothyroxine (SYNTHROID, LEVOTHROID) 125 MCG tablet Take 125 mcg by mouth daily. 1 TABLET DAILY, 1/2 TABLET ON Sunday.      . metoprolol (LOPRESSOR) 50 MG tablet TAKE 1/2 TABLET BY MOUTH 2 TIMES A DAY  180 tablet  3  . mirabegron ER (MYRBETRIQ) 25 MG TB24 Take 25 mg by mouth daily.      . nitroGLYCERIN (NITROSTAT) 0.4 MG SL tablet Place 0.4 mg under the tongue every 5 (five) minutes as needed. For chest pain      . omega-3 acid ethyl esters (LOVAZA) 1 G capsule Take 2 g by mouth 2 (two) times daily.        Marland Kitchen oxymetazoline (AFRIN) 0.05 % nasal spray Place 2 sprays into the nose 2 (two) times daily.      . polyethylene glycol (MIRALAX / GLYCOLAX) packet Take 17 g by mouth daily as needed. For constipation      . rosuvastatin (CRESTOR) 20 MG tablet Take 1 tablet (20 mg total) by mouth daily.  90 tablet  0  . [DISCONTINUED] fluticasone (FLONASE) 50 MCG/ACT nasal spray Place 2 sprays  into the nose as needed. For allergy        No current facility-administered medications on file prior to visit.    Allergies  Allergen Reactions  . Latex     itching  . Lisinopril Cough  . Tramadol Hcl     REACTION: nausea, vomiting  . Ultram (Tramadol)     Past Medical History  Diagnosis Date  . Hypothyroidism   . Hyperlipemia   . Hypertension   . Anxiety   . Degenerative disc disease   . CAD (coronary artery disease) 2005    s/p PCI LAD and cutting balloon angioplasty  with stent reexpansion 04/2008  . Angina pectoris 2005    with stent placement Anterior decscending  . Positive PPD     neg. CXR, treated with INH for 1 yr.  about 24  . MI (myocardial infarction) 2005    angio with stent  . Hypercholesterolemia     Past Surgical History  Procedure Laterality Date  . Coronary angioplasty  2009  . Nerve root  block  2007 & 2013    epidural steroids  . Coronary angioplasty with stent placement  2005  . Tubal ligation    . Tonsillectomy    . Laparoscopy      for endometriosis  . Ruptured disc  2007    with epidural     History  Smoking status  . Never Smoker   Smokeless tobacco  . Not on file    History  Alcohol Use  . 0.6 oz/week  . 1 Glasses of wine per week    Comment: occ wine    Family History  Problem Relation Age of Onset  . Colon cancer Mother 75  . Stroke Mother   . Hypertension Mother   . Diabetes Mother   . Rheum arthritis Mother   . Heart attack Father     x2  . Diabetes Father   . Stroke Maternal Grandmother     Reviw of Systems:  Reviewed in the HPI.  All other systems are negative.  Physical Exam: BP 104/82  Pulse 66  Ht 5\' 6"  (1.676 m)  Wt 178 lb 12.8 oz (81.103 kg)  BMI 28.87 kg/m2  LMP 06/29/2006 The patient is alert and oriented x 3.  The mood and affect are normal.   Skin: warm and dry.  Color is normal.    HEENT:   the sclera are nonicteric.  The mucous membranes are moist.  The carotids are 2+ without bruits.  There is no thyromegaly.  There is no JVD.    Lungs: clear.  The chest wall is non tender.    Heart: regular rate with a normal S1 and S2.  There are no murmurs, gallops, or rubs. The PMI is not displaced.     Abdomen: good bowel sounds.  There is no guarding or rebound.  There is no hepatosplenomegaly or tenderness.  There are no masses.   Extremities:  no clubbing, cyanosis, or edema.  The legs are without rashes.    Her right radial cath site is well-healed. There is a small scar associated with her site. Her pulses are intact.  Neuro:  Cranial nerves II - XII are intact.  Motor and sensory functions are intact.    The gait is normal.  ECG :  October 27, 2012:  NR at 51. No ST or T wave changes. Assessment / Plan:

## 2012-10-28 LAB — BASIC METABOLIC PANEL
Calcium: 8.9 mg/dL (ref 8.4–10.5)
GFR: 76.02 mL/min (ref >60.00)
Glucose, Bld: 95 mg/dL (ref 70–99)
Potassium: 3.9 mEq/L (ref 3.5–5.1)
Sodium: 139 mEq/L (ref 135–145)

## 2012-10-28 LAB — HEPATIC FUNCTION PANEL
ALT: 34 U/L (ref 0–35)
AST: 30 U/L (ref 0–37)
Total Bilirubin: 0.9 mg/dL (ref 0.3–1.2)
Total Protein: 6.5 g/dL (ref 6.0–8.3)

## 2012-10-28 LAB — LIPID PANEL
Cholesterol: 160 mg/dL (ref 0–200)
HDL: 54.7 mg/dL (ref >39.00)
Triglycerides: 78 mg/dL (ref 0.0–149.0)
VLDL: 15.6 mg/dL (ref 0.0–40.0)

## 2012-10-29 ENCOUNTER — Telehealth: Payer: Self-pay | Admitting: Cardiovascular Disease

## 2012-10-29 NOTE — Telephone Encounter (Signed)
New Problem:    Patient called in returning your call. Please call back. 

## 2012-10-29 NOTE — Telephone Encounter (Signed)
RESULTS REVIEWED

## 2012-10-30 ENCOUNTER — Emergency Department (HOSPITAL_COMMUNITY)
Admission: EM | Admit: 2012-10-30 | Discharge: 2012-10-30 | Disposition: A | Discharge status: Home / Self Care | Payer: 59 | Source: Home / Self Care | Attending: Emergency Medicine | Admitting: Emergency Medicine

## 2012-10-30 ENCOUNTER — Emergency Department (INDEPENDENT_AMBULATORY_CARE_PROVIDER_SITE_OTHER): Payer: 59

## 2012-10-30 ENCOUNTER — Encounter (HOSPITAL_COMMUNITY): Payer: Self-pay | Admitting: Emergency Medicine

## 2012-10-30 DIAGNOSIS — J209 Acute bronchitis, unspecified: Secondary | ICD-10-CM

## 2012-10-30 MED ORDER — HYDROCOD POLST-CHLORPHEN POLST 10-8 MG/5ML PO LQCR: 5.0000 mL | Freq: Two times a day (BID) | ORAL | Status: DC | PRN

## 2012-10-30 MED ORDER — ALBUTEROL SULFATE HFA 108 (90 BASE) MCG/ACT IN AERS: 1.0000 | INHALATION_SPRAY | Freq: Four times a day (QID) | RESPIRATORY_TRACT | Status: DC | PRN

## 2012-10-30 MED ORDER — ALBUTEROL SULFATE (5 MG/ML) 0.5% IN NEBU
INHALATION_SOLUTION | RESPIRATORY_TRACT | Status: AC
  Filled 2012-10-30: qty 0.5

## 2012-10-30 MED ORDER — ALBUTEROL SULFATE (5 MG/ML) 0.5% IN NEBU
2.5000 mg | INHALATION_SOLUTION | Freq: Once | RESPIRATORY_TRACT | Status: AC
  Administered 2012-10-30: 2.5 mg via RESPIRATORY_TRACT

## 2012-10-30 MED ORDER — FLUTICASONE PROPIONATE 50 MCG/ACT NA SUSP: 2.0000 | Freq: Every day | NASAL | Status: DC

## 2012-10-30 NOTE — ED Notes (Signed)
Patient gone to xray 

## 2012-10-30 NOTE — ED Provider Notes (Signed)
Medical screening examination/treatment/procedure(s) were performed by non-physician practitioner and as supervising physician I was immediately available for consultation/collaboration.  Helina Hullum, M.D.  Natthew Marlatt C Frantz Quattrone, MD 10/30/12 1939 

## 2012-10-30 NOTE — ED Provider Notes (Signed)
History    CSN: [id number] Arrival date & time 10/30/12  1504  First MD Initiated Contact with Patient 10/30/12 1646     Chief Complaint  Patient presents with  . Cough   (Consider location/radiation/quality/duration/timing/severity/associated sxs/prior Treatment) HPI Comments: 57 year old female presents complaining of cough, sore throat, mild headache for 5 days. She has been around her husband has similar symptoms for the past 2 weeks. She has been taking DayQuil, NyQuil, and Mucinex without much relief. The cough has been productive of amounts of sputum and is worse at night. She denies fever, chest pain, shortness of breath, or pleuritic pain.  Patient is a 57 y.o. female presenting with cough.  Cough Associated symptoms: headaches, rhinorrhea and sore throat   Associated symptoms: no chest pain, no chills, no ear pain, no fever, no myalgias, no rash, no shortness of breath and no wheezing    Past Medical History  Diagnosis Date  . Hypothyroidism   . Hyperlipemia   . Hypertension   . Anxiety   . Degenerative disc disease   . CAD (coronary artery disease) 2005    s/p PCI LAD and cutting balloon angioplasty  with stent reexpansion 04/2008  . Angina pectoris 2005    with stent placement Anterior decscending  . Positive PPD     neg. CXR, treated with INH for 1 yr.  about 57  . MI (myocardial infarction) 2005    angio with stent  . Hypercholesterolemia    Past Surgical History  Procedure Laterality Date  . Coronary angioplasty  2009  . Nerve root block  2007 & 2013    epidural steroids  . Coronary angioplasty with stent placement  2005  . Tubal ligation    . Tonsillectomy    . Laparoscopy      for endometriosis  . Ruptured disc  2007    with epidural    Family History  Problem Relation Age of Onset  . Colon cancer Mother 88  . Stroke Mother   . Hypertension Mother   . Diabetes Mother   . Rheum arthritis Mother   . Heart attack Father     x2  . Diabetes  Father   . Stroke Maternal Grandmother    History  Substance Use Topics  . Smoking status: Never Smoker   . Smokeless tobacco: Not on file  . Alcohol Use: 0.6 oz/week    1 Glasses of wine per week     Comment: occ wine   OB History   Grav Para Term Preterm Abortions TAB SAB Ect Mult Living   4 3             Review of Systems  Constitutional: Negative for fever and chills.  HENT: Positive for congestion, sore throat, rhinorrhea and postnasal drip. Negative for ear pain, nosebleeds, sneezing, trouble swallowing, neck pain, voice change, sinus pressure and ear discharge.   Eyes: Negative for visual disturbance.  Respiratory: Positive for cough. Negative for chest tightness, shortness of breath, wheezing and stridor.   Cardiovascular: Negative for chest pain, palpitations and leg swelling.  Gastrointestinal: Negative for nausea, vomiting and abdominal pain.  Endocrine: Negative for polydipsia and polyuria.  Genitourinary: Negative for dysuria, urgency and frequency.  Musculoskeletal: Negative for myalgias and arthralgias.  Skin: Negative for rash.  Neurological: Positive for headaches. Negative for dizziness, weakness and light-headedness.    Allergies  Latex; Lisinopril; Tramadol hcl; and Ultram  Home Medications   Current Outpatient Rx  Name  Route  Sig  Dispense  Refill  . albuterol (PROVENTIL HFA;VENTOLIN HFA) 108 (90 BASE) MCG/ACT inhaler   Inhalation   Inhale 1-2 puffs into the lungs every 6 (six) hours as needed.   1 Inhaler   0   . ALPRAZolam (XANAX) 0.5 MG tablet      TAKE 1-3 TABLETS BY MOUTH DAILY AS DIRECTED   30 tablet   0     Pt to get from pcp   . aspirin 81 MG tablet   Oral   Take 81 mg by mouth daily.           . Calcium Carbonate-Vitamin D (CALTRATE 600+D) 600-400 MG-UNIT per tablet   Oral   Take 1 tablet by mouth daily.           . Chlorpheniramine-DM (CORICIDIN HBP COUGH/COLD PO)   Oral   Take by mouth.         .  chlorpheniramine-HYDROcodone (TUSSIONEX PENNKINETIC ER) 10-8 MG/5ML LQCR   Oral   Take 5 mLs by mouth every 12 (twelve) hours as needed.   200 mL   0   . clopidogrel (PLAVIX) 75 MG tablet      TAKE 1 TABLET BY MOUTH DAILY   90 tablet   3   . cyclobenzaprine (FLEXERIL) 10 MG tablet   Oral   Take 10 mg by mouth as needed.         . docusate sodium (COLACE) 100 MG capsule   Oral   Take 100 mg by mouth as needed.           Marland Kitchen estradiol (ESTRACE) 0.1 MG/GM vaginal cream   Vaginal   Place 0.25 Applicatorfuls vaginally daily.   42.5 g   3   . fluticasone (FLONASE) 50 MCG/ACT nasal spray   Nasal   Place 2 sprays into the nose daily.   1 g   2   . Hydrocodone-Acetaminophen (VICODIN PO)   Oral   Take by mouth as needed.         Marland Kitchen ibuprofen (ADVIL,MOTRIN) 200 MG tablet   Oral   Take 600 mg by mouth every 6 (six) hours as needed. For pain         . irbesartan (AVAPRO) 150 MG tablet      TAKE 1 TABLET BY MOUTH DAILY   90 tablet   2   . levothyroxine (SYNTHROID, LEVOTHROID) 125 MCG tablet   Oral   Take 125 mcg by mouth daily. 1 TABLET DAILY, 1/2 TABLET ON Sunday.         . metoprolol (LOPRESSOR) 50 MG tablet      TAKE 1/2 TABLET BY MOUTH 2 TIMES A DAY   180 tablet   3   . mirabegron ER (MYRBETRIQ) 25 MG TB24   Oral   Take 25 mg by mouth daily.         . nitroGLYCERIN (NITROSTAT) 0.4 MG SL tablet   Sublingual   Place 0.4 mg under the tongue every 5 (five) minutes as needed. For chest pain         . omega-3 acid ethyl esters (LOVAZA) 1 G capsule   Oral   Take 2 g by mouth 2 (two) times daily.           Marland Kitchen oxymetazoline (AFRIN) 0.05 % nasal spray   Nasal   Place 2 sprays into the nose 2 (two) times daily.         . polyethylene glycol (MIRALAX / GLYCOLAX) packet  Oral   Take 17 g by mouth daily as needed. For constipation         . rosuvastatin (CRESTOR) 20 MG tablet   Oral   Take 1 tablet (20 mg total) by mouth daily.   90 tablet    0     No refills until appointment    BP 120/80  Pulse 80  Temp(Src) 98.3 F (36.8 C) (Oral)  Resp 18  SpO2 100%  LMP 06/29/2006 Physical Exam  Nursing note and vitals reviewed. Constitutional: She is oriented to person, place, and time. Vital signs are normal. She appears well-developed and well-nourished. No distress.  HENT:  Head: Atraumatic.  Eyes: EOM are normal. Pupils are equal, round, and reactive to light.  Cardiovascular: Normal rate, regular rhythm and normal heart sounds.  Exam reveals no gallop and no friction rub.   No murmur heard. Pulmonary/Chest: Effort normal and breath sounds normal. No respiratory distress. She has no wheezes. She has no rales.  Abdominal: Soft. There is no tenderness.  Neurological: She is alert and oriented to person, place, and time. She has normal strength.  Skin: Skin is warm and dry. She is not diaphoretic.  Psychiatric: She has a normal mood and affect. Her behavior is normal. Judgment normal.    ED Course  Procedures (including critical care time) Labs Reviewed - No data to display Dg Chest 2 View  10/30/2012   *RADIOLOGY REPORT*  Clinical Data: Cough  CHEST - 2 VIEW  Comparison: Prior radiograph from 06/26/2011  Findings: Cardiac and mediastinal silhouettes are within normal limits, and stable as compared to the prior exam.  The lungs are well inflated.  No airspace consolidation to suggest acute infectious pneumonitis is identified.  There is no pleural effusion or pulmonary edema.  No pneumothorax.  No acute osseous abnormality.  IMPRESSION: No acute cardiopulmonary process.   Original Report Authenticated By: Rise Mu, M.D.   1. Bronchitis, acute     MDM  X-ray is normal at hands patient's cough was significantly improved with albuterol breathing treatment. We'll treat symptomatically and have her followup if she gets worse.   Meds ordered this encounter  Medications  . albuterol (PROVENTIL) (5 MG/ML) 0.5% nebulizer  solution 2.5 mg    Sig:   . fluticasone (FLONASE) 50 MCG/ACT nasal spray    Sig: Place 2 sprays into the nose daily.    Dispense:  1 g    Refill:  2  . chlorpheniramine-HYDROcodone (TUSSIONEX PENNKINETIC ER) 10-8 MG/5ML LQCR    Sig: Take 5 mLs by mouth every 12 (twelve) hours as needed.    Dispense:  200 mL    Refill:  0  . albuterol (PROVENTIL HFA;VENTOLIN HFA) 108 (90 BASE) MCG/ACT inhaler    Sig: Inhale 1-2 puffs into the lungs every 6 (six) hours as needed.    Dispense:  1 Inhaler    Refill:  0     Graylon Good, PA-C 10/30/12 1816

## 2012-10-30 NOTE — ED Notes (Signed)
Reports hacking cough, productive cough, and phlegm that is yellow to light tinged sputum, runny nose, and headache.  Onset of symptoms Monday 10/27/12

## 2012-11-12 ENCOUNTER — Other Ambulatory Visit: Payer: Self-pay | Admitting: Cardiovascular Disease

## 2012-12-02 ENCOUNTER — Other Ambulatory Visit: Payer: Self-pay | Admitting: Cardiovascular Disease

## 2013-02-28 IMAGING — CR DG CHEST 2V
2 series · 2 of 2 positions shown · non-contrast
Comparison: 08/28/2010

CLINICAL DATA: Chest pain.  Coronary artery disease.  Hypertension.

CHEST - 2 VIEW

[w chest pa]
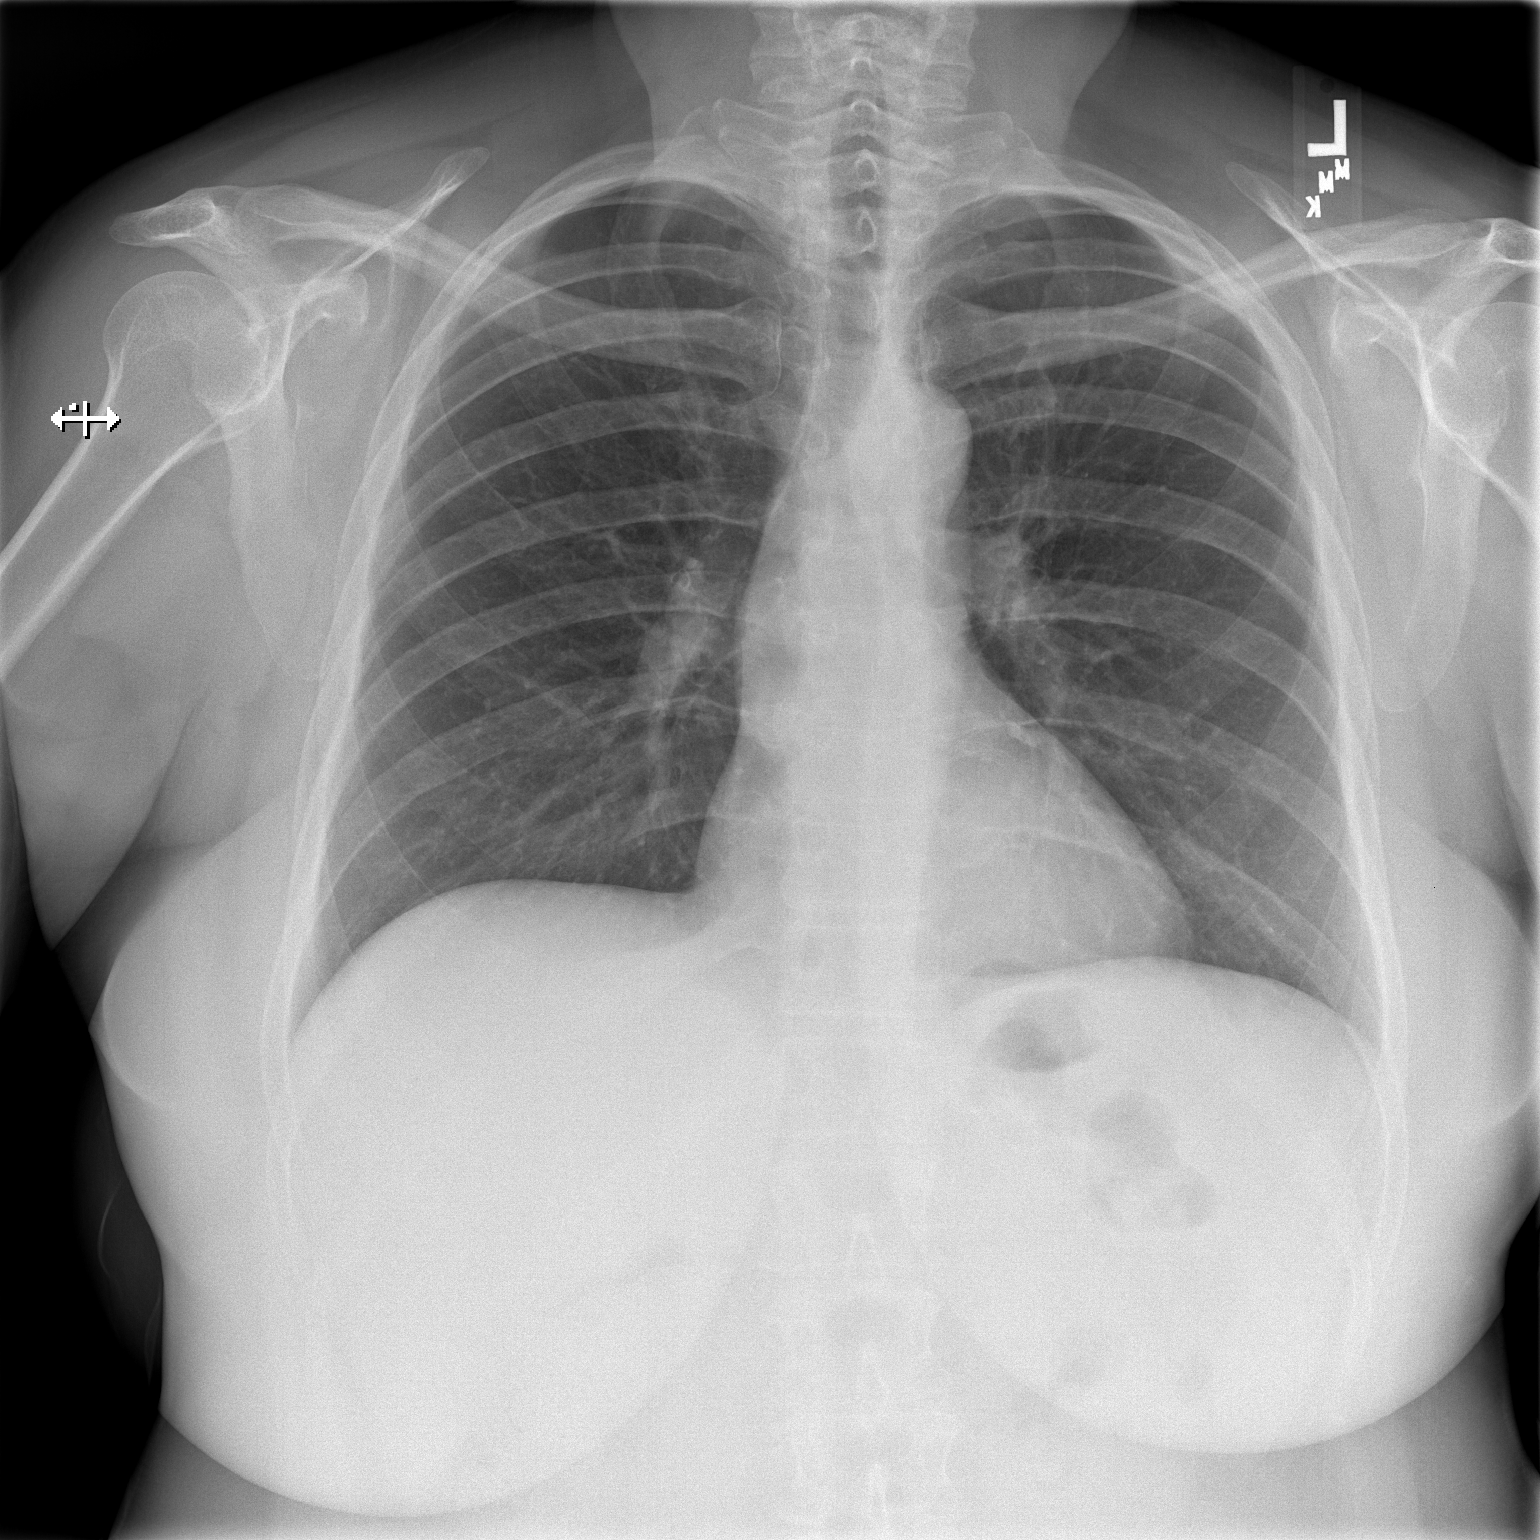

[w chest lat]
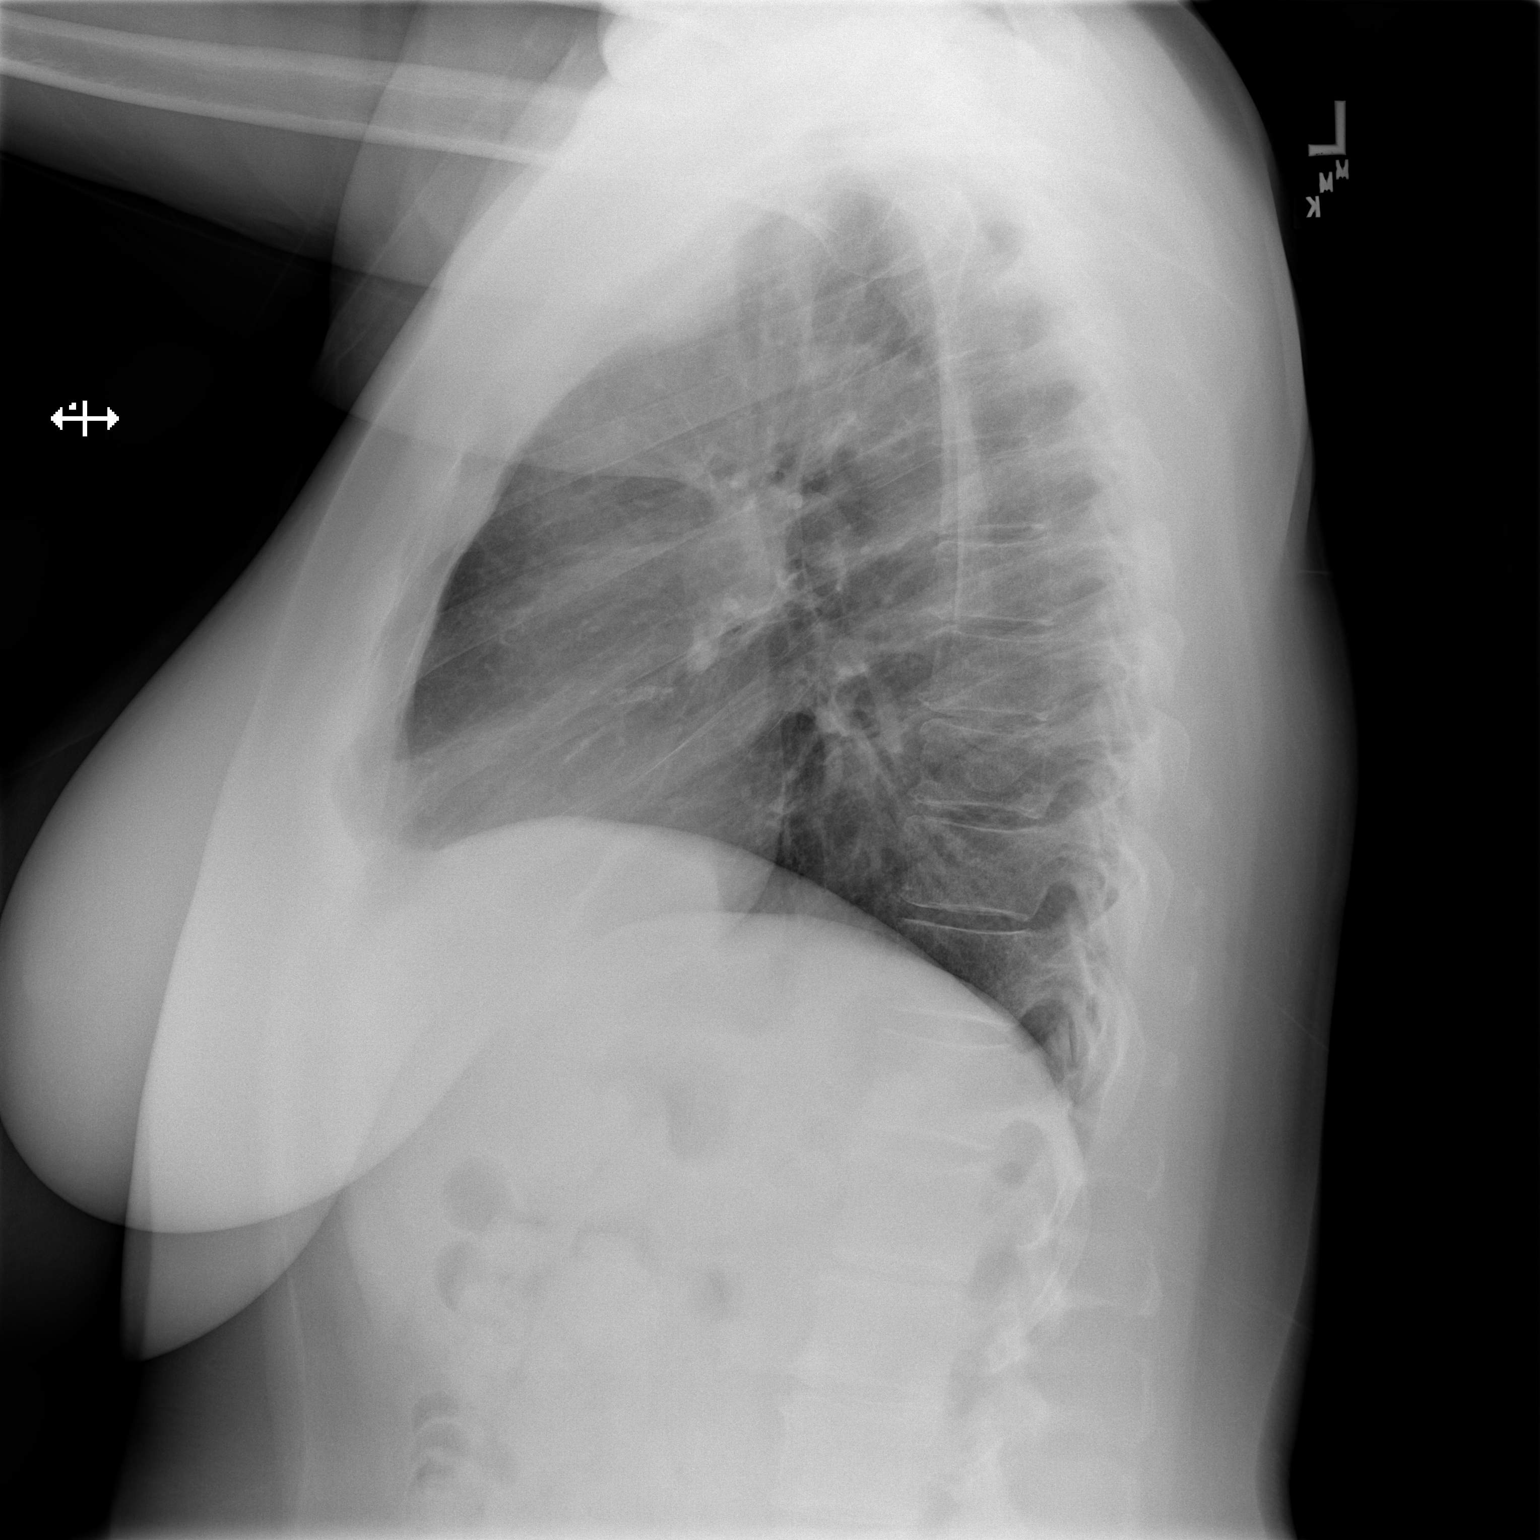

[2 of 2 positions shown; findings below may reference images not displayed]

FINDINGS: The heart size and mediastinal contours are within
normal limits.  Both lungs are clear.  The visualized skeletal
structures are unremarkable.
IMPRESSION: No active cardiopulmonary disease.

## 2013-03-17 ENCOUNTER — Other Ambulatory Visit: Payer: Self-pay | Admitting: Cardiovascular Disease

## 2013-04-01 ENCOUNTER — Other Ambulatory Visit (HOSPITAL_COMMUNITY): Payer: Self-pay | Admitting: Neurosurgery

## 2013-04-01 DIAGNOSIS — M545 Low back pain, unspecified: Secondary | ICD-10-CM

## 2013-04-07 ENCOUNTER — Encounter (HOSPITAL_COMMUNITY): Payer: Self-pay

## 2013-04-07 ENCOUNTER — Ambulatory Visit (HOSPITAL_COMMUNITY)
Admission: RE | Admit: 2013-04-07 | Discharge: 2013-04-07 | Disposition: A | Discharge status: Home / Self Care | Payer: 59 | Source: Ambulatory Visit | Attending: Neurosurgery | Admitting: Neurosurgery

## 2013-04-07 DIAGNOSIS — M545 Low back pain, unspecified: Secondary | ICD-10-CM

## 2013-04-07 DIAGNOSIS — M47817 Spondylosis without myelopathy or radiculopathy, lumbosacral region: Secondary | ICD-10-CM | POA: Insufficient documentation

## 2013-04-07 DIAGNOSIS — M5137 Other intervertebral disc degeneration, lumbosacral region: Secondary | ICD-10-CM | POA: Insufficient documentation

## 2013-04-07 DIAGNOSIS — M51379 Other intervertebral disc degeneration, lumbosacral region without mention of lumbar back pain or lower extremity pain: Secondary | ICD-10-CM | POA: Insufficient documentation

## 2013-04-07 DIAGNOSIS — R209 Unspecified disturbances of skin sensation: Secondary | ICD-10-CM | POA: Insufficient documentation

## 2013-06-19 ENCOUNTER — Other Ambulatory Visit: Payer: Self-pay | Admitting: Cardiovascular Disease

## 2013-06-29 ENCOUNTER — Encounter: Payer: Self-pay | Admitting: Nurse Practitioner

## 2013-09-24 ENCOUNTER — Ambulatory Visit: Payer: 59 | Admitting: Nurse Practitioner

## 2013-09-29 ENCOUNTER — Encounter: Payer: Self-pay | Admitting: Nurse Practitioner

## 2013-09-29 ENCOUNTER — Ambulatory Visit (INDEPENDENT_AMBULATORY_CARE_PROVIDER_SITE_OTHER): Payer: 59 | Admitting: Nurse Practitioner

## 2013-09-29 VITALS — BP 98/68 | HR 78 | Resp 16 | Ht 65.0 in | Wt 173.0 lb

## 2013-09-29 DIAGNOSIS — Z0000 Encounter for general adult medical examination without abnormal findings: Secondary | ICD-10-CM

## 2013-09-29 DIAGNOSIS — Z01419 Encounter for gynecological examination (general) (routine) without abnormal findings: Secondary | ICD-10-CM

## 2013-09-29 DIAGNOSIS — R82998 Other abnormal findings in urine: Secondary | ICD-10-CM

## 2013-09-29 DIAGNOSIS — R8290 Unspecified abnormal findings in urine: Secondary | ICD-10-CM

## 2013-09-29 LAB — POCT URINALYSIS DIPSTICK
Urobilinogen, UA: NEGATIVE
pH, UA: 5

## 2013-09-29 MED ORDER — ESTRADIOL 0.1 MG/GM VA CREA: TOPICAL_CREAM | VAGINAL | Status: DC

## 2013-09-29 NOTE — Patient Instructions (Signed)

## 2013-09-29 NOTE — Progress Notes (Signed)
58 y.o. G4P3 Married Caucasian Fe here for annual exam.  Still having an increase in vaso symptoms - worse during the day. Using vaginal cream maybe once a week. Father age 70 lives with her and she feels overwhelmed at times with his care.  Patient's last menstrual period was 06/29/2006.          Sexually active: yes  The current method of family planning is tubal ligation.    Exercising: no  The patient does not participate in regular exercise at present. Smoker:  no  Health Maintenance: Pap:  09/06/11 NEG HR HPV MMG:  02/19/13 Bi-Rads 1  Colonoscopy:  04/2009 Polyps f/u 5 years  BMD:   02/28/10  TDaP:  03/12/2007 Labs: Shon Baton, MD ; Urine: Trace Leuk's    reports that she has never smoked. She does not have any smokeless tobacco history on file. She reports that she drinks about .6 ounces of alcohol per week. She reports that she does not use illicit drugs.  Past Medical History  Diagnosis Date  . Hypothyroidism   . Hyperlipemia   . Hypertension   . Anxiety   . Degenerative disc disease   . CAD (coronary artery disease) 2005    s/p PCI LAD and cutting balloon angioplasty  with stent reexpansion 04/2008  . Angina pectoris 2005    with stent placement Anterior decscending  . Positive PPD     neg. CXR, treated with INH for 1 yr.  about 66  . MI (myocardial infarction) 2005    angio with stent  . Hypercholesterolemia   . Bone spur     Right Hip    Past Surgical History  Procedure Laterality Date  . Coronary angioplasty  2009  . Nerve root block  2007 & 2013    epidural steroids  . Coronary angioplasty with stent placement  2005  . Tubal ligation    . Tonsillectomy    . Laparoscopy      for endometriosis  . Ruptured disc  2007    with epidural     Current Outpatient Prescriptions  Medication Sig Dispense Refill  . ALPRAZolam (XANAX) 0.5 MG tablet TAKE 1-3 TABLETS BY MOUTH DAILY AS DIRECTED  30 tablet  0  . aspirin 81 MG tablet Take 81 mg by mouth daily.        .  Calcium Carbonate-Vitamin D (CALTRATE 600+D) 600-400 MG-UNIT per tablet Take 1 tablet by mouth daily.        . clopidogrel (PLAVIX) 75 MG tablet TAKE 1 TABLET BY MOUTH DAILY  90 tablet  3  . CRESTOR 20 MG tablet TAKE 1 TABLET BY MOUTH ONCE DAILY  90 tablet  1  . cyclobenzaprine (FLEXERIL) 10 MG tablet Take 10 mg by mouth as needed.      . diclofenac sodium (VOLTAREN) 1 % GEL Apply 1 g topically 4 (four) times daily.      Marland Kitchen escitalopram (LEXAPRO) 10 MG tablet Take 10 mg by mouth daily.      Marland Kitchen estradiol (ESTRACE) 0.1 MG/GM vaginal cream Pea size amount to urethra 3 times a week at HS  42.5 g  3  . fesoterodine (TOVIAZ) 4 MG TB24 tablet Take 4 mg by mouth daily.      . irbesartan (AVAPRO) 150 MG tablet TAKE 1 TABLET BY MOUTH DAILY  90 tablet  3  . levothyroxine (SYNTHROID, LEVOTHROID) 125 MCG tablet Take 125 mcg by mouth daily. 1 TABLET DAILY, 1/2 TABLET ON Sunday.      Marland Kitchen  metoprolol (LOPRESSOR) 50 MG tablet TAKE 1/2 TABLET BY MOUTH TWICE DAILY  90 tablet  2  . omega-3 acid ethyl esters (LOVAZA) 1 G capsule Take 2 g by mouth 2 (two) times daily.        . Chlorpheniramine-DM (CORICIDIN HBP COUGH/COLD PO) Take by mouth.      . docusate sodium (COLACE) 100 MG capsule Take 100 mg by mouth as needed.        . fluticasone (FLONASE) 50 MCG/ACT nasal spray Place 2 sprays into the nose daily.  1 g  2  . Hydrocodone-Acetaminophen (VICODIN PO) Take by mouth as needed.      Marland Kitchen ibuprofen (ADVIL,MOTRIN) 200 MG tablet Take 600 mg by mouth every 6 (six) hours as needed. For pain      . nitroGLYCERIN (NITROSTAT) 0.4 MG SL tablet Place 0.4 mg under the tongue every 5 (five) minutes as needed. For chest pain      . oxymetazoline (AFRIN) 0.05 % nasal spray Place 2 sprays into the nose 2 (two) times daily.      . polyethylene glycol (MIRALAX / GLYCOLAX) packet Take 17 g by mouth daily as needed. For constipation       No current facility-administered medications for this visit.    Family History  Problem Relation Age  of Onset  . Colon cancer Mother 44  . Stroke Mother   . Hypertension Mother   . Diabetes Mother   . Rheum arthritis Mother   . Heart attack Father     x2  . Diabetes Father   . Stroke Maternal Grandmother     ROS:  Pertinent items are noted in HPI.  Otherwise, a comprehensive ROS was negative.  Exam:   BP 98/68  Pulse 78  Resp 16  Ht 5\' 5"  (1.651 m)  Wt 173 lb (78.472 kg)  BMI 28.79 kg/m2  LMP 06/29/2006 Height: 5\' 5"  (165.1 cm)  Ht Readings from Last 3 Encounters:  09/29/13 5\' 5"  (1.651 m)  10/27/12 5\' 6"  (1.676 m)  09/18/12 5\' 6"  (1.676 m)    General appearance: alert, cooperative and appears stated age Head: Normocephalic, without obvious abnormality, atraumatic Neck: no adenopathy, supple, symmetrical, trachea midline and thyroid normal to inspection and palpation Lungs: clear to auscultation bilaterally Breasts: normal appearance, no masses or tenderness Heart: regular rate and rhythm Abdomen: soft, non-tender; no masses,  no organomegaly Extremities: extremities normal, atraumatic, no cyanosis or edema Skin: Skin color, texture, turgor normal. No rashes or lesions.  Several bruises on legs Lymph nodes: Cervical, supraclavicular, and axillary nodes normal. No abnormal inguinal nodes palpated Neurologic: Grossly normal   Pelvic: External genitalia:  no lesions              Urethra:  normal appearing urethra with no masses, tenderness or lesions              Bartholin's and Skene's: normal                 Vagina: very atrophic appearing vagina with pale color and discharge, no lesions              Cervix: anteverted              Pap taken: no Bimanual Exam:  Uterus:  normal size, contour, position, consistency, mobility, non-tender              Adnexa: no mass, fullness, tenderness  Rectovaginal: Confirms               Anus:  normal sphincter tone, no lesions  A:  Well Woman with normal exam  Postmenopausal no HRT  History of MI and stents, HTN,  on anticoagulation  Atrophic vaginitis with dyspareunia  Situational anxiety and stress  History of OAB and sees urologist  R/O UTI    P:   Reviewed health and wellness pertinent to exam  Pap smear not taken today  Mammogram is due 10/14  Refill on Estrace vaginal - cautioned that if she is off Plavix needs to come off med's  Counseled about risk of DVT, CVA, cancer, etc.  Counseled on breast self exam, mammography screening, adequate intake of calcium and vitamin D return annually or prn  An After Visit Summary was printed and given to the patient.

## 2013-09-30 LAB — URINALYSIS, MICROSCOPIC ONLY
Bacteria, UA: NONE SEEN
CASTS: NONE SEEN
Crystals: NONE SEEN
Squamous Epithelial / LPF: NONE SEEN

## 2013-10-02 ENCOUNTER — Other Ambulatory Visit: Payer: Self-pay | Admitting: Nurse Practitioner

## 2013-10-02 LAB — URINE CULTURE

## 2013-10-02 MED ORDER — CIPROFLOXACIN HCL 500 MG PO TABS: 500.0000 mg | ORAL_TABLET | Freq: Two times a day (BID) | ORAL | Status: DC

## 2013-10-02 NOTE — Progress Notes (Signed)
Encounter reviewed by Dr. Shenea Giacobbe Silva.  

## 2013-10-13 ENCOUNTER — Telehealth: Payer: Self-pay | Admitting: Nurse Practitioner

## 2013-10-13 DIAGNOSIS — N390 Urinary tract infection, site not specified: Secondary | ICD-10-CM

## 2013-10-13 NOTE — Telephone Encounter (Signed)
Patient just finished taking medication for a UTI and is not sure if she needs to come back in to see Edman Circle.

## 2013-10-13 NOTE — Telephone Encounter (Signed)
Left message to call [place] at [phone].   Patient needs two week TOC appointment per results note from [place] Cage, [place] on 09/29/2013.

## 2013-10-14 NOTE — Telephone Encounter (Signed)
Spoke with patient. Patient would like to schedule TOC for next week as this week she has a lot going on with work and a recent death in the family. Patient requesting appointment for next Tuesday. Appointment scheduled for next Tuesday at 2:30pm for urine TOC. Patient agreeable to date and time.  Routing to provider for final review. Patient agreeable to disposition. Will close encounter

## 2013-10-14 NOTE — Telephone Encounter (Signed)
Order placed for urine TOC.  Routing to provider for final review. Patient agreeable to disposition. Will close encounter.

## 2013-10-14 NOTE — Addendum Note (Signed)
Addended by: Rolla Etienne E on: 10/14/2013 12:14 PM   Modules accepted: Orders

## 2013-10-20 ENCOUNTER — Telehealth: Payer: Self-pay | Admitting: *Deleted

## 2013-10-20 ENCOUNTER — Ambulatory Visit (INDEPENDENT_AMBULATORY_CARE_PROVIDER_SITE_OTHER): Payer: 59 | Admitting: *Deleted

## 2013-10-20 VITALS — BP 130/80 | HR 76 | Temp 98.3°F | Resp 16 | Ht 65.0 in

## 2013-10-20 DIAGNOSIS — E785 Hyperlipidemia, unspecified: Secondary | ICD-10-CM

## 2013-10-20 DIAGNOSIS — I2510 Atherosclerotic heart disease of native coronary artery without angina pectoris: Secondary | ICD-10-CM

## 2013-10-20 DIAGNOSIS — I10 Essential (primary) hypertension: Secondary | ICD-10-CM

## 2013-10-20 DIAGNOSIS — N390 Urinary tract infection, site not specified: Secondary | ICD-10-CM

## 2013-10-20 LAB — HEPATIC FUNCTION PANEL
ALBUMIN: 4.1 g/dL (ref 3.5–5.2)
ALT: 19 U/L (ref 0–35)
AST: 23 U/L (ref 0–37)
Alkaline Phosphatase: 51 U/L (ref 39–117)
Bilirubin, Direct: 0.1 mg/dL (ref 0.0–0.3)
TOTAL PROTEIN: 6.6 g/dL (ref 6.0–8.3)
Total Bilirubin: 0.9 mg/dL (ref 0.2–1.2)

## 2013-10-20 LAB — BASIC METABOLIC PANEL
BUN: 14 mg/dL (ref 6–23)
CALCIUM: 9 mg/dL (ref 8.4–10.5)
CHLORIDE: 104 meq/L (ref 96–112)
CO2: 32 meq/L (ref 19–32)
Creatinine, Ser: 0.7 mg/dL (ref 0.4–1.2)
GFR: 92.47 mL/min (ref >60.00)
GLUCOSE: 94 mg/dL (ref 70–99)
Potassium: 3.2 mEq/L — ABNORMAL LOW (ref 3.5–5.1)
SODIUM: 140 meq/L (ref 135–145)

## 2013-10-20 LAB — LIPID PANEL
CHOL/HDL RATIO: 2
Cholesterol: 152 mg/dL (ref 0–200)
HDL: 72.4 mg/dL (ref >39.00)
LDL CALC: 69 mg/dL (ref 0–99)
NONHDL: 79.6
Triglycerides: 51 mg/dL (ref 0.0–149.0)
VLDL: 10.2 mg/dL (ref 0.0–40.0)

## 2013-10-20 NOTE — Telephone Encounter (Signed)
Lmtcb re: potassium 3.2. Per Dr. Tamala Julian (DOD), pt should replace potassium naturally with potassium -rich foods, and have lab repeated when she comes for visit with Dr. Acie Fredrickson on 6/30. Order placed for BMET 10/27/13.

## 2013-10-20 NOTE — Progress Notes (Signed)
Patient in for TOC. Patient states she is done with Abx and denies any Sx.  - Patient requesting [facility] if she needs to pick up Rx Friday afternoon or over the weekend. All other days she goes to [facility].

## 2013-10-22 LAB — URINE CULTURE

## 2013-10-23 ENCOUNTER — Telehealth: Payer: Self-pay

## 2013-10-23 DIAGNOSIS — A491 Streptococcal infection, unspecified site: Secondary | ICD-10-CM

## 2013-10-23 MED ORDER — AMPICILLIN 500 MG PO CAPS: 500.0000 mg | ORAL_CAPSULE | Freq: Four times a day (QID) | ORAL | Status: DC

## 2013-10-23 MED ORDER — FLUCONAZOLE 150 MG PO TABS: ORAL_TABLET | ORAL | Status: DC

## 2013-10-23 NOTE — Telephone Encounter (Signed)
Message copied by Jasmine Awe on Fri Oct 23, 2013 11:13 AM ------      Message from: [place], BROOK E      Created: Thu Oct 22, 2013  6:53 PM       Verline Lema,            This is Dr. Quincy Simmonds stepping in to check Patty's labs:            Please contact patient with urine culture showing Group B Strep.      Please give her 2 prescriptions from me      Ampicillin 500mg  po q 6 hours x 5 days. #20 RF none.      Diflucan 150 mg po prn yeast infection.  #2 RF none.             Needs test of cure in 10 more days.            Thanks,            [place] ------

## 2013-10-23 NOTE — Telephone Encounter (Signed)
Spoke with patient. Advised of message from [place] as seen below. Patient agreeable and verbalizes understanding. [facility] Appointment for [facility] scheduled for July 7th at 10am. Patient agreeable to date and time.  Routing to Dr.Silva as covering CC: Sarah Cage, FNP   Routing to provider for final review. Patient agreeable to disposition. Will close encounter

## 2013-10-27 ENCOUNTER — Ambulatory Visit (INDEPENDENT_AMBULATORY_CARE_PROVIDER_SITE_OTHER): Payer: 59 | Admitting: Cardiovascular Disease

## 2013-10-27 ENCOUNTER — Encounter: Payer: Self-pay | Admitting: Cardiovascular Disease

## 2013-10-27 VITALS — BP 110/90 | HR 70 | Ht 65.0 in | Wt 170.0 lb

## 2013-10-27 DIAGNOSIS — I2510 Atherosclerotic heart disease of native coronary artery without angina pectoris: Secondary | ICD-10-CM

## 2013-10-27 DIAGNOSIS — E785 Hyperlipidemia, unspecified: Secondary | ICD-10-CM

## 2013-10-27 DIAGNOSIS — I10 Essential (primary) hypertension: Secondary | ICD-10-CM

## 2013-10-27 LAB — BASIC METABOLIC PANEL
BUN: 13 mg/dL (ref 6–23)
CALCIUM: 9 mg/dL (ref 8.4–10.5)
CO2: 30 mEq/L (ref 19–32)
Chloride: 103 mEq/L (ref 96–112)
Creatinine, Ser: 0.8 mg/dL (ref 0.4–1.2)
GFR: 83.98 mL/min (ref >60.00)
Glucose, Bld: 104 mg/dL — ABNORMAL HIGH (ref 70–99)
POTASSIUM: 3.6 meq/L (ref 3.5–5.1)
SODIUM: 138 meq/L (ref 135–145)

## 2013-10-27 NOTE — Progress Notes (Signed)
Sarah Ballard Date of Birth  03-17-1956 [facility] [address]    [place], [place]  [address] [phone]  Fax  [phone]  Problem List 1. Coronary artery disease-  2. anxiety 3. Hyperlipidemia 4. Hypertension 5. Hypothyroidism 6. Chronic back pain  History of Present Illness:  The 58 yo  female with a history of coronary artery disease. She's status post PTCA and stenting of her left anterior descending artery. She also had Cutting Balloon procedure and reexpansion of that stent in January 2010.  She was admitted recently to the hospital for chest pain an dizziness and cath revealed no significant irregularities.  He is walking twice a week. She's not having episodes of angina with walking. 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  October 27, 2012:  Sarah Ballard is doing well from a cardiac standpoint.   She is having some back pain - needs to have a back injection but does not want to stop the Plavix.    October 27, 2013:  Sarah Ballard is doing well.  She denies any chest pain.  Bumping into lots of things and has bruising on her legs.   Her potassium was low at her last check.    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 ALPRAZolam (XANAX) 0.5 MG tablet TAKE 1-3 TABLETS BY MOUTH DAILY AS DIRECTED  30 tablet  0  . ampicillin (PRINCIPEN) 500 MG capsule Take 1 capsule (500 mg total) by mouth 4 (four) times daily.  20 capsule  0  . aspirin 81 MG tablet Take 81 mg by mouth daily.        . Calcium Carbonate-Vitamin D (CALTRATE 600+D) 600-400 MG-UNIT per tablet Take 1 tablet by mouth daily.        . Chlorpheniramine-DM (CORICIDIN HBP COUGH/COLD PO) Take by mouth.      . clopidogrel (PLAVIX) 75 MG tablet TAKE 1 TABLET BY MOUTH DAILY  90 tablet  3  . CRESTOR 20 MG tablet TAKE 1 TABLET BY MOUTH ONCE DAILY   90 tablet  1  . cyclobenzaprine (FLEXERIL) 10 MG tablet Take 10 mg by mouth as needed.      . diclofenac sodium (VOLTAREN) 1 % GEL Apply 1 g topically 4 (four) times daily.      Marland Kitchen docusate sodium (COLACE) 100 MG capsule Take 100 mg by mouth as needed.        Marland Kitchen escitalopram (LEXAPRO) 10 MG tablet Take 10 mg by mouth daily.      Marland Kitchen estradiol (ESTRACE) 0.1 MG/GM vaginal cream Pea size amount to urethra 3 times a week at HS  42.5 g  3  . fesoterodine (TOVIAZ) 4 MG TB24 tablet Take 4 mg by mouth daily.      . fluconazole (DIFLUCAN) 150 MG tablet PRN for yeast infection  2 tablet  0  . fluticasone (FLONASE) 50 MCG/ACT nasal spray Place 2 sprays into the nose daily.  1 g  2  . Hydrocodone-Acetaminophen (VICODIN PO) Take by mouth as needed.      Marland Kitchen ibuprofen (ADVIL,MOTRIN) 200 MG tablet Take 600 mg by mouth every 6 (six) hours as needed. For pain      . irbesartan (AVAPRO) 150 MG tablet TAKE 1 TABLET BY MOUTH DAILY  90 tablet  3  . levothyroxine (SYNTHROID, LEVOTHROID) 125 MCG tablet  Take 125 mcg by mouth daily. 1 TABLET DAILY      . metoprolol (LOPRESSOR) 50 MG tablet TAKE 1/2 TABLET BY MOUTH TWICE DAILY  90 tablet  2  . nitroGLYCERIN (NITROSTAT) 0.4 MG SL tablet Place 0.4 mg under the tongue every 5 (five) minutes as needed. For chest pain      . omega-3 acid ethyl esters (LOVAZA) 1 G capsule Take 2 g by mouth 2 (two) times daily.        Marland Kitchen oxymetazoline (AFRIN) 0.05 % nasal spray Place 2 sprays into the nose 2 (two) times daily.      . polyethylene glycol (MIRALAX / GLYCOLAX) packet Take 17 g by mouth daily as needed. For constipation       No current facility-administered medications on file prior to visit.    Allergies  Allergen Reactions  . Latex     itching  . Lisinopril Cough  . Tramadol Hcl     REACTION: nausea, vomiting  . Ultram [Tramadol]     Past Medical History  Diagnosis Date  . Hypothyroidism   . Hyperlipemia   . Hypertension   . Anxiety   . Degenerative disc disease   .  CAD (coronary artery disease) 2005    s/p PCI LAD and cutting balloon angioplasty  with stent reexpansion 04/2008  . Angina pectoris 2005    with stent placement Anterior decscending  . Positive PPD     neg. CXR, treated with INH for 1 yr.  about 68  . MI (myocardial infarction) 2005    angio with stent  . Hypercholesterolemia   . Bone spur     Right Hip    Past Surgical History  Procedure Laterality Date  . Coronary angioplasty  2009  . Nerve root block  2007 & 2013    epidural steroids  . Coronary angioplasty with stent placement  2005  . Tubal ligation    . Tonsillectomy    . Laparoscopy      for endometriosis  . Ruptured disc  2007    with epidural     History  Smoking status  . Never Smoker   Smokeless tobacco  . Not on file    History  Alcohol Use  . 0.6 oz/week  . 1 Glasses of wine per week    Comment: occ wine    Family History  Problem Relation Age of Onset  . Colon cancer Mother 75  . Stroke Mother   . Hypertension Mother   . Diabetes Mother   . Rheum arthritis Mother   . Heart attack Father     x2  . Diabetes Father   . Stroke Maternal Grandmother     Reviw of Systems:  Reviewed in the HPI.  All other systems are negative.  Physical Exam: BP 110/90  Pulse 70  Ht 5\' 5"  (1.651 m)  Wt 170 lb (77.111 kg)  BMI 28.29 kg/m2  LMP 06/29/2006 The patient is alert and oriented x 3.  The mood and affect are normal.   Skin: warm and dry.  Color is normal.    HEENT:   the sclera are nonicteric.  The mucous membranes are moist.  The carotids are 2+ without bruits.  There is no thyromegaly.  There is no JVD.    Lungs: clear.  The chest wall is non tender.    Heart: regular rate with a normal S1 and S2.  There are no murmurs, gallops, or rubs. The PMI is not  displaced.     Abdomen: good bowel sounds.  There is no guarding or rebound.  There is no hepatosplenomegaly or tenderness.  There are no masses.   Extremities:  no clubbing, cyanosis, or  edema.  The legs are without rashes.    Her right radial cath site is well-healed. There is a small scar associated with her site. Her pulses are intact.  Neuro:  Cranial nerves II - XII are intact.  Motor and sensory functions are intact.    The gait is normal.  ECG :  October 27, 2013:  NSR at 70, no acute changes.  Assessment / Plan:

## 2013-10-27 NOTE — Assessment & Plan Note (Signed)
Her LDL is 69.  Continue current meds Encouraged her to exercise

## 2013-10-27 NOTE — Assessment & Plan Note (Addendum)
Sarah Ballard is doing well from a caridac standpoint.   She has a hx of stent to her LAD in 2005.     Will encourage her to diet , exercise and lose weight.  I'll see her in 1 year.

## 2013-10-27 NOTE — Patient Instructions (Signed)
Your physician recommends that you continue on your current medications as directed. Please refer to the Current Medication list given to you today.  Your physician recommends that you have lab work today:  Basic metabolic panel  Your physician wants you to follow-up in: 1 year with Dr. Acie Fredrickson.  You will receive a reminder letter in the mail two months in advance. If you don't receive a letter, please call our office to schedule the follow-up appointment.

## 2013-10-27 NOTE — Assessment & Plan Note (Signed)
Stable

## 2013-11-03 ENCOUNTER — Ambulatory Visit (INDEPENDENT_AMBULATORY_CARE_PROVIDER_SITE_OTHER): Payer: 59 | Admitting: *Deleted

## 2013-11-03 VITALS — BP 104/76 | HR 60 | Temp 97.8°F | Resp 18

## 2013-11-03 DIAGNOSIS — B951 Streptococcus, group B, as the cause of diseases classified elsewhere: Secondary | ICD-10-CM

## 2013-11-03 DIAGNOSIS — A491 Streptococcal infection, unspecified site: Secondary | ICD-10-CM

## 2013-11-03 NOTE — Progress Notes (Addendum)
Patient in today for TOC. Patient states she finished Abx but didn't have to take her Diflucan. She denies any Sx. Advise pt to call us if any sx or concerns.

## 2013-11-05 LAB — URINE CULTURE: Colony Count: 25000

## 2013-11-06 ENCOUNTER — Telehealth: Payer: Self-pay | Admitting: Nurse Practitioner

## 2013-11-06 ENCOUNTER — Other Ambulatory Visit: Payer: Self-pay | Admitting: Nurse Practitioner

## 2013-11-06 MED ORDER — NITROFURANTOIN MONOHYD MACRO 100 MG PO CAPS: 100.0000 mg | ORAL_CAPSULE | Freq: Two times a day (BID) | ORAL | Status: DC

## 2013-11-06 NOTE — Telephone Encounter (Signed)
Patient just spoke with Colletta Maryland and some more questions regarding fer urine results. Please call ASAP. Patient is very worried, is wondering why so many UTI's. Patient is fearful she may have a fissure. Patient in on the way to the beach and doesn't want to go all weekend with out talking with a nurse. Patient is very anxious.

## 2013-11-06 NOTE — Telephone Encounter (Signed)
When she comes in for [facility]  -see if we can put her on my schedule

## 2013-11-06 NOTE — Telephone Encounter (Signed)
Spoke with patient. Patient states that she is very concerned about how she is getting e.coli UTI's. "I always wipe correctly and I am scared that I have a fissure or something causing this. My mother has colon cancer. I am not able to get a clean urine catch. It is so hard. I do not know if this is it or not. I am on vacation and I am going to find it hard to enjoy it worrying." Advised patient to took macrobid and this should hopefully get rid of UTI. Advised I could send a message over to Milford Cage, FNP to see if she has any further recommendations. Patient is already seen with urology and does not want to see them for this. Advised to complete antibiotics and we will be a TOC in two weeks. Advised we are not worried that this could be anything else at this time. Patient denies any symptoms. Patient states "I feel a little better about it all now. I will take the antibiotics and will call when I return from vacation on Tuesday to schedule test of cure."   Routing to provider for final review. Patient agreeable to disposition. Will close encounter.

## 2013-11-10 NOTE — Telephone Encounter (Signed)
Return call to patient to advise of patty's response. LMTCB. No TOC appointment scheduled yet.

## 2013-11-12 ENCOUNTER — Telehealth: Payer: Self-pay | Admitting: Nurse Practitioner

## 2013-11-12 NOTE — Telephone Encounter (Signed)
Spoke with patient at time of incoming call. Worked last night overnight 12, hour shift at [employer]. She states a new rash on legs and arms developed over night, rash is very different all over, some flat, some some raised and is patchy all over. Prior, she had some blisters and rash on face, but patient attributed this to sun screen use over the weekend while at the beach. Denies sob or respiratory issues. Has one day left of Macrobid treatment. Patient using benadryl without relief of symptoms and feels she may need steroid treatment.  Advised would discuss with Milford Cage, FNP and return her call, patient will try to call dermatologist at this time as well.   Spoke with Milford Cage, FNP. Orders to [place] and office visit if cannot see dermatologist today. Also, office visit to discuss treatment for test of cure.   Called patient back. She will see dermatology today at 1130. She will dc Macrobid and call back with any change in symptoms. Patient is scheduled for office visit with Milford Cage, [place] on 11/17/13 to recheck urine and discuss.   Routing to provider for final review. Patient agreeable to disposition. Will close encounter

## 2013-11-12 NOTE — Telephone Encounter (Signed)
Patient has been on 3 different types of antibiotics for uti. She is now having red rashy areas all over her legs, arms and face. She is feeling nauseous. Said she never felt any symptoms for the UTI. She said she went to the beach this past weekend and was in the sun some. Thinks her face looks more like sun poison. She said she feels like she needs a steroid to help get rid of this. Said she has a one day left of her antibiotic but is afraid to take it. She isnt sure if she needs to be seen here or if she needs to go to her dermatologist. She doesn't know if she is having an allergic reaction or if it was the sun reacting with antibiotics. Said she used 30 spf on Saturday and 51 spt on Sunday. She put a different cream on her face and didn't realize it was expired until after she had put it on. Said her face is swollen. Said the rash just popped up on arms and legs last night. But has been on face since earlier in week.

## 2013-11-17 ENCOUNTER — Ambulatory Visit: Payer: 59 | Admitting: Nurse Practitioner

## 2013-11-17 ENCOUNTER — Encounter: Payer: Self-pay | Admitting: Nurse Practitioner

## 2013-11-17 ENCOUNTER — Ambulatory Visit (INDEPENDENT_AMBULATORY_CARE_PROVIDER_SITE_OTHER): Payer: 59 | Admitting: Nurse Practitioner

## 2013-11-17 VITALS — BP 120/78 | HR 72 | Temp 97.6°F | Resp 16 | Ht 65.0 in

## 2013-11-17 DIAGNOSIS — N390 Urinary tract infection, site not specified: Secondary | ICD-10-CM

## 2013-11-17 DIAGNOSIS — R319 Hematuria, unspecified: Principal | ICD-10-CM

## 2013-11-17 NOTE — Progress Notes (Signed)
Encounter reviewed by Dr. Josefa Half.  I recommend stopping the Toviaz, as this can cause urinary retention and therefore increased risk of urinary tract infections.  I cannot tell from the encounter if the 735 cc was a post void residual or just a random catheterization.  At any rate, this is a large bladder volume.   She will need to follow up with urology.

## 2013-11-17 NOTE — Progress Notes (Signed)
S:  58 y.o.Married Caucasian female presents with concerns about recurrent UTI. Symptoms began on  09/29/13.   She had come in for AEX and had + leuk's on her urine.  The urine culture showed E-Coli and was treated with Macrobid.  At [facility] her urine culture showed Group Beta strep and was treated with Ampicillin for 5 days.  Then on next TOC she again had E coli and completed another course of Macrobid except the last 2 tablets secondary to nausea.  She now had no urinary symptoms but just feels bad in general.  She has been on  course of steroids and attributes her not feeling well with fatigue.  She is also followed by Dr. Darrol Angel and taking Lisbeth Ply for urinary stress incontinence. Next appointment with him is in October.  Pertinent negatives include no constitutional symptoms, denying fever, chills, anorexia, or weight loss.  Not sexually active and symptoms are not related to post coital.    Menopausal yes. Vaginal dryness yes.  Same partner without change. Last UTI documented was last month.  She works as a Marine scientist in [employer] and is very careful about hand washing and using public restrooms.  ROS: no weight loss, fever, night sweats, feels well except for fatigued  O General:  Alert, oriented to person, place, and time, normal mood, behavior, speech, dress, motor activity, and thought processes and not in acute distress, well developed and well nourished  Mild pressure across the bladder  No CVA tenderness  external genitalia normal, no adnexal masses or tenderness, uterus normal size, shape, and consistency and vagina normal without discharge  An in/out catheter is placed to obtain a sterile urine for culture - urine volume received was 735 cc.   Diagnostic Test:    Urinalysis rbc- 0-5 - maybe secondary to cath.     Assessment: TOC for UTI - recurrent   Sees Urologist for mixed urinary incontinence and is on Toviaz 4 mg   Maybe having urinary retention and therefor recurrent UTI's   Atrophic  vaginitis - on Estrace        Plan:  Agreement to wait for urine culture and if needed may go back to see Urologist.     RV

## 2013-11-18 LAB — URINALYSIS, MICROSCOPIC ONLY
BACTERIA UA: NONE SEEN
Casts: NONE SEEN
Crystals: NONE SEEN
SQUAMOUS EPITHELIAL / LPF: NONE SEEN

## 2013-11-19 ENCOUNTER — Telehealth: Payer: Self-pay | Admitting: *Deleted

## 2013-11-19 LAB — URINE CULTURE
Colony Count: NO GROWTH
Organism ID, Bacteria: NO GROWTH

## 2013-11-19 NOTE — Telephone Encounter (Signed)
Left message for patient to return call.

## 2013-11-19 NOTE — Telephone Encounter (Signed)
Message copied by Graylon Good on Thu Nov 19, 2013 10:02 AM ------      Message from: Regina Eck      Created: Thu Nov 19, 2013  8:50 AM       Notify urine micro and culture negative ------

## 2013-11-19 NOTE — Telephone Encounter (Signed)
Pt notified in result note.  

## 2013-11-20 ENCOUNTER — Other Ambulatory Visit: Payer: 59

## 2013-11-20 NOTE — Telephone Encounter (Signed)
Patient was seen on 7/21 for appointment with Milford Cage, [place]. Had urine micro which was negative. Patient states that she feels much better and is no longer having symptoms.   Routing to provider for final review. Patient agreeable to disposition. Will close encounter

## 2013-11-26 NOTE — Progress Notes (Signed)
Sarah Ballard,  I think that it may be wise to stop the patient's Toviaz due to her large urine residual volume. I recommend that she follow up with urology.

## 2013-12-03 ENCOUNTER — Other Ambulatory Visit: Payer: Self-pay | Admitting: Cardiovascular Disease

## 2013-12-29 ENCOUNTER — Other Ambulatory Visit: Payer: Self-pay | Admitting: Cardiovascular Disease

## 2014-01-19 ENCOUNTER — Other Ambulatory Visit: Payer: Self-pay | Admitting: Cardiovascular Disease

## 2014-03-01 ENCOUNTER — Encounter: Payer: Self-pay | Admitting: Nurse Practitioner

## 2014-04-08 ENCOUNTER — Encounter (HOSPITAL_COMMUNITY): Payer: Self-pay | Admitting: Cardiovascular Disease

## 2014-04-21 ENCOUNTER — Other Ambulatory Visit: Payer: Self-pay | Admitting: Cardiovascular Disease

## 2014-04-21 MED ORDER — NITROGLYCERIN 0.4 MG SL SUBL: 0.4000 mg | SUBLINGUAL_TABLET | SUBLINGUAL | Status: DC | PRN

## 2014-08-03 ENCOUNTER — Other Ambulatory Visit: Payer: Self-pay | Admitting: Cardiovascular Disease

## 2014-09-30 ENCOUNTER — Ambulatory Visit: Payer: 59 | Admitting: Cardiovascular Disease

## 2014-10-05 ENCOUNTER — Ambulatory Visit (INDEPENDENT_AMBULATORY_CARE_PROVIDER_SITE_OTHER): Payer: 59 | Admitting: Nurse Practitioner

## 2014-10-05 ENCOUNTER — Encounter: Payer: Self-pay | Admitting: Nurse Practitioner

## 2014-10-05 VITALS — BP 102/70 | HR 76 | Resp 20 | Ht 65.0 in | Wt 160.0 lb

## 2014-10-05 DIAGNOSIS — Z0000 Encounter for general adult medical examination without abnormal findings: Secondary | ICD-10-CM | POA: Diagnosis not present

## 2014-10-05 DIAGNOSIS — R8290 Unspecified abnormal findings in urine: Secondary | ICD-10-CM

## 2014-10-05 DIAGNOSIS — Z01419 Encounter for gynecological examination (general) (routine) without abnormal findings: Secondary | ICD-10-CM

## 2014-10-05 MED ORDER — ESTRADIOL 0.1 MG/GM VA CREA: TOPICAL_CREAM | VAGINAL | Status: DC

## 2014-10-05 MED ORDER — FLUCONAZOLE 150 MG PO TABS
150.0000 mg | ORAL_TABLET | Freq: Once | ORAL | Status: DC
Start: 2014-10-05 — End: 2014-11-03

## 2014-10-05 MED ORDER — FLUCONAZOLE 150 MG PO TABS: 150.0000 mg | ORAL_TABLET | Freq: Once | ORAL | Status: DC

## 2014-10-05 NOTE — Patient Instructions (Signed)

## 2014-10-05 NOTE — Progress Notes (Signed)
59 y.o. G4P3 Separated Caucasian Fe here for annual exam.  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  Has used OTC Monistat 1 day and 7 days of cream BID.  Some relief but not better.  The OAB is better with Toviaz.  Would like to have a consult with Dr.Silva. Separated from second husband of 7.5 years last fall.  The first husband - father of the children died 07/05/12.  Patient's last menstrual period was 06/29/2006.          Sexually active: Yes.    The current method of family planning is tubal ligation and post menopausal status.    Exercising: Yes.    Walking, stationary bike 2-3 x weekly Smoker:  no  Health Maintenance: Pap: 09/06/11 neg. HR HPV:Neg MMG:  02/22/14 BIRADS2:Benign  Self Breast Exam: yes, Occ Colonoscopy: 04/2010 polyps - repeat 5 years BMD:  2011 - normal TDaP: 2008  Shingles:  Not yet Labs: PCP UA: trace leuk's   reports that she has never smoked. She has never used smokeless tobacco. She reports that she drinks about 0.6 oz of alcohol per week. She reports that she does not use illicit drugs.  Past Medical History  Diagnosis Date  . Hypothyroidism   . Hyperlipemia   . Hypertension   . Anxiety   . Degenerative disc disease   . CAD (coronary artery disease) 2005    s/p PCI LAD and cutting balloon angioplasty  with stent reexpansion 04/2008  . Angina pectoris 2005    with stent placement Anterior decscending  . Positive PPD     neg. CXR, treated with INH for 1 yr.  about 48  . MI (myocardial infarction) 2005    angio with stent  . Hypercholesterolemia   . Bone spur     Right Hip    Past Surgical History  Procedure Laterality Date  . Coronary angioplasty  2009  . Nerve root block  2007 & 2013    epidural steroids  . Coronary angioplasty with stent placement  2005  . Tubal ligation    . Tonsillectomy    . Laparoscopy      for endometriosis  . Ruptured disc  2007    with epidural   . Left heart catheterization  with coronary angiogram Bilateral 06/27/2011    Procedure: LEFT HEART CATHETERIZATION WITH CORONARY ANGIOGRAM;  Surgeon: Burnell Blanks, MD;  Location: [facility] CATH LAB;  Service: Cardiovascular;  Laterality: Bilateral;  . Tongue biopsy      keratin accumulation - Benign     Current Outpatient Prescriptions  Medication Sig Dispense Refill  . ALPRAZolam (XANAX) 0.5 MG tablet TAKE 1-3 TABLETS BY MOUTH DAILY AS DIRECTED 30 tablet 0  . aspirin 81 MG tablet Take 81 mg by mouth daily.      . Calcium Carbonate-Vitamin D (CALTRATE 600+D) 600-400 MG-UNIT per tablet Take 1 tablet by mouth daily as needed.     . clopidogrel (PLAVIX) 75 MG tablet TAKE 1 TABLET BY MOUTH DAILY 90 tablet 3  . CRESTOR 20 MG tablet TAKE 1 TABLET BY MOUTH ONCE DAILY 90 tablet 3  . cyclobenzaprine (FLEXERIL) 10 MG tablet Take 10 mg by mouth as needed.    . diclofenac sodium (VOLTAREN) 1 % GEL Apply 1 g topically as needed.     Marland Kitchen escitalopram (LEXAPRO) 10 MG tablet Take 10 mg by mouth daily.    Marland Kitchen estradiol (ESTRACE) 0.1 MG/GM vaginal cream Pea  size amount to urethra 3 times a week at HS 42.5 g 3  . fesoterodine (TOVIAZ) 4 MG TB24 tablet Take 4 mg by mouth daily.    . fluticasone (FLONASE) 50 MCG/ACT nasal spray Place 2 sprays into the nose daily. (Patient taking differently: Place 2 sprays into the nose daily as needed. ) 1 g 2  . glucosamine-chondroitin 500-400 MG tablet Take 1 tablet by mouth 3 (three) times daily.    . Hydrocodone-Acetaminophen (VICODIN PO) Take by mouth as needed.    Marland Kitchen ibuprofen (ADVIL,MOTRIN) 200 MG tablet Take 600 mg by mouth every 6 (six) hours as needed. For pain    . irbesartan (AVAPRO) 150 MG tablet TAKE 1 TABLET BY MOUTH DAILY 90 tablet 3  . levothyroxine (SYNTHROID, LEVOTHROID) 125 MCG tablet Take 125 mcg by mouth daily. 1 TABLET DAILY    . magnesium 30 MG tablet Take 30 mg by mouth 2 (two) times daily.    . metoprolol (LOPRESSOR) 50 MG tablet TAKE 1/2 TABLET BY MOUTH TWICE DAILY 90 tablet 2  .  omega-3 acid ethyl esters (LOVAZA) 1 G capsule Take 2 g by mouth 2 (two) times daily.      Marland Kitchen oxymetazoline (AFRIN) 0.05 % nasal spray Place 2 sprays into the nose 2 (two) times daily as needed.     . polyethylene glycol (MIRALAX / GLYCOLAX) packet Take 17 g by mouth daily as needed. For constipation    . docusate sodium (COLACE) 100 MG capsule Take 100 mg by mouth as needed.      . fluconazole (DIFLUCAN) 150 MG tablet Take 1 tablet (150 mg total) by mouth once. Take one tablet.  Repeat in 48 hours if symptoms are not completely resolved. 2 tablet 0  . nitroGLYCERIN (NITROSTAT) 0.4 MG SL tablet Place 1 tablet (0.4 mg total) under the tongue every 5 (five) minutes as needed. For chest pain (Patient not taking: Reported on 10/05/2014) 25 tablet 0   No current facility-administered medications for this visit.    Family History  Problem Relation Age of Onset  . Colon cancer Mother 20  . Stroke Mother   . Hypertension Mother   . Diabetes Mother   . Rheum arthritis Mother   . Heart attack Father     x2  . Diabetes Father   . Kidney disease Father   . Stroke Maternal Grandmother   . Esophageal cancer Cousin 61    ROS:  Pertinent items are noted in HPI.  Otherwise, a comprehensive ROS was negative.  Exam:   BP 102/70 mmHg  Pulse 76  Resp 20  Ht 5\' 5"  (1.651 m)  Wt 160 lb (72.576 kg)  BMI 26.63 kg/m2  LMP 06/29/2006 Height: 5\' 5"  (165.1 cm) Ht Readings from Last 3 Encounters:  10/05/14 5\' 5"  (1.651 m)  11/17/13 5\' 5"  (1.651 m)  10/27/13 5\' 5"  (1.651 m)    General appearance: alert, cooperative and appears stated age Head: Normocephalic, without obvious abnormality, atraumatic Neck: no adenopathy, supple, symmetrical, trachea midline and thyroid normal to inspection and palpation Lungs: clear to auscultation bilaterally Breasts: normal appearance, no masses or tenderness Heart: regular rate and rhythm Abdomen: soft, non-tender; no masses,  no organomegaly Extremities: extremities  normal, atraumatic, no cyanosis or edema Skin: Skin color, texture, turgor normal. No rashes or lesions Lymph nodes: Cervical, supraclavicular, and axillary nodes normal. No abnormal inguinal nodes palpated Neurologic: Grossly normal   Pelvic: External genitalia:  no lesions, yeast vulvar irritation  Urethra:  prolapse urethra with no masses, tenderness or lesions              Bartholin's and Skene's: normal                 Vagina: atrophic and stenotic appearing vagina with pale color and no discharge, no lesions              Cervix: anteverted              Pap taken: Yes.   Bimanual Exam:  Uterus:  normal size, contour, position, consistency, mobility, non-tender              Adnexa: no mass, fullness, tenderness               Rectovaginal: Confirms               Anus:  normal sphincter tone, no lesions  Chaperone present: Yes  A:  Well Woman with normal exam  Postmenopausal no HRT History of MI and stents, HTN, on anticoagulation Atrophic vaginitis with dyspareunia Situational anxiety and stress, marital discord History of OAB and sees urologist R/O UTI, yeast vulvar irritation   P:   Reviewed health and wellness pertinent to exam  Pap smear as above  Mammogram is due 01/2015  Will follow with urine C&S  Diflucan 150 mg X 2  Refilled Estrace cream to use pea size amount to the urethra 2 times a week  She is aware of risk with DVT, CVA, cancer - also aware if off Plavix to come off cream  Counseled on breast self exam, mammography screening, adequate intake of calcium and vitamin D, diet and exercise, Kegel's exercises return annually or prn  An After Visit Summary was printed and given to the patient.

## 2014-10-06 LAB — URINALYSIS, MICROSCOPIC ONLY
Bacteria, UA: NONE SEEN
Casts: NONE SEEN
Crystals: NONE SEEN

## 2014-10-06 NOTE — Progress Notes (Signed)
Encounter reviewed by Dr. Brook Silva.  

## 2014-10-07 LAB — IPS PAP TEST WITH HPV

## 2014-10-09 LAB — URINE CULTURE

## 2014-10-11 ENCOUNTER — Other Ambulatory Visit: Payer: Self-pay | Admitting: Nurse Practitioner

## 2014-10-11 ENCOUNTER — Telehealth: Payer: Self-pay | Admitting: Nurse Practitioner

## 2014-10-11 MED ORDER — CIPROFLOXACIN HCL 500 MG PO TABS: 500.0000 mg | ORAL_TABLET | Freq: Two times a day (BID) | ORAL | Status: DC

## 2014-10-11 NOTE — Telephone Encounter (Signed)
Notes Recorded by Nicholes Rough, CMA on 10/11/2014 at 8:55 AM LMTCO-eh Notes Recorded by Kem Boroughs, FNP on 10/11/2014 at 8:45 AM Please let pt . Know that urine culture was positive and she needs to start on Cipro - Rx sent to pharmacy. pap 02

## 2014-10-11 NOTE — Telephone Encounter (Signed)
Spoke with patient. Advised of message as seen below from Milford Cage, [place]. Patient is agreeable and verbalizes understanding. Would like rx sent to [place]. Rx transferred for Cipro 500 mg BID #[id number] to [facility]. Patient is agreeable. Urine TOC scheduled for 6/28 at 10:30am. Patient is agreeable to date and time.  Routing to provider for final review. Patient agreeable to disposition. Will close encounter.

## 2014-10-11 NOTE — Telephone Encounter (Signed)
Patient says Sarah Ballard called her she thinks about results. No telephone message in system.

## 2014-10-18 ENCOUNTER — Other Ambulatory Visit: Payer: Self-pay | Admitting: Cardiovascular Disease

## 2014-10-26 ENCOUNTER — Ambulatory Visit (INDEPENDENT_AMBULATORY_CARE_PROVIDER_SITE_OTHER): Payer: 59 | Admitting: *Deleted

## 2014-10-26 VITALS — BP 108/70 | HR 72 | Resp 16 | Wt 160.0 lb

## 2014-10-26 DIAGNOSIS — R8290 Unspecified abnormal findings in urine: Secondary | ICD-10-CM

## 2014-10-26 NOTE — Progress Notes (Signed)
Patient ID: Sarah Ballard, female   DOB: [birth year], 59 y.o.   MRN: [id number] Patient here for a urine recheck TOC. Patient denies any urinary symptoms.

## 2014-10-27 LAB — URINE CULTURE
COLONY COUNT: NO GROWTH
Organism ID, Bacteria: NO GROWTH

## 2014-11-03 ENCOUNTER — Encounter: Payer: Self-pay | Admitting: Obstetrics and Gynecology

## 2014-11-03 ENCOUNTER — Ambulatory Visit (INDEPENDENT_AMBULATORY_CARE_PROVIDER_SITE_OTHER): Payer: 59 | Admitting: Obstetrics and Gynecology

## 2014-11-03 VITALS — BP 122/76 | HR 64 | Ht 65.0 in | Wt 159.6 lb

## 2014-11-03 DIAGNOSIS — N3946 Mixed incontinence: Secondary | ICD-10-CM | POA: Diagnosis not present

## 2014-11-03 DIAGNOSIS — N812 Incomplete uterovaginal prolapse: Secondary | ICD-10-CM

## 2014-11-03 NOTE — Progress Notes (Signed)
Patient ID: Sarah Ballard, female   DOB: [birth year], 59 y.o.   MRN: [id number] GYNECOLOGY  VISIT   HPI: 59 y.o.   Married  Caucasian  female   G4P3 with Patient's last menstrual period was 06/29/2006.   here for evaluation of overactive bladder. Patient would like to come off Lisbeth Ply because it is so expensive.  Works well without a lot of constipation.  Dr. Matilde Sprang prescribing.  Did urodynamic testing confirming retention and overactive bladder.  Uncertain if it documented stress incontinence.  Cystoscopy done and it showed petechiae (is on Plavix and baby ASA due to cardiac stent.)  UC 10/26/14 negative.   Some urgency to void. Not emptying completely.  Double voiding.  This is improved since taking the Toviaz.   Loss of urine with cough and sneeze.  This is not a frequent event. No leak with exercise.  Does the stationary bike.  Does not leak for no reason.   DF - 6 - 8 times per day.  NF 2 times.  No enuresis.  Did physical therapy and did not have a lot of progress.  Wearing a panty shield constantly.  1 caffeine per day.   3 UTIs last summer.   Denies dysuria and hematuria.  No hx of renal stones.  No urologic surgeries.   Constipation. No fecal incontinence.   Decreased libido.  Separated from second husband.   Will be taking care of grand child. 52 year old father lives with patient.  May be moving to a now home and down sizing.   GYNECOLOGIC HISTORY: Patient's last menstrual period was 06/29/2006. Contraception: Postmenopausal Menopausal hormone therapy: Estrace Cream "pea size" to urethra 2 times weekly. Last mammogram: 02-22-14 Density Cat.B/BiRads 2/neg:[facility] Last pap smear: 10-05-14 wnl:neg HR HPV        OB History    Gravida Para Term Preterm AB TAB SAB Ectopic Multiple Living   4 3                 Patient Active Problem List   Diagnosis Date Noted  . Chest pain 06/26/2011  . Degenerative disc disease   . THYROID DISORDER 05/10/2010  .  HYPERTENSION 05/10/2010  . ARTHRITIS 05/10/2010  . HYPERLIPIDEMIA 05/04/2010  . ANXIETY 05/04/2010  . DEPRESSION 05/04/2010  . CORONARY ARTERY DISEASE 05/04/2010  . HEMORRHOIDS 05/04/2010  . HEADACHE, CHRONIC 05/04/2010    Past Medical History  Diagnosis Date  . Hypothyroidism   . Hyperlipemia   . Hypertension   . Anxiety   . Degenerative disc disease   . CAD (coronary artery disease) 2005    s/p PCI LAD and cutting balloon angioplasty  with stent reexpansion 04/2008  . Angina pectoris 2005    with stent placement Anterior decscending  . Positive PPD     neg. CXR, treated with INH for 1 yr.  about 25  . MI (myocardial infarction) 2005    angio with stent  . Hypercholesterolemia   . Bone spur     Right Hip    Past Surgical History  Procedure Laterality Date  . Coronary angioplasty  2009  . Nerve root block  2007 & 2013    epidural steroids  . Coronary angioplasty with stent placement  2005  . Tubal ligation    . Tonsillectomy    . Laparoscopy      for endometriosis  . Ruptured disc  2007    with epidural   . Left heart catheterization with coronary angiogram Bilateral 06/27/2011  Procedure: LEFT HEART CATHETERIZATION WITH CORONARY ANGIOGRAM;  Surgeon: Burnell Blanks, MD;  Location: [facility] CATH LAB;  Service: Cardiovascular;  Laterality: Bilateral;  . Tongue biopsy      keratin accumulation - Benign     Current Outpatient Prescriptions  Medication Sig Dispense Refill  . ALPRAZolam (XANAX) 0.5 MG tablet TAKE 1-3 TABLETS BY MOUTH DAILY AS DIRECTED 30 tablet 0  . aspirin 81 MG tablet Take 81 mg by mouth daily.      . Calcium Carbonate-Vitamin D (CALTRATE 600+D) 600-400 MG-UNIT per tablet Take 1 tablet by mouth daily as needed.     . clopidogrel (PLAVIX) 75 MG tablet TAKE 1 TABLET BY MOUTH DAILY 90 tablet 3  . CRESTOR 20 MG tablet TAKE 1 TABLET BY MOUTH ONCE DAILY 90 tablet 3  . cyclobenzaprine (FLEXERIL) 10 MG tablet Take 10 mg by mouth as needed.    .  diclofenac sodium (VOLTAREN) 1 % GEL Apply 1 g topically as needed.     . docusate sodium (COLACE) 100 MG capsule Take 100 mg by mouth as needed.      Marland Kitchen escitalopram (LEXAPRO) 10 MG tablet Take 10 mg by mouth daily.    Marland Kitchen estradiol (ESTRACE) 0.1 MG/GM vaginal cream Pea size amount to urethra 3 times a week at HS 42.5 g 3  . fesoterodine (TOVIAZ) 4 MG TB24 tablet Take 4 mg by mouth daily.    . fluticasone (FLONASE) 50 MCG/ACT nasal spray Place 2 sprays into the nose daily. (Patient taking differently: Place 2 sprays into the nose daily as needed. ) 1 g 2  . glucosamine-chondroitin 500-400 MG tablet Take 1 tablet by mouth 3 (three) times daily.    . Hydrocodone-Acetaminophen (VICODIN PO) Take by mouth as needed.    Marland Kitchen ibuprofen (ADVIL,MOTRIN) 200 MG tablet Take 600 mg by mouth every 6 (six) hours as needed. For pain    . irbesartan (AVAPRO) 150 MG tablet TAKE 1 TABLET BY MOUTH DAILY 90 tablet 3  . levothyroxine (SYNTHROID, LEVOTHROID) 125 MCG tablet Take 125 mcg by mouth daily. 1 TABLET DAILY    . magnesium 30 MG tablet Take 30 mg by mouth 2 (two) times daily.    . metoprolol (LOPRESSOR) 50 MG tablet TAKE 1/2 TABLET BY MOUTH TWICE DAILY 90 tablet 2  . nitroGLYCERIN (NITROSTAT) 0.4 MG SL tablet Place 1 tablet (0.4 mg total) under the tongue every 5 (five) minutes as needed. For chest pain 25 tablet 0  . omega-3 acid ethyl esters (LOVAZA) 1 G capsule Take 2 g by mouth 2 (two) times daily.      Marland Kitchen oxymetazoline (AFRIN) 0.05 % nasal spray Place 2 sprays into the nose 2 (two) times daily as needed.     . polyethylene glycol (MIRALAX / GLYCOLAX) packet Take 17 g by mouth daily as needed. For constipation     No current facility-administered medications for this visit.     ALLERGIES: Latex; Lisinopril; Tramadol hcl; and Ultram  Family History  Problem Relation Age of Onset  . Colon cancer Mother 66  . Stroke Mother   . Hypertension Mother   . Diabetes Mother   . Rheum arthritis Mother   . Heart  attack Father     x2  . Diabetes Father   . Kidney disease Father   . Stroke Maternal Grandmother   . Esophageal cancer Cousin 61    History   Social History  . Marital Status: Married    Spouse Name: N/A  .  Number of Children: N/A  . Years of Education: N/A   Occupational History  . Not on file.   Social History Main Topics  . Smoking status: Never Smoker   . Smokeless tobacco: Never Used  . Alcohol Use: 0.6 oz/week    1 Glasses of wine per week     Comment: occ wine  . Drug Use: No  . Sexual Activity: Yes    Birth Control/ Protection: Post-menopausal     Comment: BTL 07/03/89   Other Topics Concern  . Not on file   Social History Narrative    ROS:  Pertinent items are noted in HPI.  PHYSICAL EXAMINATION:    BP 122/76 mmHg  Pulse 64  Ht 5\' 5"  (1.651 m)  Wt 159 lb 9.6 oz (72.394 kg)  BMI 26.56 kg/m2  LMP 06/29/2006    General appearance: alert, cooperative and appears stated age   Abdomen: soft, non-tender; bowel sounds normal; no masses,  no organomegaly    Pelvic: External genitalia:  no lesions              Urethra:  normal appearing urethra with no masses, tenderness or lesions              Bartholins and Skenes: normal                 Vagina: normal appearing vagina with normal color and discharge, no lesions.  First degree cystocele and rectocele.  Good uterine support.              Cervix: no lesions            Bimanual Exam:  Uterus:  normal size, contour, position, consistency, mobility, non-tender              Adnexa: normal adnexa and no mass, fullness, tenderness              Rectovaginal: Yes.  .  Confirms.              Anus:  normal sphincter tone, no lesions  Chaperone was present for exam.  ASSESSMENT  Incomplete uterovaginal prolapse. Overactive bladder.  ON Toviaz. Mild GSI. Urinary retention.  I suspect incomplete voiding due to prolapse but I do not have her urodynamic report. Cardiac patient.   PLAN  Counseled regarding  prolapse and incontinence.  ACOG handout on prolapse. I encouraged her to continue with Toviaz.  I would consider pessary as the next step.  I invited her to schedule with Edman Circle or me. I do not encourage surgery at this time.    An After Visit Summary was printed and given to the patient.  __25____ minutes face to face time of which over 50% was spent in counseling.

## 2014-11-22 ENCOUNTER — Encounter: Payer: Self-pay | Admitting: Cardiovascular Disease

## 2014-11-22 ENCOUNTER — Ambulatory Visit (INDEPENDENT_AMBULATORY_CARE_PROVIDER_SITE_OTHER): Payer: 59 | Admitting: Cardiovascular Disease

## 2014-11-22 VITALS — BP 130/70 | HR 70 | Ht 65.5 in | Wt 161.2 lb

## 2014-11-22 DIAGNOSIS — E785 Hyperlipidemia, unspecified: Secondary | ICD-10-CM | POA: Diagnosis not present

## 2014-11-22 DIAGNOSIS — I2510 Atherosclerotic heart disease of native coronary artery without angina pectoris: Secondary | ICD-10-CM

## 2014-11-22 LAB — LIPID PANEL
CHOL/HDL RATIO: 2
Cholesterol: 183 mg/dL (ref 0–200)
HDL: 76.5 mg/dL (ref >39.00)
LDL Cholesterol: 96 mg/dL (ref 0–99)
NONHDL: 106.5
Triglycerides: 54 mg/dL (ref 0.0–149.0)
VLDL: 10.8 mg/dL (ref 0.0–40.0)

## 2014-11-22 LAB — CBC WITH DIFFERENTIAL/PLATELET
BASOS PCT: 0.4 % (ref 0.0–3.0)
Basophils Absolute: 0 10*3/uL (ref 0.0–0.1)
EOS ABS: 0 10*3/uL (ref 0.0–0.7)
EOS PCT: 0 % (ref 0.0–5.0)
HCT: 38.8 % (ref 36.0–46.0)
Hemoglobin: 12.9 g/dL (ref 12.0–15.0)
LYMPHS ABS: 2.9 10*3/uL (ref 0.7–4.0)
Lymphocytes Relative: 54.2 % — ABNORMAL HIGH (ref 12.0–46.0)
MCHC: 33.3 g/dL (ref 30.0–36.0)
MCV: 87.5 fl (ref 78.0–100.0)
MONOS PCT: 7.4 % (ref 3.0–12.0)
Monocytes Absolute: 0.4 10*3/uL (ref 0.1–1.0)
NEUTROS ABS: 2 10*3/uL (ref 1.4–7.7)
Neutrophils Relative %: 38 % — ABNORMAL LOW (ref 43.0–77.0)
PLATELETS: 269 10*3/uL (ref 150.0–400.0)
RBC: 4.44 Mil/uL (ref 3.87–5.11)
RDW: 16.1 % — AB (ref 11.5–15.5)
WBC: 5.4 10*3/uL (ref 4.0–10.5)

## 2014-11-22 LAB — BASIC METABOLIC PANEL
BUN: 17 mg/dL (ref 6–23)
CHLORIDE: 102 meq/L (ref 96–112)
CO2: 32 mEq/L (ref 19–32)
Calcium: 9.2 mg/dL (ref 8.4–10.5)
Creatinine, Ser: 0.77 mg/dL (ref 0.40–1.20)
GFR: 81.17 mL/min (ref >60.00)
GLUCOSE: 83 mg/dL (ref 70–99)
Potassium: 3.6 mEq/L (ref 3.5–5.1)
Sodium: 140 mEq/L (ref 135–145)

## 2014-11-22 LAB — HEPATIC FUNCTION PANEL
ALT: 18 U/L (ref 0–35)
AST: 22 U/L (ref 0–37)
Albumin: 4.3 g/dL (ref 3.5–5.2)
Alkaline Phosphatase: 55 U/L (ref 39–117)
Bilirubin, Direct: 0.2 mg/dL (ref 0.0–0.3)
Total Bilirubin: 0.7 mg/dL (ref 0.2–1.2)
Total Protein: 6.5 g/dL (ref 6.0–8.3)

## 2014-11-22 MED ORDER — NITROGLYCERIN 0.4 MG SL SUBL: 0.4000 mg | SUBLINGUAL_TABLET | SUBLINGUAL | Status: DC | PRN

## 2014-11-22 NOTE — Patient Instructions (Signed)
Medication Instructions:  STOP Aspirin   Labwork: TODAY - CBC, BMET, cholesterol, liver   Testing/Procedures: None Ordered   Follow-Up: Your physician wants you to follow-up in: 6 months with Dr. Acie Fredrickson.  You will receive a reminder letter in the mail two months in advance. If you don't receive a letter, please call our office to schedule the follow-up appointment.

## 2014-11-22 NOTE — Progress Notes (Signed)
Sarah Ballard Date of Birth  05/08/1955 [facility] [address]    [place], [place]  [address] [phone]  Fax  [phone]  Problem List 1. Coronary artery disease-  2. anxiety 3. Hyperlipidemia 4. Hypertension 5. Hypothyroidism 6. Chronic back pain  History of Present Illness:  The 59 yo  female with a history of coronary artery disease. She's status post PTCA and stenting of her left anterior descending artery. She also had Cutting Balloon procedure and reexpansion of that stent in January 2010.  She was admitted recently to the hospital for chest pain an dizziness and cath revealed no significant irregularities.  He is walking twice a week. She's not having episodes of angina with walking. 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  October 27, 2012:  Sarah Ballard is doing well from a cardiac standpoint.   She is having some back pain - needs to have a back injection but does not want to stop the Plavix.    October 27, 2013:  Sarah Ballard is doing well.  She denies any chest pain.  Bumping into lots of things and has bruising on her legs.   Her potassium was low at her last check.    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  Asked about stopping the plavix   Current Outpatient Prescriptions on File Prior to Visit  Medication Sig Dispense Refill  . ALPRAZolam (XANAX) 0.5 MG tablet TAKE 1-3 TABLETS BY MOUTH DAILY AS DIRECTED 30 tablet 0  . aspirin 81 MG tablet Take 81 mg by mouth daily.      . Calcium Carbonate-Vitamin D (CALTRATE 600+D) 600-400 MG-UNIT per tablet Take 1 tablet by mouth daily as needed.     . clopidogrel (PLAVIX) 75 MG tablet TAKE 1 TABLET BY MOUTH DAILY 90 tablet 3  . CRESTOR 20 MG tablet TAKE 1 TABLET BY MOUTH ONCE DAILY 90 tablet 3  . cyclobenzaprine (FLEXERIL) 10 MG tablet Take 10 mg by mouth as  needed.    . diclofenac sodium (VOLTAREN) 1 % GEL Apply 1 g topically as needed.     . docusate sodium (COLACE) 100 MG capsule Take 100 mg by mouth as needed.      Marland Kitchen escitalopram (LEXAPRO) 10 MG tablet Take 10 mg by mouth daily.    Marland Kitchen estradiol (ESTRACE) 0.1 MG/GM vaginal cream Pea size amount to urethra 3 times a week at HS 42.5 g 3  . fesoterodine (TOVIAZ) 4 MG TB24 tablet Take 4 mg by mouth daily.    . fluticasone (FLONASE) 50 MCG/ACT nasal spray Place 2 sprays into the nose daily. (Patient taking differently: Place 2 sprays into the nose daily as needed. ) 1 g 2  . glucosamine-chondroitin 500-400 MG tablet Take 1 tablet by mouth 3 (three) times daily.    . Hydrocodone-Acetaminophen (VICODIN PO) Take by mouth as needed.    Marland Kitchen ibuprofen (ADVIL,MOTRIN) 200 MG tablet Take 600 mg by mouth every 6 (six) hours as needed. For pain    . irbesartan (AVAPRO) 150 MG tablet TAKE 1 TABLET BY MOUTH DAILY 90 tablet 3  . levothyroxine (SYNTHROID, LEVOTHROID) 125 MCG tablet Take 125 mcg by mouth daily. 1 TABLET DAILY    . magnesium 30 MG tablet Take 30 mg by mouth 2 (two) times daily.    . metoprolol (  LOPRESSOR) 50 MG tablet TAKE 1/2 TABLET BY MOUTH TWICE DAILY 90 tablet 2  . nitroGLYCERIN (NITROSTAT) 0.4 MG SL tablet Place 1 tablet (0.4 mg total) under the tongue every 5 (five) minutes as needed. For chest pain 25 tablet 0  . omega-3 acid ethyl esters (LOVAZA) 1 G capsule Take 2 g by mouth 2 (two) times daily.      Marland Kitchen oxymetazoline (AFRIN) 0.05 % nasal spray Place 2 sprays into the nose 2 (two) times daily as needed.     . polyethylene glycol (MIRALAX / GLYCOLAX) packet Take 17 g by mouth daily as needed. For constipation     No current facility-administered medications on file prior to visit.    Allergies  Allergen Reactions  . Latex     itching  . Lisinopril Cough  . Tramadol Hcl     REACTION: nausea, vomiting  . Ultram [Tramadol]     Nausea and vomitting     Past Medical History  Diagnosis Date   . Hypothyroidism   . Hyperlipemia   . Hypertension   . Anxiety   . Degenerative disc disease   . CAD (coronary artery disease) 2005    s/p PCI LAD and cutting balloon angioplasty  with stent reexpansion 04/2008  . Angina pectoris 2005    with stent placement Anterior decscending  . Positive PPD     neg. CXR, treated with INH for 1 yr.  about 69  . MI (myocardial infarction) 2005    angio with stent  . Hypercholesterolemia   . Bone spur     Right Hip    Past Surgical History  Procedure Laterality Date  . Coronary angioplasty  2009  . Nerve root block  2007 & 2013    epidural steroids  . Coronary angioplasty with stent placement  2005  . Tubal ligation    . Tonsillectomy    . Laparoscopy      for endometriosis  . Ruptured disc  2007    with epidural   . Left heart catheterization with coronary angiogram Bilateral 06/27/2011    Procedure: LEFT HEART CATHETERIZATION WITH CORONARY ANGIOGRAM;  Surgeon: Burnell Blanks, MD;  Location: [facility] CATH LAB;  Service: Cardiovascular;  Laterality: Bilateral;  . Tongue biopsy      keratin accumulation - Benign     History  Smoking status  . Never Smoker   Smokeless tobacco  . Never Used    History  Alcohol Use  . 0.6 oz/week  . 1 Glasses of wine per week    Comment: occ wine    Family History  Problem Relation Age of Onset  . Colon cancer Mother 52  . Stroke Mother   . Hypertension Mother   . Diabetes Mother   . Rheum arthritis Mother   . Heart attack Father     x2  . Diabetes Father   . Kidney disease Father   . Stroke Maternal Grandmother   . Esophageal cancer Cousin 61    Reviw of Systems:  Reviewed in the HPI.  All other systems are negative.  Physical Exam: BP 130/70 mmHg  Pulse 70  Ht 5' 5.5" (1.664 m)  Wt 73.143 kg (161 lb 4 oz)  BMI 26.42 kg/m2  LMP 06/29/2006 The patient is alert and oriented x 3.  The mood and affect are normal.   Skin: warm and dry.  Color is normal.    HEENT:   the sclera  are nonicteric.  The mucous membranes are  moist.  The carotids are 2+ without bruits.  There is no thyromegaly.  There is no JVD.    Lungs: clear.  The chest wall is non tender.    Heart: regular rate with a normal S1 and S2.  There are no murmurs, gallops, or rubs. The PMI is not displaced.     Abdomen: good bowel sounds.  There is no guarding or rebound.  There is no hepatosplenomegaly or tenderness.  There are no masses.   Extremities:  no clubbing, cyanosis, or edema.  The legs are without rashes.    Her right radial cath site is well-healed. There is a small scar associated with her site. Her pulses are intact.  Neuro:  Cranial nerves II - XII are intact.  Motor and sensory functions are intact.    The gait is normal.  ECG :  - Normal sinus rhythm at 70 per chest no ST or T wave changes.  Assessment / Plan:   1. Coronary artery disease-  she's doing very well. She's having some bruising. I would like to continue the Plavix if possible. Will discontinue the aspirin to see if that helps. I'll see her in 6 months. We will check fasting labs today including lipid profile, basic metabolic profile, liver enzymes. I would also like to check a CBC to make sure that her platelet count is normal given the amount of bruising that she's having. 2. anxiety 3. Hyperlipidemia -  we'll check fasting labs today. 4. Hypertension  - blood pressures okay. 5. Hypothyroidism 6. Chronic back pain    Nahser, Wonda Cheng, MD  11/22/2014 10:48 AM    [facility] [place],  [place], [place]  [address] Pager [phone] Phone: [phone]; Fax: [phone]   [facility]  [address] [place],   [address] [phone]   Fax [phone]

## 2014-11-23 ENCOUNTER — Telehealth: Payer: Self-pay | Admitting: Cardiovascular Disease

## 2014-11-23 NOTE — Telephone Encounter (Signed)
New message      Calling to tell Sharyn Lull "thank you for returning her call".

## 2014-12-02 ENCOUNTER — Other Ambulatory Visit: Payer: Self-pay | Admitting: Cardiovascular Disease

## 2014-12-06 ENCOUNTER — Other Ambulatory Visit: Payer: Self-pay | Admitting: *Deleted

## 2014-12-06 MED ORDER — IRBESARTAN 150 MG PO TABS: 150.0000 mg | ORAL_TABLET | Freq: Every day | ORAL | Status: DC

## 2014-12-23 ENCOUNTER — Emergency Department (HOSPITAL_COMMUNITY): Payer: PRIVATE HEALTH INSURANCE

## 2014-12-23 ENCOUNTER — Emergency Department (HOSPITAL_COMMUNITY)
Admission: EM | Admit: 2014-12-23 | Discharge: 2014-12-23 | Disposition: A | Discharge status: Home / Self Care | Payer: PRIVATE HEALTH INSURANCE | Attending: Emergency Medicine | Admitting: Emergency Medicine

## 2014-12-23 ENCOUNTER — Encounter (HOSPITAL_COMMUNITY): Payer: Self-pay | Admitting: *Deleted

## 2014-12-23 DIAGNOSIS — S0990XA Unspecified injury of head, initial encounter: Secondary | ICD-10-CM | POA: Diagnosis not present

## 2014-12-23 DIAGNOSIS — Y9289 Other specified places as the place of occurrence of the external cause: Secondary | ICD-10-CM | POA: Diagnosis not present

## 2014-12-23 DIAGNOSIS — Z7902 Long term (current) use of antithrombotics/antiplatelets: Secondary | ICD-10-CM | POA: Diagnosis not present

## 2014-12-23 DIAGNOSIS — I10 Essential (primary) hypertension: Secondary | ICD-10-CM | POA: Diagnosis not present

## 2014-12-23 DIAGNOSIS — E039 Hypothyroidism, unspecified: Secondary | ICD-10-CM | POA: Diagnosis not present

## 2014-12-23 DIAGNOSIS — Y998 Other external cause status: Secondary | ICD-10-CM | POA: Insufficient documentation

## 2014-12-23 DIAGNOSIS — T07XXXA Unspecified multiple injuries, initial encounter: Secondary | ICD-10-CM

## 2014-12-23 DIAGNOSIS — I252 Old myocardial infarction: Secondary | ICD-10-CM | POA: Insufficient documentation

## 2014-12-23 DIAGNOSIS — E785 Hyperlipidemia, unspecified: Secondary | ICD-10-CM | POA: Diagnosis not present

## 2014-12-23 DIAGNOSIS — Z91040 Latex allergy status: Secondary | ICD-10-CM | POA: Diagnosis not present

## 2014-12-23 DIAGNOSIS — I25119 Atherosclerotic heart disease of native coronary artery with unspecified angina pectoris: Secondary | ICD-10-CM | POA: Diagnosis not present

## 2014-12-23 DIAGNOSIS — Z79899 Other long term (current) drug therapy: Secondary | ICD-10-CM | POA: Diagnosis not present

## 2014-12-23 DIAGNOSIS — Y9389 Activity, other specified: Secondary | ICD-10-CM | POA: Diagnosis not present

## 2014-12-23 DIAGNOSIS — F419 Anxiety disorder, unspecified: Secondary | ICD-10-CM | POA: Diagnosis not present

## 2014-12-23 DIAGNOSIS — W07XXXA Fall from chair, initial encounter: Secondary | ICD-10-CM | POA: Diagnosis not present

## 2014-12-23 MED ORDER — HYDROCODONE-ACETAMINOPHEN 5-325 MG PO TABS
1.0000 | ORAL_TABLET | Freq: Once | ORAL | Status: AC
  Administered 2014-12-23: 1 via ORAL
  Filled 2014-12-23: qty 1

## 2014-12-23 NOTE — ED Notes (Signed)
Pt was able to ambulate to the restroom with stand by assistance. Pt remains monitored by blood pressure and pulse ox.

## 2014-12-23 NOTE — Discharge Instructions (Signed)
Please read and follow all provided instructions.  Your diagnoses today include:  1. Contusion of multiple sites   2. Minor head injury, initial encounter    Tests performed today include:  CT scan of your head that did not show any serious injury.  X-rays of bruised areas - no broken bones  Vital signs. See below for your results today.   Medications prescribed:   None  Take any prescribed medications only as directed.  Home care instructions:  Follow any educational materials contained in this packet.  BE VERY CAREFUL not to take multiple medicines containing Tylenol (also called acetaminophen). Doing so can lead to an overdose which can damage your liver and cause liver failure and possibly death.   Follow-up instructions: Please follow-up with your primary care provider in the next 5 days for further evaluation of your symptoms if not feeling better.   Return instructions:  SEEK IMMEDIATE MEDICAL ATTENTION IF:  There is confusion or drowsiness (although children frequently become drowsy after injury).   You cannot awaken the injured person.   You have more than one episode of vomiting.   You notice dizziness or unsteadiness which is getting worse, or inability to walk.   You have convulsions or unconsciousness.   You experience severe, persistent headaches not relieved by Tylenol.  You cannot use arms or legs normally.   There are changes in pupil sizes. (This is the black center in the colored part of the eye)   There is clear or bloody discharge from the nose or ears.   You have change in speech, vision, swallowing, or understanding.   Localized weakness, numbness, tingling, or change in bowel or bladder control.  You have any other emergent concerns.  Additional Information: You have had a head injury which does not appear to require admission at this time.  Your vital signs today were: BP 128/77 mmHg   Pulse 67   Temp(Src) 97.8 F (36.6 C) (Oral)    Resp 18   Ht 5\' 5"  (1.651 m)   Wt 154 lb (69.854 kg)   BMI 25.63 kg/m2   SpO2 98%   LMP 06/29/2006 If your blood pressure (BP) was elevated above 135/85 this visit, please have this repeated by your doctor within one month. --------------

## 2014-12-23 NOTE — ED Notes (Signed)
Pt tripped over chair at work and fell on her right side to the floor. Pt states No LOC.  Pt states her right side, hip and wrist hurt 9/10.  Pt states she hit her right head on lockers and has a small bump on head.  Pt appears anxious.  Pt states she is on plavix.

## 2014-12-23 NOTE — ED Notes (Signed)
Pt off unit with xray 

## 2014-12-23 NOTE — Progress Notes (Signed)
Orthopedic Tech Progress Note Patient Details:  Sarah Ballard [birth year] [id number]  Ortho Devices Type of Ortho Device: Velcro wrist splint Ortho Device/Splint Location: rue Ortho Device/Splint Interventions: Application   Hildred Priest 12/23/2014, 12:49 PM

## 2014-12-23 NOTE — ED Notes (Signed)
Pt appears anxious, very talkative and apologetic.

## 2014-12-23 NOTE — ED Provider Notes (Signed)
CSN: [id number]     Arrival date & time 12/23/14  0942 History   First MD Initiated Contact with Patient 12/23/14 0957     Chief Complaint  Patient presents with  . Fall     (Consider location/radiation/quality/duration/timing/severity/associated sxs/prior Treatment) HPI Comments: Patient with history of CAD on Plavix -- presents to the emergency department after a mechanical fall. Patient states that she tripped over a chair and fell onto her right side. She laid on the floor for approximately 6 minutes until coworkers found her and helped her up. Patient complains currently of right knee, right hip, right ribs, right wrist pain. She did hit her head but did not lose consciousness. She has a small bump on the right forehead. No subsequent blurred vision, nausea or vomiting. No weakness in her arms or legs. No chest pain or shortness of breath. No abdominal pain, blood in urine, blood in stool. The onset of this condition was acute. The course is constant. Aggravating factors: movement. Alleviating factors: none.    The history is provided by the patient.    Past Medical History  Diagnosis Date  . Hypothyroidism   . Hyperlipemia   . Hypertension   . Anxiety   . Degenerative disc disease   . CAD (coronary artery disease) 2005    s/p PCI LAD and cutting balloon angioplasty  with stent reexpansion 04/2008  . Angina pectoris 2005    with stent placement Anterior decscending  . Positive PPD     neg. CXR, treated with INH for 1 yr.  about 38  . MI (myocardial infarction) 2005    angio with stent  . Hypercholesterolemia   . Bone spur     Right Hip   Past Surgical History  Procedure Laterality Date  . Coronary angioplasty  2009  . Nerve root block  2007 & 2013    epidural steroids  . Coronary angioplasty with stent placement  2005  . Tubal ligation    . Tonsillectomy    . Laparoscopy      for endometriosis  . Ruptured disc  2007    with epidural   . Left heart catheterization  with coronary angiogram Bilateral 06/27/2011    Procedure: LEFT HEART CATHETERIZATION WITH CORONARY ANGIOGRAM;  Surgeon: Burnell Blanks, MD;  Location: [facility] CATH LAB;  Service: Cardiovascular;  Laterality: Bilateral;  . Tongue biopsy      keratin accumulation - Benign    Family History  Problem Relation Age of Onset  . Colon cancer Mother 62  . Stroke Mother   . Hypertension Mother   . Diabetes Mother   . Rheum arthritis Mother   . Heart attack Father     x2  . Diabetes Father   . Kidney disease Father   . Stroke Maternal Grandmother   . Esophageal cancer Cousin 61   Social History  Substance Use Topics  . Smoking status: Never Smoker   . Smokeless tobacco: Never Used  . Alcohol Use: 0.6 oz/week    1 Glasses of wine per week     Comment: occ wine   OB History    Gravida Para Term Preterm AB TAB SAB Ectopic Multiple Living   4 3             Review of Systems  Constitutional: Negative for fever and fatigue.  HENT: Negative for rhinorrhea, sore throat and tinnitus.   Eyes: Negative for photophobia, pain, redness and visual disturbance.  Respiratory: Negative for cough and  shortness of breath.   Cardiovascular: Negative for chest pain.  Gastrointestinal: Negative for nausea, vomiting, abdominal pain and diarrhea.  Genitourinary: Negative for dysuria.  Musculoskeletal: Positive for myalgias and arthralgias. Negative for back pain, gait problem and neck pain.  Skin: Negative for rash and wound.  Neurological: Negative for dizziness, syncope, weakness, light-headedness, numbness and headaches.  Psychiatric/Behavioral: Negative for confusion and decreased concentration.      Allergies  Latex; Lisinopril; Tramadol hcl; and Ultram  Home Medications   Prior to Admission medications   Medication Sig Start Date End Date Taking? Authorizing Provider  ALPRAZolam Duanne Moron) 0.5 MG tablet TAKE 1-3 TABLETS BY MOUTH DAILY AS DIRECTED 09/25/11   Thayer Headings, MD  Calcium  Carbonate-Vitamin D (CALTRATE 600+D) 600-400 MG-UNIT per tablet Take 1 tablet by mouth daily as needed.     Historical Provider, MD  clopidogrel (PLAVIX) 75 MG tablet TAKE 1 TABLET BY MOUTH DAILY 12/02/14   Thayer Headings, MD  CRESTOR 20 MG tablet TAKE 1 TABLET BY MOUTH ONCE DAILY 08/04/14   Thayer Headings, MD  cyclobenzaprine (FLEXERIL) 10 MG tablet Take 10 mg by mouth as needed.    Historical Provider, MD  diclofenac sodium (VOLTAREN) 1 % GEL Apply 1 g topically as needed.     Historical Provider, MD  docusate sodium (COLACE) 100 MG capsule Take 100 mg by mouth as needed.      Historical Provider, MD  escitalopram (LEXAPRO) 10 MG tablet Take 10 mg by mouth daily.    Historical Provider, MD  estradiol (ESTRACE) 0.1 MG/GM vaginal cream Pea size amount to urethra 3 times a week at [facility] 10/05/14   Kem Boroughs, FNP  fesoterodine (TOVIAZ) 4 MG TB24 tablet Take 4 mg by mouth daily.    Historical Provider, MD  fluticasone (FLONASE) 50 MCG/ACT nasal spray Place 2 sprays into the nose daily. Patient taking differently: Place 2 sprays into the nose daily as needed.  10/30/12   Liam Graham, PA-C  glucosamine-chondroitin 500-400 MG tablet Take 1 tablet by mouth 3 (three) times daily.    Historical Provider, MD  Hydrocodone-Acetaminophen (VICODIN PO) Take by mouth as needed.    Historical Provider, MD  ibuprofen (ADVIL,MOTRIN) 200 MG tablet Take 600 mg by mouth every 6 (six) hours as needed. For pain    Historical Provider, MD  irbesartan (AVAPRO) 150 MG tablet Take 1 tablet (150 mg total) by mouth daily. 12/06/14   Thayer Headings, MD  levothyroxine (SYNTHROID, LEVOTHROID) 125 MCG tablet Take 125 mcg by mouth daily. 1 TABLET DAILY    Historical Provider, MD  magnesium 30 MG tablet Take 30 mg by mouth 2 (two) times daily.    Historical Provider, MD  metoprolol (LOPRESSOR) 50 MG tablet TAKE 1/2 TABLET BY MOUTH TWICE DAILY 10/18/14   Thayer Headings, MD  nitroGLYCERIN (NITROSTAT) 0.4 MG SL tablet Place 1 tablet (0.4  mg total) under the tongue every 5 (five) minutes as needed. For chest pain 11/22/14   Thayer Headings, MD  omega-3 acid ethyl esters (LOVAZA) 1 G capsule Take 2 g by mouth 2 (two) times daily.      Historical Provider, MD  oxymetazoline (AFRIN) 0.05 % nasal spray Place 2 sprays into the nose 2 (two) times daily as needed.     Historical Provider, MD  polyethylene glycol (MIRALAX / GLYCOLAX) packet Take 17 g by mouth daily as needed. For constipation    Historical Provider, MD   BP 143/84 mmHg  Pulse  61  Temp(Src) 97.8 F (36.6 C) (Oral)  Resp 18  Ht 5\' 5"  (1.651 m)  Wt 154 lb (69.854 kg)  BMI 25.63 kg/m2  SpO2 100%  LMP 06/29/2006 Physical Exam  Constitutional: She is oriented to person, place, and time. She appears well-developed and well-nourished.  HENT:  Head: Normocephalic. Head is without raccoon's eyes and without Battle's sign.  Right Ear: Tympanic membrane, external ear and ear canal normal. No hemotympanum.  Left Ear: Tympanic membrane, external ear and ear canal normal. No hemotympanum.  Nose: Nose normal. No nasal septal hematoma.  Mouth/Throat: Uvula is midline, oropharynx is clear and moist and mucous membranes are normal.  Small 1-2 cm hematoma right forehead. Minimally tender. No depression.  Eyes: Conjunctivae, EOM and lids are normal. Pupils are equal, round, and reactive to light. Right eye exhibits no nystagmus. Left eye exhibits no nystagmus.  No visible hyphema noted  Neck: Normal range of motion. Neck supple.  Cardiovascular: Normal rate and regular rhythm.   No murmur heard. Pulmonary/Chest: Effort normal and breath sounds normal. No respiratory distress. She has no wheezes. She has no rales. She exhibits tenderness (Right lateral inferior ribs. No step-offs or deformities.).  Abdominal: Soft. There is no tenderness.  Musculoskeletal: She exhibits tenderness.       Right shoulder: She exhibits normal range of motion, no tenderness and no bony tenderness.        Left shoulder: She exhibits normal range of motion, no tenderness and no bony tenderness.       Right elbow: She exhibits normal range of motion and no swelling. Tenderness found.       Left elbow: Normal.       Right wrist: She exhibits decreased range of motion, tenderness and bony tenderness. She exhibits no swelling.       Left wrist: Normal.       Right hip: She exhibits tenderness. She exhibits normal range of motion and normal strength.       Left hip: Normal.       Right knee: She exhibits swelling. She exhibits normal range of motion. Tenderness found. Lateral joint line tenderness noted.       Left knee: Normal.       Right ankle: Normal.       Left ankle: Normal.       Cervical back: She exhibits normal range of motion, no tenderness and no bony tenderness.       Thoracic back: She exhibits no tenderness and no bony tenderness.       Lumbar back: She exhibits no tenderness and no bony tenderness.       Right upper arm: Normal.       Left upper arm: Normal.       Right forearm: Normal.       Left forearm: Normal.       Right hand: She exhibits decreased range of motion and tenderness (Proximally). Normal sensation noted. Normal strength noted.       Left hand: Normal.       Right upper leg: Normal.       Left upper leg: Normal.       Right lower leg: Normal.       Left lower leg: Normal.       Right foot: Normal.       Left foot: Normal.  Neurological: She is alert and oriented to person, place, and time. She has normal strength and normal reflexes. No cranial nerve deficit or  sensory deficit. Coordination normal. GCS eye subscore is 4. GCS verbal subscore is 5. GCS motor subscore is 6.  Skin: Skin is warm and dry.  Psychiatric: She has a normal mood and affect.  Nursing note and vitals reviewed.   ED Course  Procedures (including critical care time) Labs Review Labs Reviewed - No data to display  Imaging Review Dg Chest 2 View  12/23/2014   CLINICAL DATA:  Right  chest pain after falling over a chair at work and landing on her right side.  EXAM: CHEST  2 VIEW  COMPARISON:  10/30/2012.  FINDINGS: Normal sized heart. Clear lungs. No fracture or pneumothorax. Mild thoracic spine degenerative changes.  IMPRESSION: No acute abnormality.   Electronically Signed   By: Claudie Revering M.D.   On: 12/23/2014 12:00   Dg Wrist Complete Right  12/23/2014   CLINICAL DATA:  Fall onto right wrist with acute right wrist pain. Initial encounter.  EXAM: RIGHT WRIST - COMPLETE 3+ VIEW  COMPARISON:  04/26/2009  FINDINGS: There is no evidence of fracture or dislocation. There is no evidence of arthropathy or other focal bone abnormality. Soft tissues are unremarkable.  IMPRESSION: Negative.   Electronically Signed   By: Margarette Canada M.D.   On: 12/23/2014 12:00   Ct Head Wo Contrast  12/23/2014   CLINICAL DATA:  Patient status post fall. No reported loss of consciousness. Struck the right side of her head.  EXAM: CT HEAD WITHOUT CONTRAST  TECHNIQUE: Contiguous axial images were obtained from the base of the skull through the vertex without intravenous contrast.  COMPARISON:  None.  FINDINGS: Ventricles and sulci are appropriate for patient's age. No evidence for acute cortically based infarct, intracranial hemorrhage, mass lesion or mass-effect. Paranasal sinuses are unremarkable. Mastoid air cells are well aerated. Calvarium is intact.  IMPRESSION: No acute intracranial process.   Electronically Signed   By: Lovey Newcomer M.D.   On: 12/23/2014 12:16   Dg Knee Complete 4 Views Right  12/23/2014   CLINICAL DATA:  Right knee pain status post fall. Patient is on Plavix.  EXAM: RIGHT KNEE - COMPLETE 4+ VIEW  COMPARISON:  None.  FINDINGS: No evidence of acute fracture or subluxation. No focal bone lesion or bone destruction. Bone cortex and trabecular architecture appear intact. There are minimal osteoarthritic changes of the medial and lateral compartments of the knee joint. There is a small  suprapatellar joint effusion. No radiopaque soft tissue foreign bodies.  IMPRESSION: No evidence of fracture.  Minimal osteoarthritic changes of the medial and lateral compartment of the knee joint.  Tiny suprapatellar joint effusion.   Electronically Signed   By: Fidela Salisbury M.D.   On: 12/23/2014 12:02   Dg Hip Unilat With Pelvis 2-3 Views Right  12/23/2014   CLINICAL DATA:  Right hip pain after falling over a chair at work and landing on her right side.  EXAM: DG HIP (WITH OR WITHOUT PELVIS) 2-3V RIGHT  COMPARISON:  None.  FINDINGS: Normal appearing right hip without fracture or dislocation. Lower lumbar spine degenerative changes, greater on the right. Minimal left hip degenerative change.  IMPRESSION: 1. No fracture or dislocation. 2. Minimal left hip degenerative changes. 3. Lower lumbar spine degenerative changes.   Electronically Signed   By: Claudie Revering M.D.   On: 12/23/2014 12:01   I have personally reviewed and evaluated these images and lab results as part of my medical decision-making.   EKG Interpretation None  10:37 AM Patient seen and examined. Imaging pending.   Vital signs reviewed and are as follows: BP 143/84 mmHg  Pulse 61  Temp(Src) 97.8 F (36.6 C) (Oral)  Resp 18  Ht 5\' 5"  (1.651 m)  Wt 154 lb (69.854 kg)  BMI 25.63 kg/m2  SpO2 100%  LMP 06/29/2006  1:16 PM Patient updated on imaging results. Patient has ambulated to the restroom well.   She has pain medication at home. She was given a wrist splint. Counseled on rice protocol. Daughter now at bedside. Questions answered and daughter voices no additional concerns. Patient has good support and monitoring at home.  She has vicodin to use as needed. She wants to get a back injection tomorrow as planned.   MDM   Final diagnoses:  Contusion of multiple sites  Minor head injury, initial encounter   Patient with multiple contused areas, imaging neg. She did hit her head, although mild, and is on  Plavix since CT performed which was negative. Patient is reassured. Will discharge to home with plan as above.   Carlisle Cater, PA-C 12/23/14 1319  Gareth Morgan, MD 12/23/14 [phone]

## 2015-03-04 ENCOUNTER — Telehealth: Payer: Self-pay | Admitting: Nurse Practitioner

## 2015-03-04 ENCOUNTER — Other Ambulatory Visit: Payer: Self-pay | Admitting: Nurse Practitioner

## 2015-03-04 MED ORDER — FLUCONAZOLE 150 MG PO TABS: 150.0000 mg | ORAL_TABLET | Freq: Once | ORAL | Status: DC

## 2015-03-04 NOTE — Telephone Encounter (Signed)
OK for Diflucan 150 mg # 2/1 refill.  Order is sent to her pharmacy.

## 2015-03-04 NOTE — Telephone Encounter (Signed)
Spoke with patient. Patient states that she has been experiencing vaginal itching, irritation, and redness since last Sunday 10/30. 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 Has OAB and has to wear a panty liner daily. 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 We are in there for so long sometimes that I am unable to change my panty liner and then I get irritated. I have to work all weekend starting today and I am working extra days next week since we are short staffed." Requesting an Rx for Diflucan be sent to the pharmacy for her. Advised I will speak with Kem Boroughs, FNP and return call with further recommendations. Patient is agreeable.  Kem Boroughs, FNP okay for Diflucan 150 #2 0RF?

## 2015-03-04 NOTE — Telephone Encounter (Signed)
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 Patient wants to know if she can have diflucan called in. She is working 12 hour shifts this weekend.  Best # to reach [phone]

## 2015-03-04 NOTE — Telephone Encounter (Signed)
Spoke with patient. Advised of message as seen below from Kem Boroughs, [place]. Patient is agreeable and verbalizes understanding. Patient is very grateful.  Routing to provider for final review. Patient agreeable to disposition. Will close encounter.

## 2015-03-29 ENCOUNTER — Encounter: Payer: Self-pay | Admitting: Gastroenterology

## 2015-05-06 ENCOUNTER — Other Ambulatory Visit (HOSPITAL_COMMUNITY): Payer: Self-pay | Admitting: Orthopedic Surgery

## 2015-05-06 DIAGNOSIS — M1831 Unilateral post-traumatic osteoarthritis of first carpometacarpal joint, right hand: Secondary | ICD-10-CM

## 2015-05-17 MED FILL — ALPRAZolam 0.5 MG TABS: 0.5 | 20 days supply | Qty: 60 | Fill #3

## 2015-05-17 MED FILL — FLUCONAZOLE 150 MG TABLET: 150 | 2 days supply | Qty: 2 | Fill #1

## 2015-05-18 DIAGNOSIS — H52223 Regular astigmatism, bilateral: Secondary | ICD-10-CM | POA: Diagnosis not present

## 2015-05-18 DIAGNOSIS — H524 Presbyopia: Secondary | ICD-10-CM | POA: Diagnosis not present

## 2015-05-18 DIAGNOSIS — H5213 Myopia, bilateral: Secondary | ICD-10-CM | POA: Diagnosis not present

## 2015-05-24 ENCOUNTER — Encounter: Payer: Self-pay | Admitting: Cardiovascular Disease

## 2015-05-24 ENCOUNTER — Ambulatory Visit (INDEPENDENT_AMBULATORY_CARE_PROVIDER_SITE_OTHER): Payer: 59 | Admitting: Cardiovascular Disease

## 2015-05-24 VITALS — BP 104/80 | HR 80 | Ht 65.0 in | Wt 157.8 lb

## 2015-05-24 DIAGNOSIS — I2510 Atherosclerotic heart disease of native coronary artery without angina pectoris: Secondary | ICD-10-CM | POA: Diagnosis not present

## 2015-05-24 DIAGNOSIS — E785 Hyperlipidemia, unspecified: Secondary | ICD-10-CM | POA: Diagnosis not present

## 2015-05-24 NOTE — Progress Notes (Signed)
Sarah Ballard Date of Birth  Jun 03, 1955 [facility] [address]    [place], [place]  [address] [phone]  Fax  [phone]  Problem List 1. Coronary artery disease-  2. anxiety 3. Hyperlipidemia 4. Hypertension 5. Hypothyroidism 6. Chronic back pain  History of Present Illness:  The 60 yo  female with a history of coronary artery disease. She's status post PTCA and stenting of her left anterior descending artery. She also had Cutting Balloon procedure and reexpansion of that stent in January 2010.  She was admitted recently to the hospital for chest pain an dizziness and cath revealed no significant irregularities.  He is walking twice a week. She's not having episodes of angina with walking. 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  October 27, 2012:  Sarah Ballard is doing well from a cardiac standpoint.   She is having some back pain - needs to have a back injection but does not want to stop the Plavix.    October 27, 2013:  Sarah Ballard is doing well.  She denies any chest pain.  Bumping into lots of things and has bruising on her legs.   Her potassium was low at her last check.    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  Asked about stopping the plavix   Jan. 24, 2017:  Sarah Ballard is doing ok Has been losing weight.   Lots of stress with taking care of her father and her divorce and her son .  No CP .      Current Outpatient Prescriptions on File Prior to Visit  Medication Sig Dispense Refill  . ALPRAZolam (XANAX) 0.5 MG tablet TAKE 1-3 TABLETS BY MOUTH DAILY AS DIRECTED 30 tablet 0  . Calcium Carbonate-Vitamin D (CALTRATE 600+D) 600-400 MG-UNIT per tablet Take 1 tablet by mouth daily as needed.     . clopidogrel (PLAVIX) 75 MG tablet TAKE 1 TABLET BY MOUTH DAILY 90 tablet 3  . CRESTOR 20 MG tablet TAKE 1 TABLET BY  MOUTH ONCE DAILY 90 tablet 3  . cyclobenzaprine (FLEXERIL) 10 MG tablet Take 10 mg by mouth as needed.    . docusate sodium (COLACE) 100 MG capsule Take 100 mg by mouth as needed.      Marland Kitchen escitalopram (LEXAPRO) 10 MG tablet Take 10 mg by mouth daily.    Marland Kitchen estradiol (ESTRACE) 0.1 MG/GM vaginal cream Pea size amount to urethra 3 times a week at [facility] 42.5 g 3  . fluticasone (FLONASE) 50 MCG/ACT nasal spray Place 2 sprays into the nose daily. (Patient taking differently: Place 2 sprays into the nose daily as needed. ) 1 g 2  . glucosamine-chondroitin 500-400 MG tablet Take 1 tablet by mouth 3 (three) times daily.    . Hydrocodone-Acetaminophen (VICODIN PO) Take by mouth as needed.    Marland Kitchen ibuprofen (ADVIL,MOTRIN) 200 MG tablet Take 600 mg by mouth every 6 (six) hours as needed. For pain    . irbesartan (AVAPRO) 150 MG tablet Take 1 tablet (150 mg total) by mouth daily. 90 tablet 3  . levothyroxine (SYNTHROID, LEVOTHROID) 125 MCG tablet Take 125 mcg by mouth daily. 1 TABLET DAILY    . magnesium 30 MG tablet Take 30 mg by mouth 2 (two) times daily.    . metoprolol (LOPRESSOR) 50 MG tablet TAKE 1/2 TABLET  BY MOUTH TWICE DAILY 90 tablet 2  . nitroGLYCERIN (NITROSTAT) 0.4 MG SL tablet Place 1 tablet (0.4 mg total) under the tongue every 5 (five) minutes as needed. For chest pain 25 tablet 6  . omega-3 acid ethyl esters (LOVAZA) 1 G capsule Take 2 g by mouth 2 (two) times daily.      Marland Kitchen oxymetazoline (AFRIN) 0.05 % nasal spray Place 2 sprays into the nose 2 (two) times daily as needed.     . polyethylene glycol (MIRALAX / GLYCOLAX) packet Take 17 g by mouth daily as needed. For constipation     No current facility-administered medications on file prior to visit.    Allergies  Allergen Reactions  . Latex     itching  . Lisinopril Cough  . Tramadol Hcl     REACTION: nausea, vomiting  . Ultram [Tramadol]     Nausea and vomitting     Past Medical History  Diagnosis Date  . Hypothyroidism   .  Hyperlipemia   . Hypertension   . Anxiety   . Degenerative disc disease   . CAD (coronary artery disease) 2005    s/p PCI LAD and cutting balloon angioplasty  with stent reexpansion 04/2008  . Angina pectoris ([place]) 2005    with stent placement Anterior decscending  . Positive PPD     neg. CXR, treated with INH for 1 yr.  about 22  . MI (myocardial infarction) ([place]) 2005    angio with stent  . Hypercholesterolemia   . Bone spur     Right Hip    Past Surgical History  Procedure Laterality Date  . Coronary angioplasty  2009  . Nerve root block  2007 & 2013    epidural steroids  . Coronary angioplasty with stent placement  2005  . Tubal ligation    . Tonsillectomy    . Laparoscopy      for endometriosis  . Ruptured disc  2007    with epidural   . Left heart catheterization with coronary angiogram Bilateral 06/27/2011    Procedure: LEFT HEART CATHETERIZATION WITH CORONARY ANGIOGRAM;  Surgeon: Burnell Blanks, MD;  Location: [facility] CATH LAB;  Service: Cardiovascular;  Laterality: Bilateral;  . Tongue biopsy      keratin accumulation - Benign     History  Smoking status  . Never Smoker   Smokeless tobacco  . Never Used    History  Alcohol Use  . 0.6 oz/week  . 1 Glasses of wine per week    Comment: occ wine    Family History  Problem Relation Age of Onset  . Colon cancer Mother 61  . Stroke Mother   . Hypertension Mother   . Diabetes Mother   . Rheum arthritis Mother   . Heart attack Father     x2  . Diabetes Father   . Kidney disease Father   . Stroke Maternal Grandmother   . Esophageal cancer Cousin 76    Reviw of Systems:  Reviewed in the HPI.  All other systems are negative.  Physical Exam: BP 104/80 mmHg  Pulse 80  Ht 5\' 5"  (1.651 m)  Wt 157 lb 12.8 oz (71.578 kg)  BMI 26.26 kg/m2  LMP 06/29/2006 The patient is alert and oriented x 3.  The mood and affect are normal.   Skin: warm and dry.  Color is normal.    HEENT:   the sclera are  nonicteric.  The mucous membranes are moist.  The carotids are  2+ without bruits.  There is no thyromegaly.  There is no JVD.   Lungs: clear.  The chest wall is non tender.   Heart: regular rate with a normal S1 and S2.  There are no murmurs, gallops, or rubs. The PMI is not displaced.    Abdomen: good bowel sounds.  There is no guarding or rebound.  There is no hepatosplenomegaly or tenderness.  There are no masses.  Extremities:  no clubbing, cyanosis, or edema.  The legs are without rashes.    Her right radial cath site is well-healed. There is a small scar associated with her site. Her pulses are intact. Neuro:  Cranial nerves II - XII are intact.  Motor and sensory functions are intact.   The gait is normal.  ECG :  - Normal sinus rhythm at 70 per chest no ST or T wave changes.  Assessment / Plan:   1. Coronary artery disease-  she's doing very well.  No angina.  Bruising has improved since she stopped her ASA.  Still on Plavix  . 2. anxiety 3. Hyperlipidemia -  we'll check fasting labs in 6 months , continue meds.  4. Hypertension  - blood pressure is  okay. 5. Hypothyroidism 6. Chronic back pain    Nahser, Wonda Cheng, MD  05/24/2015 10:00 AM    [address],  [place], [place]  [address] Pager [phone] Phone: [phone]; Fax: [phone]   [facility]  [address] [place], [place]  [address] [phone]   Fax [phone]

## 2015-05-24 NOTE — Patient Instructions (Signed)

## 2015-05-27 ENCOUNTER — Ambulatory Visit (HOSPITAL_COMMUNITY)
Admission: RE | Admit: 2015-05-27 | Discharge: 2015-05-27 | Disposition: A | Discharge status: Home / Self Care | Payer: PRIVATE HEALTH INSURANCE | Source: Ambulatory Visit | Attending: Orthopedic Surgery | Admitting: Orthopedic Surgery

## 2015-05-27 DIAGNOSIS — M67431 Ganglion, right wrist: Secondary | ICD-10-CM | POA: Insufficient documentation

## 2015-05-27 DIAGNOSIS — M1831 Unilateral post-traumatic osteoarthritis of first carpometacarpal joint, right hand: Secondary | ICD-10-CM | POA: Diagnosis present

## 2015-05-31 DIAGNOSIS — B9620 Unspecified Escherichia coli [E. coli] as the cause of diseases classified elsewhere: Secondary | ICD-10-CM | POA: Diagnosis not present

## 2015-05-31 DIAGNOSIS — N390 Urinary tract infection, site not specified: Secondary | ICD-10-CM | POA: Diagnosis not present

## 2015-05-31 DIAGNOSIS — Z0000 Encounter for general adult medical examination without abnormal findings: Secondary | ICD-10-CM | POA: Diagnosis not present

## 2015-05-31 HISTORY — DX: Urinary tract infection, site not specified: N39.0

## 2015-05-31 MED FILL — OXYBUTYNIN CL ER 10 MG TAB: 10 | 30 days supply | Qty: 30 | Fill #3

## 2015-05-31 MED FILL — URO-MP CAPSULE: 118 | 15 days supply | Qty: 30 | Fill #0

## 2015-05-31 MED FILL — IRBESARTAN 150 MG TABLET: 150 | 90 days supply | Qty: 90 | Fill #2

## 2015-05-31 MED FILL — ESCITALOPRAM 10 MG TABLET: 10 | 90 days supply | Qty: 90 | Fill #3

## 2015-05-31 MED FILL — OMEGA-3 ETHYL ESTERS 1 GM C: 1 | 90 days supply | Qty: 360 | Fill #3

## 2015-05-31 MED FILL — SULFAMETHOXAZOLE/TMP SS TAB: 400-80 | 7 days supply | Qty: 14 | Fill #0

## 2015-06-01 ENCOUNTER — Telehealth: Payer: Self-pay | Admitting: *Deleted

## 2015-06-01 ENCOUNTER — Ambulatory Visit (INDEPENDENT_AMBULATORY_CARE_PROVIDER_SITE_OTHER): Payer: 59 | Admitting: Gastroenterology

## 2015-06-01 ENCOUNTER — Encounter: Payer: Self-pay | Admitting: Gastroenterology

## 2015-06-01 VITALS — BP 102/70 | HR 72 | Ht 64.75 in | Wt 158.1 lb

## 2015-06-01 DIAGNOSIS — Z1211 Encounter for screening for malignant neoplasm of colon: Secondary | ICD-10-CM | POA: Diagnosis not present

## 2015-06-01 DIAGNOSIS — K5909 Other constipation: Secondary | ICD-10-CM | POA: Diagnosis not present

## 2015-06-01 DIAGNOSIS — Z800 Family history of malignant neoplasm of digestive organs: Secondary | ICD-10-CM

## 2015-06-01 MED ORDER — NA SULFATE-K SULFATE-MG SULF 17.5-3.13-1.6 GM/177ML PO SOLN: 1.0000 | Freq: Once | ORAL | Status: DC

## 2015-06-01 MED FILL — SUPREP BOWEL PREP KIT: 17.5-3.13-1 | 1 days supply | Qty: 354 | Fill #0

## 2015-06-01 MED FILL — LEVOTHYROXINE 125 MCG TAB: 125 | 90 days supply | Qty: 90 | Fill #3

## 2015-06-01 NOTE — Patient Instructions (Signed)
You have been scheduled for a colonoscopy. Please follow written instructions given to you at your visit today.  Please pick up your prep supplies at the pharmacy within the next 1-3 days. If you use inhalers (even only as needed), please bring them with you on the day of your procedure. Your physician has requested that you go to www.startemmi.com and enter the access code given to you at your visit today. This web site gives a general overview about your procedure. However, you should still follow specific instructions given to you by our office regarding your preparation for the procedure.  You will be contacted by our office prior to your procedure for directions on holding your Plavix.  If you do not hear from our office 1 week prior to your scheduled procedure, please call [phone] to discuss.  

## 2015-06-01 NOTE — Telephone Encounter (Signed)
  06/01/2015   RE: Sarah Ballard DOB: 1955-10-11 MRN: [id number]   Dear Dr Acie Fredrickson   We have scheduled the above patient for an endoscopic procedure. Our records show that she is on anticoagulation therapy.   Please advise as to how long the patient may come off her therapy of plavix prior to the procedure, which is scheduled for 07/08/2015.  Please fax back/ or route the completed form to [facility] at [phone].   Sincerely,    Genella Mech

## 2015-06-01 NOTE — Telephone Encounter (Signed)
Ms. Pecha may hold her plavix for 5 days prior to her endoscopic procedure

## 2015-06-01 NOTE — Progress Notes (Signed)
Sarah Ballard    [id number]    01/20/1956  Primary Care 12 M, MD  Referring Physician: Shon Baton, MD [address] [place], [place] [address]  Chief complaint:  Constipation  HPI: 60 year old female with family history of colon cancer here for follow-up visit. Patient has intermittent constipation and is currently using MiraLAX or Colace as needed. She also has a prolapsed uterus with cystocele and overactive bladder. she is currently being treated for UTI Her last Colonoscopy was in 2012 by Dr Olevia Perches 1 sessile polyp removed was polypoid normal mucosa with no adenoma. Prior to that she had a normal colonoscopy in 2006. Her mother had colon cancer at age 76 and paternal grandmother had colon cancer in her 40s. Denies any nausea, vomiting, abdominal pain, melena or bright red blood per rectum    Outpatient Encounter Prescriptions as of 06/01/2015  Medication Sig  . ALPRAZolam (XANAX) 0.5 MG tablet TAKE 1-3 TABLETS BY MOUTH DAILY AS DIRECTED  . Calcium Carbonate-Vitamin D (CALTRATE 600+D) 600-400 MG-UNIT per tablet Take 1 tablet by mouth daily as needed.   . clopidogrel (PLAVIX) 75 MG tablet TAKE 1 TABLET BY MOUTH DAILY  . CRESTOR 20 MG tablet TAKE 1 TABLET BY MOUTH ONCE DAILY  . cyclobenzaprine (FLEXERIL) 10 MG tablet Take 10 mg by mouth as needed.  . docusate sodium (COLACE) 100 MG capsule Take 100 mg by mouth as needed.    Marland Kitchen escitalopram (LEXAPRO) 10 MG tablet Take 10 mg by mouth at bedtime.   Marland Kitchen estradiol (ESTRACE) 0.1 MG/GM vaginal cream Pea size amount to urethra 3 times a week at HS  . fluticasone (FLONASE) 50 MCG/ACT nasal spray Place 2 sprays into the nose daily. (Patient taking differently: Place 2 sprays into the nose daily as needed. )  . glucosamine-chondroitin 500-400 MG tablet Take 1 tablet by mouth daily.   . Hydrocodone-Acetaminophen (VICODIN PO) Take by mouth as needed.  Marland Kitchen ibuprofen (ADVIL,MOTRIN) 200 MG tablet Take 600 mg by mouth every  6 (six) hours as needed. For pain  . irbesartan (AVAPRO) 150 MG tablet Take 1 tablet (150 mg total) by mouth daily.  Marland Kitchen levothyroxine (SYNTHROID, LEVOTHROID) 125 MCG tablet Take 125 mcg by mouth daily. 1 TABLET DAILY  . magnesium 30 MG tablet Take 30 mg by mouth 2 (two) times daily.  . Meth-Hyo-M Bl-Na Phos-Ph Sal (URIBEL) 118 MG CAPS Take 1 capsule by mouth 2 (two) times daily.  . metoprolol (LOPRESSOR) 50 MG tablet TAKE 1/2 TABLET BY MOUTH TWICE DAILY  . nitroGLYCERIN (NITROSTAT) 0.4 MG SL tablet Place 1 tablet (0.4 mg total) under the tongue every 5 (five) minutes as needed. For chest pain  . omega-3 acid ethyl esters (LOVAZA) 1 G capsule Take 2 g by mouth 2 (two) times daily.    Marland Kitchen omeprazole (PRILOSEC OTC) 20 MG tablet Take 20 mg by mouth. Everyday with supper  . oxybutynin (DITROPAN-XL) 10 MG 24 hr tablet Take 10 mg by mouth daily.  . polyethylene glycol (MIRALAX / GLYCOLAX) packet Take 17 g by mouth daily as needed. For constipation  . sulfamethoxazole-trimethoprim (BACTRIM DS) 800-160 MG tablet Take 1 tablet by mouth 2 (two) times daily.  Marland Kitchen oxymetazoline (AFRIN) 0.05 % nasal spray Place 2 sprays into the nose as needed. Reported on 06/01/2015   No facility-administered encounter medications on file as of 06/01/2015.    Allergies as of 06/01/2015 - Review Complete 06/01/2015  Allergen Reaction Noted  . Latex  05/04/2010  . Lisinopril Cough 06/19/2010  . Tramadol hcl  05/04/2010  . Ultram [tramadol]  09/18/2012    Past Medical History  Diagnosis Date  . Hypothyroidism   . Hyperlipemia   . Hypertension   . Anxiety   . Degenerative disc disease   . CAD (coronary artery disease) 2005    s/p PCI LAD and cutting balloon angioplasty  with stent reexpansion 04/2008  . Angina pectoris ([place]) 2005    with stent placement Anterior decscending  . Positive PPD     neg. CXR, treated with INH for 1 yr.  about 67  . MI (myocardial infarction) ([place]) 2005    angio with stent  .  Hypercholesterolemia   . Bone spur     Right Hip  . Arthritis   . Colon polyps   . Depression   . GERD (gastroesophageal reflux disease)     Past Surgical History  Procedure Laterality Date  . Coronary angioplasty  2009  . Nerve root block  2007 & 2013    epidural steroids  . Coronary angioplasty with stent placement  2005  . Tubal ligation    . Tonsillectomy    . Laparoscopy      for endometriosis  . Ruptured disc  2007    with epidural   . Left heart catheterization with coronary angiogram Bilateral 06/27/2011    Procedure: LEFT HEART CATHETERIZATION WITH CORONARY ANGIOGRAM;  Surgeon: Burnell Blanks, MD;  Location: [facility] CATH LAB;  Service: Cardiovascular;  Laterality: Bilateral;  . Tongue biopsy      keratin accumulation - Benign     Family History  Problem Relation Age of Onset  . Colon cancer Mother 43  . Stroke Mother   . Hypertension Mother   . Diabetes Mother   . Rheum arthritis Mother   . Heart attack Father     x2  . Diabetes Father   . Kidney disease Father   . Stroke Maternal Grandmother   . Esophageal cancer Cousin 53  . Atrial fibrillation Mother     Social History   Social History  . Marital Status: Married    Spouse Name: N/A  . Number of Children: 3  . Years of Education: N/A   Occupational History  . neonate RN    Social History Main Topics  . Smoking status: Never Smoker   . Smokeless tobacco: Never Used  . Alcohol Use: 0.6 oz/week    1 Glasses of wine per week     Comment: occ wine  . Drug Use: No  . Sexual Activity: Yes    Birth Control/ Protection: Post-menopausal     Comment: BTL 07/03/89   Other Topics Concern  . Not on file   Social History Narrative      Review of systems: Review of Systems  Constitutional: Negative for fever and chills.  HENT: Negative.   Eyes: Negative for blurred vision.  Respiratory: Negative for cough, shortness of breath and wheezing.   Cardiovascular: Negative for chest pain and  palpitations.  Gastrointestinal: as per HPI Genitourinary: Negative for dysuria, urgency, frequency and hematuria.  Musculoskeletal: Negative for myalgias, back pain and joint pain.  Skin: Negative for itching and rash.  Neurological: Negative for dizziness, tremors, focal weakness, seizures and loss of consciousness.  Endo/Heme/Allergies: Negative for environmental allergies.  Psychiatric/Behavioral: Negative for depression, suicidal ideas and hallucinations.  All other systems reviewed and are negative.   Physical Exam: Filed Vitals:   06/01/15 0909  BP: 102/70  Pulse: 72   Gen:      No acute distress HEENT:  EOMI, sclera anicteric Neck:     No masses; no thyromegaly Lungs:    Clear to auscultation bilaterally; normal respiratory effort CV:         Regular rate and rhythm; no murmurs Abd:      + bowel sounds; soft, non-tender; no palpable masses, no distension Ext:    No edema; adequate peripheral perfusion Skin:      Warm and dry; no rash Neuro: alert and oriented x 3 Psych: normal mood and affect  Data Reviewed:  As per history of present illness   Assessment and Plan/Recommendations:  60 year old female with family history of colon cancer here for follow-up visit Chronic constipation: Advise patient to take half a capful of MiraLAX daily at bedtime. Increase fluid and fiber intake We'll schedule for screening colonoscopy We'll discuss with cardiology regarding holding Plavix for 5-7 days prior to the procedure Return as needed  K. Denzil Magnuson , MD [phone] Mon-Fri 8a-5p [phone] after 5p, weekends, holidays

## 2015-06-02 NOTE — Telephone Encounter (Signed)
Spoke with Patient she is aware to hold plavix 5 days before procedure

## 2015-06-15 ENCOUNTER — Encounter: Payer: Self-pay | Admitting: Internal Medicine

## 2015-06-16 ENCOUNTER — Telehealth: Payer: Self-pay | Admitting: Nurse Practitioner

## 2015-06-16 NOTE — Telephone Encounter (Signed)
Return call to patient. Patient states she "keeps seeming to have issues in my personal area."  States she cant seem to stay dry and wears panty liners which then add to moisture issue. Recent UTI treated by Dr Matilde Sprang with IM Rocephin for E Coli. Patient had Diflucan called in in Novemeber  2016 and used refill in January 2017.  Now states she is beginning to have itching again. Not having "raging symptoms" yet but can tell something isn't right. Advised that office visit for evaluation to determine management is recommended. Appointment scheduled for 06-20-15 with Dr Quincy Simmonds. Advised if she would like to be seen tomorrow, she can call back and we will work her in sooner.  Patient declines work in today or tomorrow at this point. She is worried symptoms are not bad enough yet and she is caring for sick grandchild. Advised can try hydrocortisone ointment externally for itching if needed.      Advised Dr Quincy Simmonds will review call and we will call back if any additional instructions. Last OV 11-03-15 with Dr Quincy Simmonds, Last AEX 10-05-15 with Edman Circle FNP.

## 2015-06-16 NOTE — Telephone Encounter (Signed)
Patient calling requesting Diflucan for a "yeast infection." She is not available to come in today and declined to schedule an appointment due to keeping a "sick grandbaby."  Pharmacy on file is correct.

## 2015-06-16 NOTE — Telephone Encounter (Signed)
Call to patient, left message on VM with Dr Elza Rafter additional instructions. VM confirms "Sarah Ballard" and patient instructed at last call to use this number and leave message since she is caring for sick grandchild.  Encounter closed.

## 2015-06-16 NOTE — Telephone Encounter (Signed)
May try some over the counter Monistat.  If needs to use a pad, use one that is hypoallergenic.  I will see the patient on 2/20.

## 2015-06-20 ENCOUNTER — Encounter: Payer: Self-pay | Admitting: Obstetrics and Gynecology

## 2015-06-20 ENCOUNTER — Ambulatory Visit (INDEPENDENT_AMBULATORY_CARE_PROVIDER_SITE_OTHER): Payer: 59 | Admitting: Obstetrics and Gynecology

## 2015-06-20 VITALS — BP 108/68 | HR 60 | Resp 12 | Wt 158.0 lb

## 2015-06-20 DIAGNOSIS — L293 Anogenital pruritus, unspecified: Secondary | ICD-10-CM | POA: Diagnosis not present

## 2015-06-20 DIAGNOSIS — L298 Other pruritus: Secondary | ICD-10-CM | POA: Diagnosis not present

## 2015-06-20 DIAGNOSIS — Z658 Other specified problems related to psychosocial circumstances: Secondary | ICD-10-CM

## 2015-06-20 DIAGNOSIS — N952 Postmenopausal atrophic vaginitis: Secondary | ICD-10-CM | POA: Diagnosis not present

## 2015-06-20 DIAGNOSIS — F439 Reaction to severe stress, unspecified: Secondary | ICD-10-CM

## 2015-06-20 DIAGNOSIS — N898 Other specified noninflammatory disorders of vagina: Secondary | ICD-10-CM

## 2015-06-20 LAB — POCT URINALYSIS DIPSTICK
BILIRUBIN UA: NEGATIVE
GLUCOSE UA: NEGATIVE
KETONES UA: NEGATIVE
Leukocytes, UA: NEGATIVE
Nitrite, UA: NEGATIVE
Protein, UA: NEGATIVE
RBC UA: NEGATIVE
UROBILINOGEN UA: NEGATIVE
pH, UA: 5

## 2015-06-20 NOTE — Progress Notes (Signed)
GYNECOLOGY  VISIT   HPI: 60 y.o.   Divorced  white  female   G4P3 with Patient's last menstrual period was 06/29/2006.   here for  Vaginal itching and pain.  Diagnosed with UTI 05/2015 completed course of Sulfa. Having vaginal itching. No drainage but uncertain if this is urine. Some odor - fishy.  Used tissue paper at work to scratch, and she has caused a vaginal/perineal abrasion.  Using cortisone ointment.   Thinks she just had a systemic UTI.  Felt tired. Saw NP at [facility] for overactive bladder and UTI.  Had E Coli. Treated with Rocephin IM and then Septra bid for 7 days.  Also took Urabelle for 7 days.   UTI not related to sexual activity.  Not SA for 3 years. Not using vaginal estrogen cream.   Has first degree cystocele and rectocele. Good uterine support. Urine stream is not straight.   Urine dip - negative.  On Plavix.  Feeling overstressed with work and home.  Feels she needs to make a change in her medication.  Has an appt to see her PCP in March.  Denies suicidal ideation.   GYNECOLOGIC HISTORY: Patient's last menstrual period was 06/29/2006. Contraception:postmenopausal Menopausal hormone therapy: Estrace cream Last mammogram: 02/22/15 BIRADS Category 1 Negative Last pap smear: 10/05/2014 Neg HR HPV Neg  UA today:  Negative        OB History    Gravida Para Term Preterm AB TAB SAB Ectopic Multiple Living   4 3                 Patient Active Problem List   Diagnosis Date Noted  . Chest pain 06/26/2011  . Degenerative disc disease   . THYROID DISORDER 05/10/2010  . HYPERTENSION 05/10/2010  . ARTHRITIS 05/10/2010  . Hyperlipidemia 05/04/2010  . ANXIETY 05/04/2010  . DEPRESSION 05/04/2010  . CAD (coronary artery disease) 05/04/2010  . HEMORRHOIDS 05/04/2010  . HEADACHE, CHRONIC 05/04/2010    Past Medical History  Diagnosis Date  . Hypothyroidism   . Hyperlipemia   . Hypertension   . Anxiety   . Degenerative disc disease   . CAD  (coronary artery disease) 2005    s/p PCI LAD and cutting balloon angioplasty  with stent reexpansion 04/2008  . Angina pectoris ([place]) 2005    with stent placement Anterior decscending  . Positive PPD     neg. CXR, treated with INH for 1 yr.  about 83  . MI (myocardial infarction) ([place]) 2005    angio with stent  . Hypercholesterolemia   . Bone spur     Right Hip  . Arthritis   . Colon polyps   . Depression   . GERD (gastroesophageal reflux disease)     Past Surgical History  Procedure Laterality Date  . Coronary angioplasty  2009  . Nerve root block  2007 & 2013    epidural steroids  . Coronary angioplasty with stent placement  2005  . Tubal ligation    . Tonsillectomy    . Laparoscopy      for endometriosis  . Ruptured disc  2007    with epidural   . Left heart catheterization with coronary angiogram Bilateral 06/27/2011    Procedure: LEFT HEART CATHETERIZATION WITH CORONARY ANGIOGRAM;  Surgeon: Burnell Blanks, MD;  Location: [facility] CATH LAB;  Service: Cardiovascular;  Laterality: Bilateral;  . Tongue biopsy      keratin accumulation - Benign     Current Outpatient Prescriptions  Medication Sig Dispense Refill  . ALPRAZolam (XANAX) 0.5 MG tablet TAKE 1-3 TABLETS BY MOUTH DAILY AS DIRECTED 30 tablet 0  . Calcium Carbonate-Vitamin D (CALTRATE 600+D) 600-400 MG-UNIT per tablet Take 1 tablet by mouth daily as needed.     . clopidogrel (PLAVIX) 75 MG tablet TAKE 1 TABLET BY MOUTH DAILY 90 tablet 3  . CRESTOR 20 MG tablet TAKE 1 TABLET BY MOUTH ONCE DAILY 90 tablet 3  . cyclobenzaprine (FLEXERIL) 10 MG tablet Take 10 mg by mouth as needed.    . docusate sodium (COLACE) 100 MG capsule Take 100 mg by mouth as needed.      Marland Kitchen escitalopram (LEXAPRO) 10 MG tablet Take 10 mg by mouth at bedtime.     Marland Kitchen estradiol (ESTRACE) 0.1 MG/GM vaginal cream Pea size amount to urethra 3 times a week at HS 42.5 g 3  . fluticasone (FLONASE) 50 MCG/ACT nasal spray Place 2 sprays into the nose  daily. (Patient taking differently: Place 2 sprays into the nose daily as needed. ) 1 g 2  . glucosamine-chondroitin 500-400 MG tablet Take 1 tablet by mouth daily.     . Hydrocodone-Acetaminophen (VICODIN PO) Take by mouth as needed.    Marland Kitchen ibuprofen (ADVIL,MOTRIN) 200 MG tablet Take 600 mg by mouth every 6 (six) hours as needed. For pain    . irbesartan (AVAPRO) 150 MG tablet Take 1 tablet (150 mg total) by mouth daily. 90 tablet 3  . levothyroxine (SYNTHROID, LEVOTHROID) 125 MCG tablet Take 125 mcg by mouth daily. 1 TABLET DAILY    . magnesium 30 MG tablet Take 30 mg by mouth 2 (two) times daily.    . Meth-Hyo-M Bl-Na Phos-Ph Sal (URIBEL) 118 MG CAPS Take 1 capsule by mouth 2 (two) times daily.    . metoprolol (LOPRESSOR) 50 MG tablet TAKE 1/2 TABLET BY MOUTH TWICE DAILY 90 tablet 2  . Na Sulfate-K Sulfate-Mg Sulf (SUPREP BOWEL PREP) SOLN Take 1 kit by mouth once. 1 Bottle 0  . nitroGLYCERIN (NITROSTAT) 0.4 MG SL tablet Place 1 tablet (0.4 mg total) under the tongue every 5 (five) minutes as needed. For chest pain 25 tablet 6  . omega-3 acid ethyl esters (LOVAZA) 1 G capsule Take 2 g by mouth 2 (two) times daily.      Marland Kitchen omeprazole (PRILOSEC OTC) 20 MG tablet Take 20 mg by mouth. Everyday with supper    . oxybutynin (DITROPAN-XL) 10 MG 24 hr tablet Take 10 mg by mouth daily.  11  . oxymetazoline (AFRIN) 0.05 % nasal spray Place 2 sprays into the nose as needed. Reported on 06/01/2015    . polyethylene glycol (MIRALAX / GLYCOLAX) packet Take 17 g by mouth daily as needed. For constipation    . sulfamethoxazole-trimethoprim (BACTRIM DS) 800-160 MG tablet Take 1 tablet by mouth 2 (two) times daily.     No current facility-administered medications for this visit.     ALLERGIES: Latex; Lisinopril; Tramadol hcl; and Ultram  Family History  Problem Relation Age of Onset  . Colon cancer Mother 22  . Stroke Mother   . Hypertension Mother   . Diabetes Mother   . Rheum arthritis Mother   . Heart  attack Father     x2  . Diabetes Father   . Kidney disease Father   . Stroke Maternal Grandmother   . Esophageal cancer Cousin 3  . Atrial fibrillation Mother     Social History   Social History  . Marital Status: Married  Spouse Name: N/A  . Number of Children: 3  . Years of Education: N/A   Occupational History  . neonate RN    Social History Main Topics  . Smoking status: Never Smoker   . Smokeless tobacco: Never Used  . Alcohol Use: 0.6 oz/week    1 Glasses of wine per week     Comment: occ wine  . Drug Use: No  . Sexual Activity: Yes    Birth Control/ Protection: Post-menopausal     Comment: BTL 07/03/89   Other Topics Concern  . Not on file   Social History Narrative    ROS:  Pertinent items are noted in HPI.  PHYSICAL EXAMINATION:    LMP 06/29/2006    General appearance: alert, cooperative and appears stated age    Pelvic: External genitalia:  no lesions.  Mild erythema of the vulva.  Ecchymoses of the left labia               Urethra:  normal appearing urethra with no masses, tenderness or lesions              Bartholins and Skenes: normal                 Vagina: normal appearing vagina with normal color and discharge, no lesions.  Loss of rugae. No discharge at all.              Cervix: no lesions       Bimanual Exam:  Uterus:  normal size, contour, position, consistency, mobility, non-tender              Adnexa: normal adnexa and no mass, fullness, tenderness              Chaperone was present for exam.  ASSESSMENT   Vaginitis.   I suspect vaginal atrophy.  Situational stress.  PLAN  Counseled regarding forms of vaginitis.  Affirm done.  Tx will be pending this result. Use vaginal estrogen 1/2 gm pv at hs for 2 weeks, then 2 - 3 times per week.  Discussed risks of DVT, PE, MI, stroke, and breast cancer.  Gave information for counseling through [place].  Encouraged call to her PCP to discuss her Lexapro and Xanax.   An After Visit  Summary was printed and given to the patient.  _25_____ minutes face to face time of which over 50% was spent in counseling.

## 2015-06-20 NOTE — Patient Instructions (Signed)
Use the vaginal estrogen cream 1/2 gram per vagina in the evening for 2 weeks. Then use 1/2 gram per vagina in the evening 2 - 3 times per week.

## 2015-06-21 ENCOUNTER — Telehealth: Payer: Self-pay

## 2015-06-21 LAB — WET PREP BY MOLECULAR PROBE
Candida species: NEGATIVE
Gardnerella vaginalis: NEGATIVE
TRICHOMONAS VAG: NEGATIVE

## 2015-06-21 MED FILL — CRESTOR 20 MG TABLET: 20 | 90 days supply | Qty: 90 | Fill #3

## 2015-06-21 MED FILL — CLOPIDOGREL 75 MG TABLET: 75 | 90 days supply | Qty: 90 | Fill #2

## 2015-06-21 NOTE — Telephone Encounter (Signed)
-----   Message from Nunzio Cobbs, MD sent at 06/21/2015  1:58 PM EST ----- Please let patient know of her negative Affirm testing.  I discussed with her yesterday my impression that she has atrophic vaginitis.  She was planning to use her vaginal estrogen cream more regularly.  This should really help her symptoms!  Cc- Marisa Sprinkles

## 2015-06-21 NOTE — Telephone Encounter (Signed)
Spoke with patient. Advised of message as seen below from [facility]. She is agreeable and verbalizes understanding.  Routing to provider for final review. Patient agreeable to disposition. Will close encounter.

## 2015-06-23 MED FILL — ALPRAZolam 0.5 MG TABS: 0.5 | 20 days supply | Qty: 60 | Fill #0

## 2015-06-28 MED FILL — OXYBUTYNIN CL ER 10 MG TAB: 10 | 30 days supply | Qty: 30 | Fill #4

## 2015-06-29 HISTORY — PX: HEMORRHOID SURGERY: SHX153

## 2015-07-05 DIAGNOSIS — E784 Other hyperlipidemia: Secondary | ICD-10-CM | POA: Diagnosis not present

## 2015-07-05 DIAGNOSIS — I10 Essential (primary) hypertension: Secondary | ICD-10-CM | POA: Diagnosis not present

## 2015-07-05 DIAGNOSIS — E038 Other specified hypothyroidism: Secondary | ICD-10-CM | POA: Diagnosis not present

## 2015-07-05 DIAGNOSIS — Z0000 Encounter for general adult medical examination without abnormal findings: Secondary | ICD-10-CM | POA: Diagnosis not present

## 2015-07-08 ENCOUNTER — Encounter: Payer: Self-pay | Admitting: Gastroenterology

## 2015-07-08 ENCOUNTER — Ambulatory Visit (AMBULATORY_SURGERY_CENTER): Payer: 59 | Admitting: Gastroenterology

## 2015-07-08 VITALS — BP 130/76 | HR 57 | Temp 97.1°F | Resp 18 | Ht 64.75 in | Wt 158.0 lb

## 2015-07-08 DIAGNOSIS — Z1211 Encounter for screening for malignant neoplasm of colon: Secondary | ICD-10-CM | POA: Diagnosis present

## 2015-07-08 DIAGNOSIS — Z800 Family history of malignant neoplasm of digestive organs: Secondary | ICD-10-CM

## 2015-07-08 DIAGNOSIS — I10 Essential (primary) hypertension: Secondary | ICD-10-CM | POA: Diagnosis not present

## 2015-07-08 DIAGNOSIS — I2510 Atherosclerotic heart disease of native coronary artery without angina pectoris: Secondary | ICD-10-CM | POA: Diagnosis not present

## 2015-07-08 DIAGNOSIS — I252 Old myocardial infarction: Secondary | ICD-10-CM | POA: Diagnosis not present

## 2015-07-08 MED ORDER — SODIUM CHLORIDE 0.9 % IV SOLN: 500.0000 mL | INTRAVENOUS | Status: DC

## 2015-07-08 NOTE — Progress Notes (Signed)
Attempted to reach [facility] in Dr. Woodward Ku office to schedule hemorrhoid banding.  Left message to call 4th floor.  Did not hear from her prior to pt. Discharge.  Advised Pt. To call office to schedule appointment with Dr. Silverio Decamp April 4th.

## 2015-07-08 NOTE — Op Note (Addendum)
[place] Patient Name: Sarah Ballard Procedure Date: 07/08/2015 2:03 PM MRN: [id number] Endoscopist: Mauri Pole , MD Age: 60 Referring MD:  Date of Birth: 1956/01/17 Gender: Female Procedure:                Colonoscopy Indications:              Screening patient at increased risk: Family history                            of 1st-degree relative with colorectal cancer at                            age 26 years (or older) Medicines:                Monitored Anesthesia Care Procedure:                Pre-Anesthesia Assessment:                           - Prior to the procedure, a History and Physical                            was performed, and patient medications and                            allergies were reviewed. The patient's tolerance of                            previous anesthesia was also reviewed. The risks                            and benefits of the procedure and the sedation                            options and risks were discussed with the patient.                            All questions were answered, and informed consent                            was obtained. Prior Anticoagulants: The patient                            last took Plavix (clopidogrel) 5 days prior to the                            procedure. ASA Grade Assessment: II - A patient                            with mild systemic disease. After reviewing the                            risks and benefits, the patient was deemed in  satisfactory condition to undergo the procedure.                           After obtaining informed consent, the colonoscope                            was passed under direct vision. Throughout the                            procedure, the patient's blood pressure, pulse, and                            oxygen saturations were monitored continuously. The                            Model CF-HQ190L [id number] scope was introduced                             through the anus and advanced to the the terminal                            ileum, with identification of the appendiceal                            orifice and IC valve. The colonoscopy was performed                            without difficulty. The patient tolerated the                            procedure well. The quality of the bowel                            preparation was good. The terminal ileum, ileocecal                            valve, appendiceal orifice, and rectum were                            photographed. Scope In: 2:13:10 PM Scope Out: 2:30:46 PM Scope Withdrawal Time: 0 hours 9 minutes 44 seconds  Total Procedure Duration: 0 hours 17 minutes 36 seconds  Findings:      The perianal and digital rectal examinations were normal. Pertinent       negatives include normal sphincter tone.      Non-bleeding internal hemorrhoids were found during retroflexion. The       hemorrhoids were small.      The exam was otherwise without abnormality. Complications:            No immediate complications. Estimated Blood Loss:     Estimated blood loss: none. Impression:               - Non-bleeding internal hemorrhoids.                           - The examination  was otherwise normal.                           - No specimens collected. Recommendation:           - Patient has a contact number available for                            emergencies. The signs and symptoms of potential                            delayed complications were discussed with the                            patient. Return to normal activities tomorrow.                            Written discharge instructions were provided to the                            patient.                           - Resume previous diet.                           - Continue present medications.                           - Resume Plavix (clopidogrel) at prior dose today.                           - Repeat  colonoscopy in 10 years for screening                            purposes. Procedure Code(s):        --- Professional ---                           [id number], Colorectal cancer screening; colonoscopy on                            individual at high risk CPT copyright 2016 [employer] Association. All rights reserved. Mauri Pole, MD 07/08/2015 2:37:40 PM This report has been signed electronically. Number of Addenda: 0

## 2015-07-08 NOTE — Patient Instructions (Signed)

## 2015-07-08 NOTE — Progress Notes (Signed)
Pt. Had numerous brusing to extremities lower and upper and stated that she has a rash to back,but it is not contagious, she receive injections in her back.

## 2015-07-08 NOTE — Progress Notes (Signed)
A/ox3, pleased with MAC, report to RN 

## 2015-07-11 ENCOUNTER — Telehealth: Payer: Self-pay

## 2015-07-11 NOTE — Telephone Encounter (Signed)
  Follow up Call-  Call back number 07/08/2015  Post procedure Call Back phone  # [phone]  Permission to leave phone message Yes    Patient was called for follow up after her procedure on 07/08/2015. No answer at the number given for follow up phone call. A message was left on the answering machine.

## 2015-07-12 MED FILL — FLUTICASONE PROP 50 MCG SPR: 50 | 90 days supply | Qty: 48 | Fill #0

## 2015-07-13 MED FILL — NITROGLYCERIN 0.4 MG TAB SL: 0.4 | 13 days supply | Qty: 25 | Fill #1

## 2015-07-13 MED FILL — HYDROCODON-APAP 5-325: 5-325 | 20 days supply | Qty: 40 | Fill #0

## 2015-07-19 ENCOUNTER — Other Ambulatory Visit: Payer: Self-pay | Admitting: Cardiovascular Disease

## 2015-07-19 MED FILL — ALPRAZolam 0.5 MG TABS: 0.5 | 20 days supply | Qty: 60 | Fill #1

## 2015-07-20 MED FILL — METOPROLOL TARTRATE 50 MG T: 50 | 90 days supply | Qty: 90 | Fill #0

## 2015-07-26 MED FILL — OXYBUTYNIN CL ER 10 MG TAB: 10 | 30 days supply | Qty: 30 | Fill #5

## 2015-08-02 ENCOUNTER — Encounter: Payer: Self-pay | Admitting: Gastroenterology

## 2015-08-02 ENCOUNTER — Ambulatory Visit (INDEPENDENT_AMBULATORY_CARE_PROVIDER_SITE_OTHER): Payer: Self-pay | Admitting: Gastroenterology

## 2015-08-02 ENCOUNTER — Telehealth: Payer: Self-pay | Admitting: *Deleted

## 2015-08-02 VITALS — Ht 64.75 in | Wt 159.0 lb

## 2015-08-02 DIAGNOSIS — K649 Unspecified hemorrhoids: Secondary | ICD-10-CM

## 2015-08-02 NOTE — Telephone Encounter (Signed)
Ms. Kassel may hold her Plavix for 5 days prior to endoscopy procedure. She is at low risk for her procedure .

## 2015-08-02 NOTE — Patient Instructions (Addendum)
Your 1st banding is scheduled on 09/27/2015 at 4 pm  We will contact you about holding your blood thinner Plavix before your banding

## 2015-08-02 NOTE — Telephone Encounter (Signed)
  08/02/2015   RE: Sarah Ballard DOB: August 01, 1955 MRN: [id number]   Dear  Cathie Olden,    We have scheduled the above patient for an endoscopic procedure. Our records show that she is on anticoagulation therapy.   Please advise as to how long the patient may come off her therapy of Plavix prior to the procedure, which is scheduled for 09/27/2015.  Please fax back/ or route the completed form to [place] at [phone].   Sincerely,    Genella Mech

## 2015-08-03 NOTE — Telephone Encounter (Signed)
L/M FOR PT OK TO HOLD PLAVIX 5 DAYS BEFORE BANDING

## 2015-08-17 MED FILL — ALPRAZolam 0.5 MG TABS: 0.5 | 20 days supply | Qty: 60 | Fill #2

## 2015-08-23 MED FILL — OXYBUTYNIN CL ER 10 MG TAB: 10 | 30 days supply | Qty: 30 | Fill #6

## 2015-08-23 MED FILL — IRBESARTAN 150 MG TABLET: 150 | 90 days supply | Qty: 90 | Fill #3

## 2015-08-24 MED FILL — ESCITALOPRAM 20 MG TABLET: 20 | 90 days supply | Qty: 90 | Fill #0

## 2015-09-15 ENCOUNTER — Other Ambulatory Visit: Payer: Self-pay | Admitting: Cardiovascular Disease

## 2015-09-15 MED FILL — OMEGA-3 ETHYL ESTERS 1 GM C: 1 | 90 days supply | Qty: 360 | Fill #0

## 2015-09-15 MED FILL — ALPRAZolam 0.5 MG TABS: 0.5 | 20 days supply | Qty: 60 | Fill #3

## 2015-09-16 ENCOUNTER — Other Ambulatory Visit: Payer: Self-pay | Admitting: Cardiovascular Disease

## 2015-09-16 MED ORDER — ROSUVASTATIN CALCIUM 20 MG PO TABS: 20.0000 mg | ORAL_TABLET | Freq: Every day | ORAL | Status: DC

## 2015-09-16 MED FILL — ROSUVASTATIN CALCIUM 20 MG: 20 | 90 days supply | Qty: 90 | Fill #0

## 2015-09-27 ENCOUNTER — Ambulatory Visit (INDEPENDENT_AMBULATORY_CARE_PROVIDER_SITE_OTHER): Payer: 59 | Admitting: Gastroenterology

## 2015-09-27 ENCOUNTER — Encounter: Payer: Self-pay | Admitting: Gastroenterology

## 2015-09-27 VITALS — BP 100/68 | HR 60 | Ht 64.75 in | Wt 163.0 lb

## 2015-09-27 DIAGNOSIS — K641 Second degree hemorrhoids: Secondary | ICD-10-CM | POA: Diagnosis not present

## 2015-09-27 MED FILL — CYCLOBENZAPRINE 10 MG TAB: 10 | 10 days supply | Qty: 30 | Fill #1

## 2015-09-27 MED FILL — CLOPIDOGREL 75 MG TABLET: 75 | 90 days supply | Qty: 90 | Fill #3

## 2015-09-27 MED FILL — OXYBUTYNIN CL ER 10 MG TAB: 10 | 30 days supply | Qty: 30 | Fill #7

## 2015-09-27 NOTE — Patient Instructions (Addendum)
HEMORRHOID BANDING PROCEDURE    FOLLOW-UP CARE   1. The procedure you have had should have been relatively painless since the banding of the area involved does not have nerve endings and there is no pain sensation.  The rubber band cuts off the blood supply to the hemorrhoid and the band may fall off as soon as 48 hours after the banding (the band may occasionally be seen in the toilet bowl following a bowel movement). You may notice a temporary feeling of fullness in the rectum which should respond adequately to plain Tylenol or Motrin.  2. Following the banding, avoid strenuous exercise that evening and resume full activity the next day.  A sitz bath (soaking in a warm tub) or bidet is soothing, and can be useful for cleansing the area after bowel movements.     3. To avoid constipation, take two tablespoons of natural wheat bran, natural oat bran, flax, Benefiber or any over the counter fiber supplement and increase your water intake to 7-8 glasses daily.    4. Unless you have been prescribed anorectal medication, do not put anything inside your rectum for two weeks: No suppositories, enemas, fingers, etc.  5. Occasionally, you may have more bleeding than usual after the banding procedure.  This is often from the untreated hemorrhoids rather than the treated one.  Don't be concerned if there is a tablespoon or so of blood.  If there is more blood than this, lie flat with your bottom higher than your head and apply an ice pack to the area. If the bleeding does not stop within a half an hour or if you feel faint, call our office at [phone] or go to the emergency room.  6. Problems are not common; however, if there is a substantial amount of bleeding, severe pain, chills, fever or difficulty passing urine (very rare) or other problems, you should call us at [phone] or report to the nearest emergency room.  7. Do not stay seated continuously for more than 2-3 hours for a day or two  after the procedure.  Tighten your buttock muscles 10-15 times every two hours and take 10-15 deep breaths every 1-2 hours.  Do not spend more than a few minutes on the toilet if you cannot empty your bowel; instead re-visit the toilet at a later time.   Restart Plavix in 2 days Hold plavix 5 days before next banding scheduled on 11/29/2015 at 4

## 2015-09-27 NOTE — Progress Notes (Signed)
  PROCEDURE NOTE: The patient presents with symptomatic grade II hemorrhoids, requesting rubber band ligation of his/her hemorrhoidal disease.  All risks, benefits and alternative forms of therapy were described and informed consent was obtained.  In the Left Lateral Decubitus position anoscopic examination revealed grade II hemorrhoids in the R anterior and Right posterior and Left lateral position(s).  The anorectum was pre-medicated with 0.125% nitroglycerine The decision was made to band the R anterior internal hemorrhoid, and the [place] was used to perform band ligation without complication.  Digital anorectal examination was then performed to assure proper positioning of the band, and to adjust the banded tissue as required.  The patient was discharged home without pain or other issues.  Dietary and behavioral recommendations were given and along with follow-up instructions.     The following adjunctive treatments were recommended: Benefiber 1 tablespoon TID Ok to restart Plavix in 2 days on June 1st, 2017  The patient will return in 4 weeks for  follow-up and possible additional banding as required. Will discuss with Cardiology if ok to hold Plavix prior to next band ligation  No complications were encountered and the patient tolerated the procedure well.  Damaris Hippo , MD [phone] Mon-Fri 8a-5p [phone] after 5p, weekends, holidays

## 2015-10-11 ENCOUNTER — Encounter: Payer: Self-pay | Admitting: Nurse Practitioner

## 2015-10-11 ENCOUNTER — Ambulatory Visit (INDEPENDENT_AMBULATORY_CARE_PROVIDER_SITE_OTHER): Payer: 59 | Admitting: Nurse Practitioner

## 2015-10-11 VITALS — BP 100/60 | HR 66 | Resp 20 | Ht 65.25 in | Wt 162.0 lb

## 2015-10-11 DIAGNOSIS — Z01419 Encounter for gynecological examination (general) (routine) without abnormal findings: Secondary | ICD-10-CM

## 2015-10-11 DIAGNOSIS — Z0000 Encounter for general adult medical examination without abnormal findings: Secondary | ICD-10-CM

## 2015-10-11 DIAGNOSIS — N3946 Mixed incontinence: Secondary | ICD-10-CM | POA: Diagnosis not present

## 2015-10-11 MED ORDER — ESTRADIOL 0.1 MG/GM VA CREA: TOPICAL_CREAM | VAGINAL | Status: DC

## 2015-10-11 MED FILL — ESTRACE 0.01% CREAM: 0.1 | 90 days supply | Qty: 43 | Fill #0

## 2015-10-11 NOTE — Patient Instructions (Signed)

## 2015-10-11 NOTE — Progress Notes (Signed)
60 y.o. G4P3 Divorced  Caucasian Fe here for annual exam.  Getting epidural steroid injections for her back.  Also had hemorrhoid banding and last procedure 5/30 and due again in 11/2015. She has pain in the legs.  Son age 77 is still living with her and it is very stressful.  Father age 63 and lives with her and is doing fairly well.  Divorced since last November, married for 7.5 yrs.  Patient's last menstrual period was 06/29/2006.          Sexually active: Yes.    The current method of family planning is post menopausal status.    Exercising: Yes.    Walking, stationary bike Smoker:  no  Health Maintenance: Pap:  10/05/14 Neg. HR HPV:neg MMG:  02/18/15 BIRADS1:neg Colonoscopy:  07/08/15 Normal - f/u 10 years  BMD:   2011 Normal  TDaP:  05/10/2015 with [facility] Shingles: 03/2015  Pneumonia: Done a few years ago  Hep C and HIV: No Labs: PCP   reports that she has never smoked. She has never used smokeless tobacco. She reports that she drinks about 0.6 oz of alcohol per week. She reports that she does not use illicit drugs.  Past Medical History  Diagnosis Date  . Hypothyroidism   . Hyperlipemia   . Hypertension   . Anxiety   . Degenerative disc disease   . CAD (coronary artery disease) 2005    s/p PCI LAD and cutting balloon angioplasty  with stent reexpansion 04/2008  . Angina pectoris ([place]) 2005    with stent placement Anterior decscending  . Positive PPD     neg. CXR, treated with INH for 1 yr.  about 19  . MI (myocardial infarction) ([place]) 2005    angio with stent  . Hypercholesterolemia   . Bone spur     Right Hip  . Arthritis   . Colon polyps   . Depression   . GERD (gastroesophageal reflux disease)   . UTI (urinary tract infection) 05/31/15    Past Surgical History  Procedure Laterality Date  . Coronary angioplasty  2009  . Nerve root block      epidural steroids; multiple  . Coronary angioplasty with stent placement  2005  . Tubal ligation    . Tonsillectomy    .  Laparoscopy      for endometriosis  . Ruptured disc  2007    with epidural   . Left heart catheterization with coronary angiogram Bilateral 06/27/2011    Procedure: LEFT HEART CATHETERIZATION WITH CORONARY ANGIOGRAM;  Surgeon: Burnell Blanks, MD;  Location: [facility] LAB;  Service: Cardiovascular;  Laterality: Bilateral;  . Tongue biopsy      keratin accumulation - Benign   . Colonoscopy    . Polypectomy    . Hemorrhoid surgery  06/2015    Removal   . Lumbar epidural injection      many    Current Outpatient Prescriptions  Medication Sig Dispense Refill  . acetaminophen (TYLENOL) 500 MG tablet Take 500 mg by mouth every 6 (six) hours as needed.    . ALPRAZolam (XANAX) 0.5 MG tablet TAKE 1-3 TABLETS BY MOUTH DAILY AS DIRECTED 30 tablet 0  . Calcium Carbonate-Vitamin D (CALTRATE 600+D) 600-400 MG-UNIT per tablet Take 1 tablet by mouth daily as needed.     . clopidogrel (PLAVIX) 75 MG tablet TAKE 1 TABLET BY MOUTH DAILY 90 tablet 3  . cyclobenzaprine (FLEXERIL) 10 MG tablet Take 10 mg by mouth as needed.    Marland Kitchen  docusate sodium (COLACE) 100 MG capsule Take 100 mg by mouth as needed.      Marland Kitchen estradiol (ESTRACE) 0.1 MG/GM vaginal cream Pea size amount to urethra 3 times a week at HS 42.5 g 3  . fluticasone (FLONASE) 50 MCG/ACT nasal spray Place 2 sprays into the nose daily. (Patient taking differently: Place 2 sprays into the nose daily as needed. ) 1 g 2  . glucosamine-chondroitin 500-400 MG tablet Take 1 tablet by mouth daily.     . Hydrocodone-Acetaminophen (VICODIN PO) Take by mouth as needed.    Marland Kitchen ibuprofen (ADVIL,MOTRIN) 200 MG tablet Take 600 mg by mouth every 6 (six) hours as needed. For pain    . irbesartan (AVAPRO) 150 MG tablet Take 1 tablet (150 mg total) by mouth daily. 90 tablet 3  . levothyroxine (SYNTHROID, LEVOTHROID) 125 MCG tablet Take 125 mcg by mouth daily. 1 TABLET DAILY    . magnesium 30 MG tablet Take 30 mg by mouth 2 (two) times daily. Reported on 07/08/2015    .  metoprolol (LOPRESSOR) 50 MG tablet TAKE 1/2 TABLET BY MOUTH TWICE DAILY 90 tablet 2  . omega-3 acid ethyl esters (LOVAZA) 1 G capsule Take 2 g by mouth 2 (two) times daily.      Marland Kitchen omeprazole (PRILOSEC OTC) 20 MG tablet Take 20 mg by mouth. Everyday with supper    . oxybutynin (DITROPAN-XL) 10 MG 24 hr tablet Take 10 mg by mouth daily.  11  . polyethylene glycol (MIRALAX / GLYCOLAX) packet Take 17 g by mouth daily as needed. For constipation    . rosuvastatin (CRESTOR) 20 MG tablet Take 1 tablet (20 mg total) by mouth daily. 90 tablet 2  . Wheat Dextrin (BENEFIBER PO) Take by mouth daily.    Marland Kitchen escitalopram (LEXAPRO) 20 MG tablet Take 1 tablet by mouth daily. Reported on 10/11/2015  3  . nitroGLYCERIN (NITROSTAT) 0.4 MG SL tablet Place 1 tablet (0.4 mg total) under the tongue every 5 (five) minutes as needed. For chest pain (Patient not taking: Reported on 10/11/2015) 25 tablet 6   No current facility-administered medications for this visit.    Family History  Problem Relation Age of Onset  . Colon cancer Mother 19  . Stroke Mother   . Hypertension Mother   . Diabetes Mother   . Rheum arthritis Mother   . Heart attack Father     x2  . Diabetes Father   . Kidney disease Father   . Stroke Maternal Grandmother   . Esophageal cancer Cousin 83  . Atrial fibrillation Mother   . Colon cancer Paternal Grandmother   . Diabetes Paternal Grandmother   . Heart disease Father     ROS:  Pertinent items are noted in HPI.  Otherwise, a comprehensive ROS was negative.  Exam:   BP 100/60 mmHg  Pulse 66  Resp 20  Ht 5' 5.25" (1.657 m)  Wt 162 lb (73.483 kg)  BMI 26.76 kg/m2  LMP 06/29/2006 Height: 5' 5.25" (165.7 cm) Ht Readings from Last 3 Encounters:  10/11/15 5' 5.25" (1.657 m)  09/27/15 5' 4.75" (1.645 m)  08/02/15 5' 4.75" (1.645 m)    General appearance: alert, cooperative and appears stated age Head: Normocephalic, without obvious abnormality, atraumatic Neck: no adenopathy,  supple, symmetrical, trachea midline and thyroid normal to inspection and palpation Lungs: clear to auscultation bilaterally Breasts: normal appearance, no masses or tenderness Heart: regular rate and rhythm Abdomen: soft, non-tender; no masses,  no organomegaly Extremities: extremities  normal, atraumatic, no cyanosis or edema Skin: Skin color, texture, turgor normal. No rashes or lesions Lymph nodes: Cervical, supraclavicular, and axillary nodes normal. No abnormal inguinal nodes palpated Neurologic: Grossly normal   Pelvic: External genitalia:  no lesions              Urethra:  normal appearing urethra with no masses, tenderness or lesions              Bartholin's and Skene's: normal                 Vagina: normal appearing vagina with normal color and discharge, no lesions              Cervix: anteverted              Pap taken: No. Bimanual Exam:  Uterus:  normal size, contour, position, consistency, mobility, non-tender              Adnexa: no mass, fullness, tenderness               Rectovaginal: Confirms               Anus:  normal sphincter tone, no lesions  Chaperone present: no  A:  Well Woman with normal exam  Postmenopausal no HRT History of MI and stents, HTN, on anticoagulation Atrophic vaginitis Situational anxiety and stress History of OAB and sees urologist   P:   Reviewed health and wellness pertinent to exam  Pap smear as above  Mammogram is due 01/2015  Follow with labs  Counseled on breast self exam, use and side effects of HRT, adequate intake of calcium and vitamin D, diet and exercise return annually or prn  An After Visit Summary was printed and given to the patient.

## 2015-10-12 LAB — HIV ANTIBODY (ROUTINE TESTING W REFLEX): HIV 1&2 Ab, 4th Generation: NONREACTIVE

## 2015-10-12 LAB — HEPATITIS C ANTIBODY: HCV Ab: NEGATIVE

## 2015-10-17 NOTE — Progress Notes (Signed)
Encounter reviewed Tyyonna Soucy, MD   

## 2015-10-18 MED FILL — LEVOTHYROXINE 125 MCG TAB: 125 | 90 days supply | Qty: 90 | Fill #0

## 2015-10-19 MED FILL — ALPRAZolam 0.5 MG TABS: 0.5 | 20 days supply | Qty: 60 | Fill #0

## 2015-10-27 DIAGNOSIS — M5136 Other intervertebral disc degeneration, lumbar region: Secondary | ICD-10-CM | POA: Diagnosis not present

## 2015-10-27 DIAGNOSIS — M4806 Spinal stenosis, lumbar region: Secondary | ICD-10-CM | POA: Diagnosis not present

## 2015-10-27 DIAGNOSIS — M4726 Other spondylosis with radiculopathy, lumbar region: Secondary | ICD-10-CM | POA: Diagnosis not present

## 2015-10-27 DIAGNOSIS — M5416 Radiculopathy, lumbar region: Secondary | ICD-10-CM | POA: Diagnosis not present

## 2015-10-28 MED FILL — OXYBUTYNIN CL ER 10 MG TAB: 10 | 30 days supply | Qty: 30 | Fill #8

## 2015-11-03 ENCOUNTER — Telehealth: Payer: Self-pay | Admitting: Cardiovascular Disease

## 2015-11-03 MED ORDER — METOPROLOL TARTRATE 25 MG PO TABS: 12.5000 mg | ORAL_TABLET | Freq: Two times a day (BID) | ORAL | Status: DC

## 2015-11-03 MED FILL — METOPROLOL TARTRATE 25 MG T: 25 | 90 days supply | Qty: 90 | Fill #0

## 2015-11-03 NOTE — Telephone Encounter (Signed)
Spoke with patient who states her pulse is running lower than normal; states pulse usually low 70's bpm recently it has been in the 50's bpm range.  States she has less energy over the last 1-2 weeks and she wonders if she needs lower dose of medication.  States on one occasion she noticed her heart skipping beats but otherwise she feels that her heart rate is regular.  States she fell on 2 separate occasions - sounds like on both occasions she missed a step.  She denies dizziness or light-headedness or feeling like she would pass out.  States BP is typically systolic of [id number] to [id number] mmHg and diastolic [id number] to 99991111 mmHg.  I verified that current Lopressor dose is 25 mg twice daily not 12.5 mg.  She has an appointment with Dr. Acie Fredrickson on 7/20 and would like to try decreasing dose of lopressor to 12.5 mg BID until that visit. I advised that Dr. Acie Fredrickson, who is in the office, is in agreement with this plan. I advised her to call back if needed prior to next office visit.  She verbalized understanding and agreement.

## 2015-11-03 NOTE — Telephone Encounter (Signed)
New Message  Pt is getting ready to refill Lopresser/Metoprolol 12 1/2mg    Lostartin 150 mg    Pt c/o medication issue:  1. Name of Medication: Lopresser/Metoprolol 12 1/2mg  & Lostartin 150 mg   2. How are you currently taking this medication (dosage and times per day)? Once and twice  3. Are you having a reaction (difficulty breathing--STAT)? no  4. What is your medication issue?  HR lest than 55-60 usually in the 70  She wants to know if she needs to refill it or if a change is needed, or if she needs to take half of each   pt verbalized no chest pain, she feels drained, not a lot of energy throughout the day

## 2015-11-15 MED FILL — ALPRAZolam 0.5 MG TABS: 0.5 | 20 days supply | Qty: 60 | Fill #1

## 2015-11-17 ENCOUNTER — Ambulatory Visit (INDEPENDENT_AMBULATORY_CARE_PROVIDER_SITE_OTHER): Payer: 59 | Admitting: Cardiovascular Disease

## 2015-11-17 ENCOUNTER — Encounter: Payer: Self-pay | Admitting: Cardiovascular Disease

## 2015-11-17 ENCOUNTER — Other Ambulatory Visit (INDEPENDENT_AMBULATORY_CARE_PROVIDER_SITE_OTHER): Payer: 59 | Admitting: *Deleted

## 2015-11-17 ENCOUNTER — Encounter (INDEPENDENT_AMBULATORY_CARE_PROVIDER_SITE_OTHER): Payer: Self-pay

## 2015-11-17 VITALS — BP 110/70 | HR 69 | Ht 65.0 in | Wt 157.8 lb

## 2015-11-17 DIAGNOSIS — I10 Essential (primary) hypertension: Secondary | ICD-10-CM | POA: Diagnosis not present

## 2015-11-17 DIAGNOSIS — I2510 Atherosclerotic heart disease of native coronary artery without angina pectoris: Secondary | ICD-10-CM | POA: Diagnosis not present

## 2015-11-17 DIAGNOSIS — E785 Hyperlipidemia, unspecified: Secondary | ICD-10-CM | POA: Diagnosis not present

## 2015-11-17 LAB — LIPID PANEL
CHOL/HDL RATIO: 2.1 ratio (ref <=5.0)
Cholesterol: 165 mg/dL (ref 125–200)
HDL: 80 mg/dL (ref >=46)
LDL Cholesterol: 73 mg/dL (ref <130)
TRIGLYCERIDES: 62 mg/dL (ref <150)
VLDL: 12 mg/dL (ref <30)

## 2015-11-17 LAB — COMPREHENSIVE METABOLIC PANEL
ALK PHOS: 71 U/L (ref 33–130)
ALT: 21 U/L (ref 6–29)
AST: 19 U/L (ref 10–35)
Albumin: 4.1 g/dL (ref 3.6–5.1)
BILIRUBIN TOTAL: 0.9 mg/dL (ref 0.2–1.2)
BUN: 14 mg/dL (ref 7–25)
CO2: 27 mmol/L (ref 20–31)
CREATININE: 0.77 mg/dL (ref 0.50–0.99)
Calcium: 8.6 mg/dL (ref 8.6–10.4)
Chloride: 101 mmol/L (ref 98–110)
GLUCOSE: 88 mg/dL (ref 65–99)
Potassium: 3.9 mmol/L (ref 3.5–5.3)
SODIUM: 139 mmol/L (ref 135–146)
Total Protein: 6.1 g/dL (ref 6.1–8.1)

## 2015-11-17 NOTE — Progress Notes (Signed)
Sarah Ballard Date of Birth  1956/01/01 [facility] [address]    [place], [place]  [address] [phone]  Fax  [phone]  Problem List 1. Coronary artery disease-  2. anxiety 3. Hyperlipidemia 4. Hypertension 5. Hypothyroidism 6. Chronic back pain  History of Present Illness:  The 60 yo  female with a history of coronary artery disease. She's status post PTCA and stenting of her left anterior descending artery. She also had Cutting Balloon procedure and reexpansion of that stent in January 2010.  She was admitted recently to the hospital for chest pain an dizziness and cath revealed no significant irregularities.  He is walking twice a week. She's not having episodes of angina with walking. 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  October 27, 2012:  Sarah Ballard is doing well from a cardiac standpoint.   She is having some back pain - needs to have a back injection but does not want to stop the Plavix.    October 27, 2013:  Sarah Ballard is doing well.  She denies any chest pain.  Bumping into lots of things and has bruising on her legs.   Her potassium was low at her last check.    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  Asked about stopping the plavix   Jan. 24, 2017:  Sarah Ballard is doing ok Has been losing weight.   Lots of stress with taking care of her father and her divorce and her son .  No CP .     November 17, 2015:  Doing ok.  Sleepy .  Had to work late last night . No CP,  Breathing is good Lots of anxiety with work stress.      Current Outpatient Prescriptions on File Prior to Visit  Medication Sig Dispense Refill  . acetaminophen (TYLENOL) 500 MG tablet Take 500 mg by mouth every 6 (six) hours as needed for headache (and pain).     . Calcium Carbonate-Vitamin D (CALTRATE 600+D) 600-400 MG-UNIT per  tablet Take 1 tablet by mouth daily as needed for heartburn.     . clopidogrel (PLAVIX) 75 MG tablet TAKE 1 TABLET BY MOUTH DAILY 90 tablet 3  . cyclobenzaprine (FLEXERIL) 10 MG tablet Take 10 mg by mouth as needed for muscle spasms.     Marland Kitchen docusate sodium (COLACE) 100 MG capsule Take 100 mg by mouth as needed (for constipation).     Marland Kitchen escitalopram (LEXAPRO) 20 MG tablet Take 1 tablet by mouth daily. Reported on 10/11/2015  3  . estradiol (ESTRACE) 0.1 MG/GM vaginal cream Pea size amount to urethra 3 times a week at HS 42.5 g 3  . glucosamine-chondroitin 500-400 MG tablet Take 1 tablet by mouth daily.     . Hydrocodone-Acetaminophen (VICODIN PO) Take by mouth as needed (for pain). Pt unsure of dose    . ibuprofen (ADVIL,MOTRIN) 200 MG tablet Take 600 mg by mouth every 6 (six) hours as needed. For pain    . irbesartan (AVAPRO) 150 MG tablet Take 1 tablet (150 mg total) by mouth daily. 90 tablet 3  . levothyroxine (SYNTHROID, LEVOTHROID) 125 MCG tablet Take 125 mcg by mouth daily.     . magnesium 30 MG tablet Take 30 mg by mouth 2 (two) times daily. Reported on 07/08/2015    .  metoprolol (LOPRESSOR) 25 MG tablet Take 0.5 tablets (12.5 mg total) by mouth 2 (two) times daily. 30 tablet 11  . nitroGLYCERIN (NITROSTAT) 0.4 MG SL tablet Place 1 tablet (0.4 mg total) under the tongue every 5 (five) minutes as needed. For chest pain 25 tablet 6  . omega-3 acid ethyl esters (LOVAZA) 1 G capsule Take 2 g by mouth 2 (two) times daily.      Marland Kitchen omeprazole (PRILOSEC OTC) 20 MG tablet Take 20 mg by mouth daily. With dinner    . oxybutynin (DITROPAN-XL) 10 MG 24 hr tablet Take 10 mg by mouth daily.  11  . polyethylene glycol (MIRALAX / GLYCOLAX) packet Take 17 g by mouth daily as needed. For constipation    . rosuvastatin (CRESTOR) 20 MG tablet Take 1 tablet (20 mg total) by mouth daily. 90 tablet 2  . Wheat Dextrin (BENEFIBER PO) Take by mouth daily. Pt unsure of dosage     No current facility-administered  medications on file prior to visit.    Allergies  Allergen Reactions  . Latex     itching  . Lisinopril Cough  . Tramadol Hcl     REACTION: nausea, vomiting  . Ultram [Tramadol]     Nausea and vomitting     Past Medical History  Diagnosis Date  . Hypothyroidism   . Hyperlipemia   . Hypertension   . Anxiety   . Degenerative disc disease   . CAD (coronary artery disease) 2005    s/p PCI LAD and cutting balloon angioplasty  with stent reexpansion 04/2008  . Angina pectoris ([place]) 2005    with stent placement Anterior decscending  . Positive PPD     neg. CXR, treated with INH for 1 yr.  about 82  . MI (myocardial infarction) ([place]) 2005    angio with stent  . Hypercholesterolemia   . Bone spur     Right Hip  . Arthritis   . Colon polyps   . Depression   . GERD (gastroesophageal reflux disease)   . UTI (urinary tract infection) 05/31/15    Past Surgical History  Procedure Laterality Date  . Coronary angioplasty  2009  . Nerve root block      epidural steroids; multiple  . Coronary angioplasty with stent placement  2005  . Tubal ligation    . Tonsillectomy    . Laparoscopy      for endometriosis  . Ruptured disc  2007    with epidural   . Left heart catheterization with coronary angiogram Bilateral 06/27/2011    Procedure: LEFT HEART CATHETERIZATION WITH CORONARY ANGIOGRAM;  Surgeon: Burnell Blanks, MD;  Location[facility] C[facility] LAB;  Service: Cardiovascular;  Laterality: Bilateral;  . Tongue biopsy      keratin accumulation - Benign   . Colonoscopy    . Polypectomy    . Hemorrhoid surgery  06/2015    Removal   . Lumbar epidural injection      many    History  Smoking status  . Never Smoker   Smokeless tobacco  . Never Used    History  Alcohol Use  . 0.6 oz/week  . 1 Glasses of wine per week    Comment: occ wine    Family History  Problem Relation Age of Onset  . Colon cancer Mother 60  . Stroke Mother   . Hypertension Mother   . Diabetes  Mother   . Rheum arthritis Mother   . Heart attack Father  x2  . Diabetes Father   . Kidney disease Father   . Stroke Maternal Grandmother   . Esophageal cancer Cousin 71  . Atrial fibrillation Mother   . Colon cancer Paternal Grandmother   . Diabetes Paternal Grandmother   . Heart disease Father     Reviw of Systems:  Reviewed in the HPI.  All other systems are negative.  Physical Exam: BP 110/70 mmHg  Pulse 69  Ht 5\' 5"  (1.651 m)  Wt 157 lb 12.8 oz (71.578 kg)  BMI 26.26 kg/m2  SpO2 98%  LMP 06/29/2006 The patient is alert and oriented x 3.  The mood and affect are normal.   Skin: warm and dry.  Color is normal.    HEENT:   the sclera are nonicteric.  The mucous membranes are moist.  The carotids are 2+ without bruits.  There is no thyromegaly.  There is no JVD.   Lungs: clear.  The chest wall is non tender.   Heart: regular rate with a normal S1 and S2.  There are no murmurs, gallops, or rubs. The PMI is not displaced.    Abdomen: good bowel sounds.  There is no guarding or rebound.  There is no hepatosplenomegaly or tenderness.  There are no masses.  Extremities:  no clubbing, cyanosis, or edema.  The legs are without rashes.    Her right radial cath site is well-healed. There is a small scar associated with her site. Her pulses are intact. Neuro:  Cranial nerves II - XII are intact.  Motor and sensory functions are intact.   The gait is normal.  ECG :  - November 17, 2015:  Normal sinus rhythm at 69    Assessment / Plan:   1. Coronary artery disease-  she's doing very well.  No angina.  Bruising has improved since she stopped her ASA.  Still on Plavix  . 2. anxiety 3. Hyperlipidemia -  we'll check fasting labs today  , continue meds.  4. Hypertension  - blood pressure is  okay. 5. Hypothyroidism 6. Chronic back pain    Mertie Moores, MD  11/17/2015 9:32 AM    [facility] [place],  [place], [place]  [address] Pager [phone] Phone: [phone]; Fax: [phone]

## 2015-11-17 NOTE — Patient Instructions (Signed)
Medication Instructions:  Your physician recommends that you continue on your current medications as directed. Please refer to the Current Medication list given to you today.   Labwork: TODAY - cholesterol, complete metabolic panel   Testing/Procedures: None Ordered   Follow-Up: Your physician wants you to follow-up in: 6 months with Dr. Nahser.  You will receive a reminder letter in the mail two months in advance. If you don't receive a letter, please call our office to schedule the follow-up appointment.   If you need a refill on your cardiac medications before your next appointment, please call your pharmacy.   Thank you for choosing [facility]! Darivs Lunden, RN [phone]    

## 2015-11-22 MED FILL — NITROGLYCERIN 0.4 MG TAB SL: 0.4 | 13 days supply | Qty: 25 | Fill #2

## 2015-11-22 MED FILL — ESCITALOPRAM 20 MG TABLET: 20 | 90 days supply | Qty: 90 | Fill #1

## 2015-11-22 MED FILL — OXYBUTYNIN CL ER 10 MG TAB: 10 | 30 days supply | Qty: 30 | Fill #9

## 2015-11-24 ENCOUNTER — Other Ambulatory Visit: Payer: Self-pay

## 2015-11-24 MED ORDER — IRBESARTAN 150 MG PO TABS: 150.0000 mg | ORAL_TABLET | Freq: Every day | ORAL | 1 refills | Status: DC

## 2015-11-24 MED FILL — IRBESARTAN 150 MG TABLET: 150 | 90 days supply | Qty: 90 | Fill #0

## 2015-11-29 ENCOUNTER — Ambulatory Visit (INDEPENDENT_AMBULATORY_CARE_PROVIDER_SITE_OTHER): Payer: 59 | Admitting: Gastroenterology

## 2015-11-29 ENCOUNTER — Encounter: Payer: Self-pay | Admitting: Gastroenterology

## 2015-11-29 DIAGNOSIS — K641 Second degree hemorrhoids: Secondary | ICD-10-CM

## 2015-11-29 NOTE — Progress Notes (Signed)
PROCEDURE NOTE: The patient presents with symptomatic grade II  hemorrhoids, requesting rubber band ligation of his/her hemorrhoidal disease.  All risks, benefits and alternative forms of therapy were described and informed consent was obtained.   The anorectum was pre-medicated with 0.125% nitroglycerine The decision was made to band the Right posterior and left lateral internal hemorrhoid, and the [place] was used to perform band ligation without complication.  Digital anorectal examination was then performed to assure proper positioning of the band, and to adjust the banded tissue as required.  The patient was discharged home without pain or other issues.  Dietary and behavioral recommendations were given and along with follow-up instructions.     Restart Plavix in 3 days, on August 4th, 2017 Continue Benefiber  The patient will return for  follow-up and possible additional banding as required. No complications were encountered and the patient tolerated the procedure well.  Damaris Hippo , MD [phone] Mon-Fri 8a-5p [phone] after 5p, weekends, holidays

## 2015-11-29 NOTE — Patient Instructions (Signed)

## 2015-12-16 MED FILL — ALPRAZolam 0.5 MG TABS: 0.5 | 20 days supply | Qty: 60 | Fill #2

## 2015-12-16 MED FILL — OMEGA-3 ETHYL ESTERS 1 GM C: 1 | 90 days supply | Qty: 360 | Fill #1

## 2015-12-16 MED FILL — ROSUVASTATIN CALCIUM 20 MG: 20 | 90 days supply | Qty: 90 | Fill #1

## 2015-12-20 DIAGNOSIS — M1831 Unilateral post-traumatic osteoarthritis of first carpometacarpal joint, right hand: Secondary | ICD-10-CM | POA: Diagnosis not present

## 2015-12-23 MED FILL — OXYBUTYNIN CL ER 10 MG TAB: 10 | 30 days supply | Qty: 30 | Fill #10

## 2016-01-06 ENCOUNTER — Other Ambulatory Visit: Payer: Self-pay | Admitting: Cardiovascular Disease

## 2016-01-06 MED FILL — CLOPIDOGREL 75 MG TABLET: 75 | 90 days supply | Qty: 90 | Fill #0

## 2016-01-17 DIAGNOSIS — M1990 Unspecified osteoarthritis, unspecified site: Secondary | ICD-10-CM | POA: Diagnosis not present

## 2016-01-17 DIAGNOSIS — D692 Other nonthrombocytopenic purpura: Secondary | ICD-10-CM | POA: Diagnosis not present

## 2016-01-17 DIAGNOSIS — E038 Other specified hypothyroidism: Secondary | ICD-10-CM | POA: Diagnosis not present

## 2016-01-17 DIAGNOSIS — Z6827 Body mass index (BMI) 27.0-27.9, adult: Secondary | ICD-10-CM | POA: Diagnosis not present

## 2016-01-17 DIAGNOSIS — E784 Other hyperlipidemia: Secondary | ICD-10-CM | POA: Diagnosis not present

## 2016-01-17 DIAGNOSIS — I2510 Atherosclerotic heart disease of native coronary artery without angina pectoris: Secondary | ICD-10-CM | POA: Diagnosis not present

## 2016-01-17 DIAGNOSIS — I10 Essential (primary) hypertension: Secondary | ICD-10-CM | POA: Diagnosis not present

## 2016-01-17 DIAGNOSIS — F329 Major depressive disorder, single episode, unspecified: Secondary | ICD-10-CM | POA: Diagnosis not present

## 2016-01-17 DIAGNOSIS — E668 Other obesity: Secondary | ICD-10-CM | POA: Diagnosis not present

## 2016-01-17 MED FILL — NITROGLYCERIN 0.4 MG TAB SL: 0.4 | 30 days supply | Qty: 25 | Fill #0

## 2016-01-17 MED FILL — CYCLOBENZAPRINE 10 MG TAB: 10 | 10 days supply | Qty: 30 | Fill #0

## 2016-01-19 MED FILL — HYDROCODON-APAP 5-325: 5-325 | 20 days supply | Qty: 40 | Fill #0

## 2016-01-23 MED FILL — ALPRAZolam 0.5 MG TABS: 0.5 | 20 days supply | Qty: 60 | Fill #3

## 2016-01-26 MED FILL — OXYBUTYNIN CL ER 10 MG TAB: 10 | 30 days supply | Qty: 30 | Fill #11

## 2016-01-26 MED FILL — METOPROLOL TARTRATE 25 MG T: 25 | 90 days supply | Qty: 90 | Fill #1

## 2016-02-06 ENCOUNTER — Encounter: Payer: Self-pay | Admitting: Gastroenterology

## 2016-02-10 ENCOUNTER — Encounter: Payer: Self-pay | Admitting: Nurse Practitioner

## 2016-02-22 MED FILL — OXYBUTYNIN CL ER 10 MG TAB: 10 | 30 days supply | Qty: 30 | Fill #0

## 2016-02-22 MED FILL — ALPRAZolam 0.5 MG TABS: 0.5 | 20 days supply | Qty: 60 | Fill #0

## 2016-02-22 MED FILL — IRBESARTAN 150 MG TABLET: 150 | 90 days supply | Qty: 90 | Fill #1

## 2016-02-23 DIAGNOSIS — Z1231 Encounter for screening mammogram for malignant neoplasm of breast: Secondary | ICD-10-CM | POA: Diagnosis not present

## 2016-02-24 DIAGNOSIS — N3946 Mixed incontinence: Secondary | ICD-10-CM | POA: Diagnosis not present

## 2016-03-12 MED FILL — FLUTICASONE PROP 50 MCG SPR: 50 | 90 days supply | Qty: 48 | Fill #1

## 2016-03-12 MED FILL — OMEGA-3 ETHYL ESTERS 1 GM C: 1 | 90 days supply | Qty: 360 | Fill #2

## 2016-03-12 MED FILL — LEVOTHYROXINE 125 MCG TAB: 125 | 90 days supply | Qty: 90 | Fill #1

## 2016-03-26 MED FILL — ALPRAZolam 0.5 MG TABS: 0.5 | 20 days supply | Qty: 60 | Fill #1

## 2016-03-26 MED FILL — ROSUVASTATIN CALCIUM 20 MG: 20 | 90 days supply | Qty: 90 | Fill #2

## 2016-03-26 MED FILL — OXYBUTYNIN CL ER 10 MG TAB: 10 | 30 days supply | Qty: 30 | Fill #1

## 2016-03-26 MED FILL — ESCITALOPRAM 20 MG TABLET: 20 | 90 days supply | Qty: 90 | Fill #2

## 2016-04-24 MED FILL — AMOXICILLIN 500 MG CAPSULE: 500 | 7 days supply | Qty: 21 | Fill #0

## 2016-04-24 MED FILL — CLOPIDOGREL 75 MG TABLET: 75 | 90 days supply | Qty: 90 | Fill #1

## 2016-04-24 MED FILL — ALPRAZolam 0.5 MG TABS: 0.5 | 20 days supply | Qty: 60 | Fill #2

## 2016-04-24 MED FILL — OXYBUTYNIN CL ER 10 MG TAB: 10 | 30 days supply | Qty: 30 | Fill #2

## 2016-05-15 DIAGNOSIS — H52223 Regular astigmatism, bilateral: Secondary | ICD-10-CM | POA: Diagnosis not present

## 2016-05-15 DIAGNOSIS — H5213 Myopia, bilateral: Secondary | ICD-10-CM | POA: Diagnosis not present

## 2016-05-15 DIAGNOSIS — H524 Presbyopia: Secondary | ICD-10-CM | POA: Diagnosis not present

## 2016-05-18 MED FILL — CYCLOBENZAPRINE 10 MG TAB: 10 | 10 days supply | Qty: 30 | Fill #1

## 2016-05-18 MED FILL — METOPROLOL TARTRATE 25 MG T: 25 | 90 days supply | Qty: 90 | Fill #2

## 2016-05-22 ENCOUNTER — Ambulatory Visit: Payer: 59 | Admitting: Cardiovascular Disease

## 2016-05-28 ENCOUNTER — Other Ambulatory Visit: Payer: Self-pay | Admitting: Cardiovascular Disease

## 2016-05-28 MED FILL — OXYBUTYNIN CL ER 10 MG TAB: 10 | 30 days supply | Qty: 30 | Fill #3

## 2016-05-28 MED FILL — ALPRAZolam 0.5 MG TABS: 0.5 | 20 days supply | Qty: 60 | Fill #0

## 2016-05-29 MED FILL — IRBESARTAN 150 MG TABLET: 150 | 90 days supply | Qty: 90 | Fill #0

## 2016-06-18 DIAGNOSIS — M1831 Unilateral post-traumatic osteoarthritis of first carpometacarpal joint, right hand: Secondary | ICD-10-CM | POA: Diagnosis not present

## 2016-06-26 MED FILL — OXYBUTYNIN CL ER 10 MG TAB: 10 | 30 days supply | Qty: 30 | Fill #4

## 2016-06-26 MED FILL — OMEGA-3 ETHYL ESTERS 1 GM C: 1 | 90 days supply | Qty: 360 | Fill #3

## 2016-06-26 MED FILL — ALPRAZolam 0.5 MG TABS: 0.5 | 20 days supply | Qty: 60 | Fill #1

## 2016-07-10 DIAGNOSIS — E038 Other specified hypothyroidism: Secondary | ICD-10-CM | POA: Diagnosis not present

## 2016-07-10 DIAGNOSIS — I10 Essential (primary) hypertension: Secondary | ICD-10-CM | POA: Diagnosis not present

## 2016-07-10 MED FILL — LEVOTHYROXINE 125 MCG TABLE: 125 | 90 days supply | Qty: 90 | Fill #2

## 2016-07-12 ENCOUNTER — Encounter: Payer: Self-pay | Admitting: Cardiovascular Disease

## 2016-07-12 ENCOUNTER — Ambulatory Visit (INDEPENDENT_AMBULATORY_CARE_PROVIDER_SITE_OTHER): Payer: Self-pay | Admitting: Cardiovascular Disease

## 2016-07-12 VITALS — BP 100/80 | HR 64 | Ht 65.75 in | Wt 174.8 lb

## 2016-07-12 DIAGNOSIS — I10 Essential (primary) hypertension: Secondary | ICD-10-CM

## 2016-07-12 DIAGNOSIS — I2510 Atherosclerotic heart disease of native coronary artery without angina pectoris: Secondary | ICD-10-CM

## 2016-07-12 LAB — COMPREHENSIVE METABOLIC PANEL
A/G RATIO: 2.1 (ref 1.2–2.2)
ALT: 27 IU/L (ref 0–32)
AST: 27 IU/L (ref 0–40)
Albumin: 4.1 g/dL (ref 3.6–4.8)
Alkaline Phosphatase: 73 IU/L (ref 39–117)
BILIRUBIN TOTAL: 0.4 mg/dL (ref 0.0–1.2)
BUN/Creatinine Ratio: 13 (ref 12–28)
BUN: 10 mg/dL (ref 8–27)
CALCIUM: 8.8 mg/dL (ref 8.7–10.3)
CHLORIDE: 100 mmol/L (ref 96–106)
CO2: 25 mmol/L (ref 18–29)
Creatinine, Ser: 0.75 mg/dL (ref 0.57–1.00)
GFR, EST AFRICAN AMERICAN: 99 mL/min/{1.73_m2} (ref >59)
GFR, EST NON AFRICAN AMERICAN: 86 mL/min/{1.73_m2} (ref >59)
GLOBULIN, TOTAL: 2 g/dL (ref 1.5–4.5)
Glucose: 90 mg/dL (ref 65–99)
Potassium: 4.1 mmol/L (ref 3.5–5.2)
SODIUM: 140 mmol/L (ref 134–144)
Total Protein: 6.1 g/dL (ref 6.0–8.5)

## 2016-07-12 LAB — LIPID PANEL
CHOLESTEROL TOTAL: 167 mg/dL (ref 100–199)
Chol/HDL Ratio: 2.8 ratio units (ref 0.0–4.4)
HDL: 59 mg/dL (ref >39)
LDL CALC: 83 mg/dL (ref 0–99)
TRIGLYCERIDES: 123 mg/dL (ref 0–149)
VLDL Cholesterol Cal: 25 mg/dL (ref 5–40)

## 2016-07-12 MED ORDER — ROSUVASTATIN CALCIUM 20 MG PO TABS: 20.0000 mg | ORAL_TABLET | Freq: Every day | ORAL | 3 refills | Status: DC

## 2016-07-12 MED ORDER — NITROGLYCERIN 0.4 MG SL SUBL: 0.4000 mg | SUBLINGUAL_TABLET | SUBLINGUAL | 6 refills | Status: DC | PRN

## 2016-07-12 MED FILL — ROSUVASTATIN CALCIUM 20 MG: 20 | 90 days supply | Qty: 90 | Fill #0

## 2016-07-12 MED FILL — NITROGLYCERIN 0.4 MG TAB SL: 0.4 | 5 days supply | Qty: 25 | Fill #0

## 2016-07-12 NOTE — Patient Instructions (Signed)
Medication Instructions:  Your physician recommends that you continue on your current medications as directed. Please refer to the Current Medication list given to you today.   Labwork: TODAY - cholesterol, complete metabolic panel   Testing/Procedures: None Ordered   Follow-Up: Your physician wants you to follow-up in: 6 months with Dr. Nahser.  You will receive a reminder letter in the mail two months in advance. If you don't receive a letter, please call our office to schedule the follow-up appointment.   If you need a refill on your cardiac medications before your next appointment, please call your pharmacy.   Thank you for choosing [facility]! Traevon Meiring, RN [phone]    

## 2016-07-12 NOTE — Progress Notes (Signed)
[name] Date of Birth  [birth year] [facility] [address]    [place],   [address] [phone]  Fax  [phone]  Problem List 1. Coronary artery disease-  2. anxiety 3. Hyperlipidemia 4. Hypertension 5. Hypothyroidism 6. Chronic back pain     The 61 yo  female with a history of coronary artery disease. She's status post PTCA and stenting of her left anterior descending artery. She also had Cutting Balloon procedure and reexpansion of that stent in January 2010.  She was admitted recently to the hospital for chest pain an dizziness and cath revealed no significant irregularities.  He is walking twice a week. She's not having episodes of angina with walking. 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  October 27, 2012:  Sarah Ballard is doing well from a cardiac standpoint.   She is having some back pain - needs to have a back injection but does not want to stop the Plavix.    October 27, 2013:  Sarah Ballard is doing well.  She denies any chest pain.  Bumping into lots of things and has bruising on her legs.   Her potassium was low at her last check.    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  Asked about stopping the plavix   Jan. 24, 2017:  Sarah Ballard is doing ok Has been losing weight.   Lots of stress with taking care of her father and her divorce and her son .  No CP .     November 17, 2015:  Doing ok.  Sleepy .  Had to work late last night . No CP,  Breathing is good Lots of anxiety with work stress.   July 12, 2016:  Doing ok from a cardiac standpoint Has gained some weight .  Rare episodes of CP - she thinks its due to stress or indigestion,   its not related to exertion  requests a refill of her NTG  Is trying to move  Is having lots of artheritis   Pain  Is falling lots at work   Current Outpatient  Prescriptions on File Prior to Visit  Medication Sig Dispense Refill  . acetaminophen (TYLENOL) 500 MG tablet Take 500 mg by mouth every 6 (six) hours as needed for headache (and pain).     Marland Kitchen ALPRAZolam (XANAX) 0.5 MG tablet Take 1-3 tablets by mouth daily as needed for anxiety and sleep    . Calcium Carbonate-Vitamin D (CALTRATE 600+D) 600-400 MG-UNIT per tablet Take 1 tablet by mouth daily as needed for heartburn.     . clopidogrel (PLAVIX) 75 MG tablet TAKE 1 TABLET BY MOUTH DAILY 90 tablet 3  . cyclobenzaprine (FLEXERIL) 10 MG tablet Take 10 mg by mouth as needed for muscle spasms.     Marland Kitchen docusate sodium (COLACE) 100 MG capsule Take 100 mg by mouth as needed (for constipation).     Marland Kitchen escitalopram (LEXAPRO) 20 MG tablet Take 1 tablet by mouth daily. Reported on 10/11/2015  3  . estradiol (ESTRACE) 0.1 MG/GM vaginal cream Pea size amount to urethra 3 times a week at HS 42.5 g 3  . fluticasone (FLONASE) 50 MCG/ACT nasal spray Place 2 sprays into both nostrils daily as needed for allergies or rhinitis.    Marland Kitchen glucosamine-chondroitin 500-400 MG tablet Take 1 tablet  by mouth daily.     . Hydrocodone-Acetaminophen (VICODIN PO) Take by mouth as needed (for pain). Pt unsure of dose    . ibuprofen (ADVIL,MOTRIN) 200 MG tablet Take 600 mg by mouth every 6 (six) hours as needed. For pain    . irbesartan (AVAPRO) 150 MG tablet TAKE 1 TABLET BY MOUTH DAILY. 90 tablet 1  . levothyroxine (SYNTHROID, LEVOTHROID) 125 MCG tablet Take 125 mcg by mouth daily.     . magnesium 30 MG tablet Take 30 mg by mouth 2 (two) times daily. Reported on 07/08/2015    . metoprolol (LOPRESSOR) 25 MG tablet Take 0.5 tablets (12.5 mg total) by mouth 2 (two) times daily. 30 tablet 11  . nitroGLYCERIN (NITROSTAT) 0.4 MG SL tablet Place 1 tablet (0.4 mg total) under the tongue every 5 (five) minutes as needed. For chest pain 25 tablet 6  . omega-3 acid ethyl esters (LOVAZA) 1 G capsule Take 2 g by mouth 2 (two) times daily.      Marland Kitchen  omeprazole (PRILOSEC OTC) 20 MG tablet Take 20 mg by mouth daily. With dinner    . oxybutynin (DITROPAN-XL) 10 MG 24 hr tablet Take 10 mg by mouth daily.  11  . polyethylene glycol (MIRALAX / GLYCOLAX) packet Take 17 g by mouth daily as needed. For constipation    . rosuvastatin (CRESTOR) 20 MG tablet Take 1 tablet (20 mg total) by mouth daily. 90 tablet 2  . Wheat Dextrin (BENEFIBER PO) Take by mouth daily. Pt unsure of dosage     No current facility-administered medications on file prior to visit.     Allergies  Allergen Reactions  . Latex     itching  . Lisinopril Cough  . Tramadol Hcl     REACTION: nausea, vomiting  . Ultram [Tramadol]     Nausea and vomitting     Past Medical History:  Diagnosis Date  . Angina pectoris ([place]) 2005   with stent placement Anterior decscending  . Anxiety   . Arthritis   . Bone spur    Right Hip  . CAD (coronary artery disease) 2005   s/p PCI LAD and cutting balloon angioplasty  with stent reexpansion 04/2008  . Colon polyps   . Degenerative disc disease   . Depression   . GERD (gastroesophageal reflux disease)   . Hypercholesterolemia   . Hyperlipemia   . Hypertension   . Hypothyroidism   . MI (myocardial infarction) 2005   angio with stent  . Positive PPD    neg. CXR, treated with INH for 1 yr.  about 69  . UTI (urinary tract infection) 05/31/15    Past Surgical History:  Procedure Laterality Date  . COLONOSCOPY    . CORONARY ANGIOPLASTY  2009  . CORONARY ANGIOPLASTY WITH STENT PLACEMENT  2005  . HEMORRHOID SURGERY  06/2015   Removal   . LAPAROSCOPY     for endometriosis  . LEFT HEART CATHETERIZATION WITH CORONARY ANGIOGRAM Bilateral 06/27/2011   Procedure: LEFT HEART CATHETERIZATION WITH CORONARY ANGIOGRAM;  Surgeon: Burnell Blanks, MD;  Location: [facility] CATH LAB;  Service: Cardiovascular;  Laterality: Bilateral;  . LUMBAR EPIDURAL INJECTION     many  . NERVE ROOT BLOCK     epidural steroids; multiple  . POLYPECTOMY     . ruptured disc  2007   with epidural   . TONGUE BIOPSY     keratin accumulation - Benign   . TONSILLECTOMY    . TUBAL LIGATION  History  Smoking Status  . Never Smoker  Smokeless Tobacco  . Never Used    History  Alcohol Use  . 0.6 oz/week  . 1 Glasses of wine per week    Comment: occ wine    Family History  Problem Relation Age of Onset  . Colon cancer Mother 29  . Stroke Mother   . Hypertension Mother   . Diabetes Mother   . Rheum arthritis Mother   . Atrial fibrillation Mother   . Heart attack Father     x2  . Diabetes Father   . Kidney disease Father   . Heart disease Father   . Stroke Maternal Grandmother   . Colon cancer Paternal Grandmother   . Diabetes Paternal Grandmother   . Esophageal cancer Cousin 42    Reviw of Systems:  Reviewed in the HPI.  All other systems are negative.  Physical Exam: BP 100/80 (BP Location: Right Arm, Patient Position: Sitting, Cuff Size: Normal)   Pulse 64   Ht 5' 5.75" (1.67 m)   Wt 174 lb 12.8 oz (79.3 kg)   LMP 06/29/2006   SpO2 95%   BMI 28.43 kg/m  The patient is alert and oriented x 3.  The mood and affect are normal.   Skin: warm and dry.  Color is normal.    HEENT:   the sclera are nonicteric.  The mucous membranes are moist.  The carotids are 2+ without bruits.  There is no thyromegaly.  There is no JVD.   Lungs: clear.  The chest wall is non tender.   Heart: regular rate with a normal S1 and S2.  There are no murmurs, gallops, or rubs. The PMI is not displaced.    Abdomen: good bowel sounds.  There is no guarding or rebound.  There is no hepatosplenomegaly or tenderness.  There are no masses.  Extremities:  no clubbing, cyanosis, or edema.  The legs are without rashes.    Her right radial cath site is well-healed. There is a small scar associated with her site. Her pulses are intact. Neuro:  Cranial nerves II - XII are intact.  Motor and sensory functions are intact.   The gait is normal.  ECG :  -  November 17, 2015:  Normal sinus rhythm at 69    Assessment / Plan:   1. Coronary artery disease-  she's doing very well.  No angina.  Bruising has improved since she stopped her ASA.  Still on Plavix  2. anxiety 3. Hyperlipidemia -  we'll check fasting labs today  , continue Crestor 20 mg a day .   4. Hypertension  - blood pressure is  okay.  5. Hypothyroidism 6. Chronic back pain 7. Weight gain :  Has gained some weight.   Advised watching her diet and exercising regularly  Body mass index is 28.43 kg/m.   Mertie Moores, MD  07/12/2016 9:41 AM    [address],  [place], [place]  [address] Pager [phone] Phone: [phone]; Fax: [phone]

## 2016-07-17 DIAGNOSIS — D698 Other specified hemorrhagic conditions: Secondary | ICD-10-CM | POA: Diagnosis not present

## 2016-07-17 DIAGNOSIS — I10 Essential (primary) hypertension: Secondary | ICD-10-CM | POA: Diagnosis not present

## 2016-07-17 DIAGNOSIS — I2510 Atherosclerotic heart disease of native coronary artery without angina pectoris: Secondary | ICD-10-CM | POA: Diagnosis not present

## 2016-07-17 DIAGNOSIS — E038 Other specified hypothyroidism: Secondary | ICD-10-CM | POA: Diagnosis not present

## 2016-07-17 DIAGNOSIS — Z0000 Encounter for general adult medical examination without abnormal findings: Secondary | ICD-10-CM | POA: Diagnosis not present

## 2016-07-17 DIAGNOSIS — Z1389 Encounter for screening for other disorder: Secondary | ICD-10-CM | POA: Diagnosis not present

## 2016-07-17 DIAGNOSIS — E784 Other hyperlipidemia: Secondary | ICD-10-CM | POA: Diagnosis not present

## 2016-07-17 DIAGNOSIS — F3289 Other specified depressive episodes: Secondary | ICD-10-CM | POA: Diagnosis not present

## 2016-07-17 DIAGNOSIS — K219 Gastro-esophageal reflux disease without esophagitis: Secondary | ICD-10-CM | POA: Diagnosis not present

## 2016-07-17 DIAGNOSIS — E010 Iodine-deficiency related diffuse (endemic) goiter: Secondary | ICD-10-CM | POA: Diagnosis not present

## 2016-07-17 MED FILL — HYDROCODON-APAP 5-325: 5-325 | 20 days supply | Qty: 40 | Fill #0

## 2016-07-24 DIAGNOSIS — M5416 Radiculopathy, lumbar region: Secondary | ICD-10-CM | POA: Diagnosis not present

## 2016-07-24 DIAGNOSIS — M545 Low back pain: Secondary | ICD-10-CM | POA: Diagnosis not present

## 2016-07-24 DIAGNOSIS — M7061 Trochanteric bursitis, right hip: Secondary | ICD-10-CM | POA: Diagnosis not present

## 2016-07-24 DIAGNOSIS — M7062 Trochanteric bursitis, left hip: Secondary | ICD-10-CM | POA: Diagnosis not present

## 2016-07-24 DIAGNOSIS — I10 Essential (primary) hypertension: Secondary | ICD-10-CM | POA: Diagnosis not present

## 2016-07-24 MED FILL — CLOPIDOGREL 75 MG TABLET: 75 | 90 days supply | Qty: 90 | Fill #2

## 2016-07-24 MED FILL — OXYBUTYNIN CL ER 10 MG TAB: 10 | 30 days supply | Qty: 30 | Fill #5

## 2016-07-25 DIAGNOSIS — Z1212 Encounter for screening for malignant neoplasm of rectum: Secondary | ICD-10-CM | POA: Diagnosis not present

## 2016-07-30 MED FILL — FLUTICASONE PROP 50 MCG SPR: 50 | 90 days supply | Qty: 48 | Fill #0

## 2016-07-30 MED FILL — ESCITALOPRAM 20 MG TABLET: 20 | 90 days supply | Qty: 90 | Fill #0

## 2016-07-30 MED FILL — ALPRAZolam 0.5 MG TABS: 0.5 | 20 days supply | Qty: 60 | Fill #2

## 2016-08-15 MED FILL — METOPROLOL TARTRATE 25 MG T: 25 | 90 days supply | Qty: 90 | Fill #3

## 2016-08-22 MED FILL — OXYBUTYNIN CL ER 10 MG TAB: 10 | 30 days supply | Qty: 30 | Fill #6

## 2016-08-22 MED FILL — IRBESARTAN 150 MG TABLET: 150 | 90 days supply | Qty: 90 | Fill #1

## 2016-08-27 IMAGING — DX DG HIP (WITH OR WITHOUT PELVIS) 2-3V*R*
3 series · 3 of 3 positions shown · non-contrast
Comparison: None.

CLINICAL DATA: Right hip pain after falling over a chair at work
and landing on her right side.

EXAM:
DG HIP (WITH OR WITHOUT PELVIS) 2-3V RIGHT

[t pelvis ap]
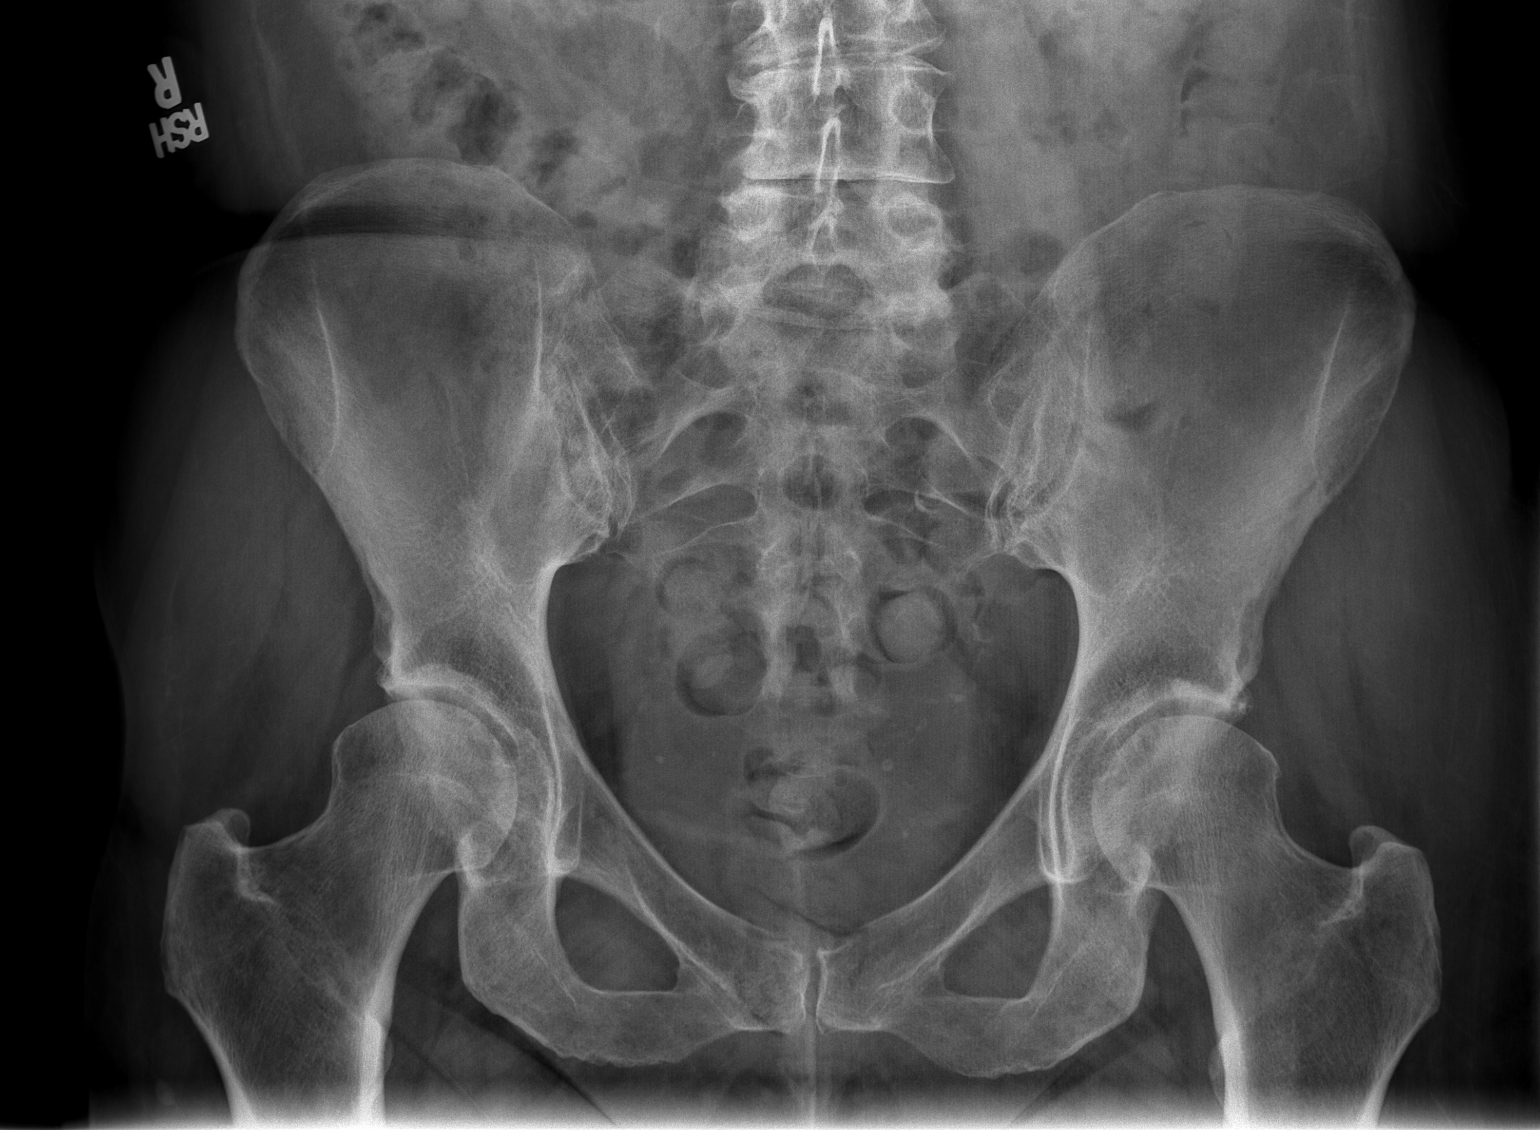

[t hip ap right]
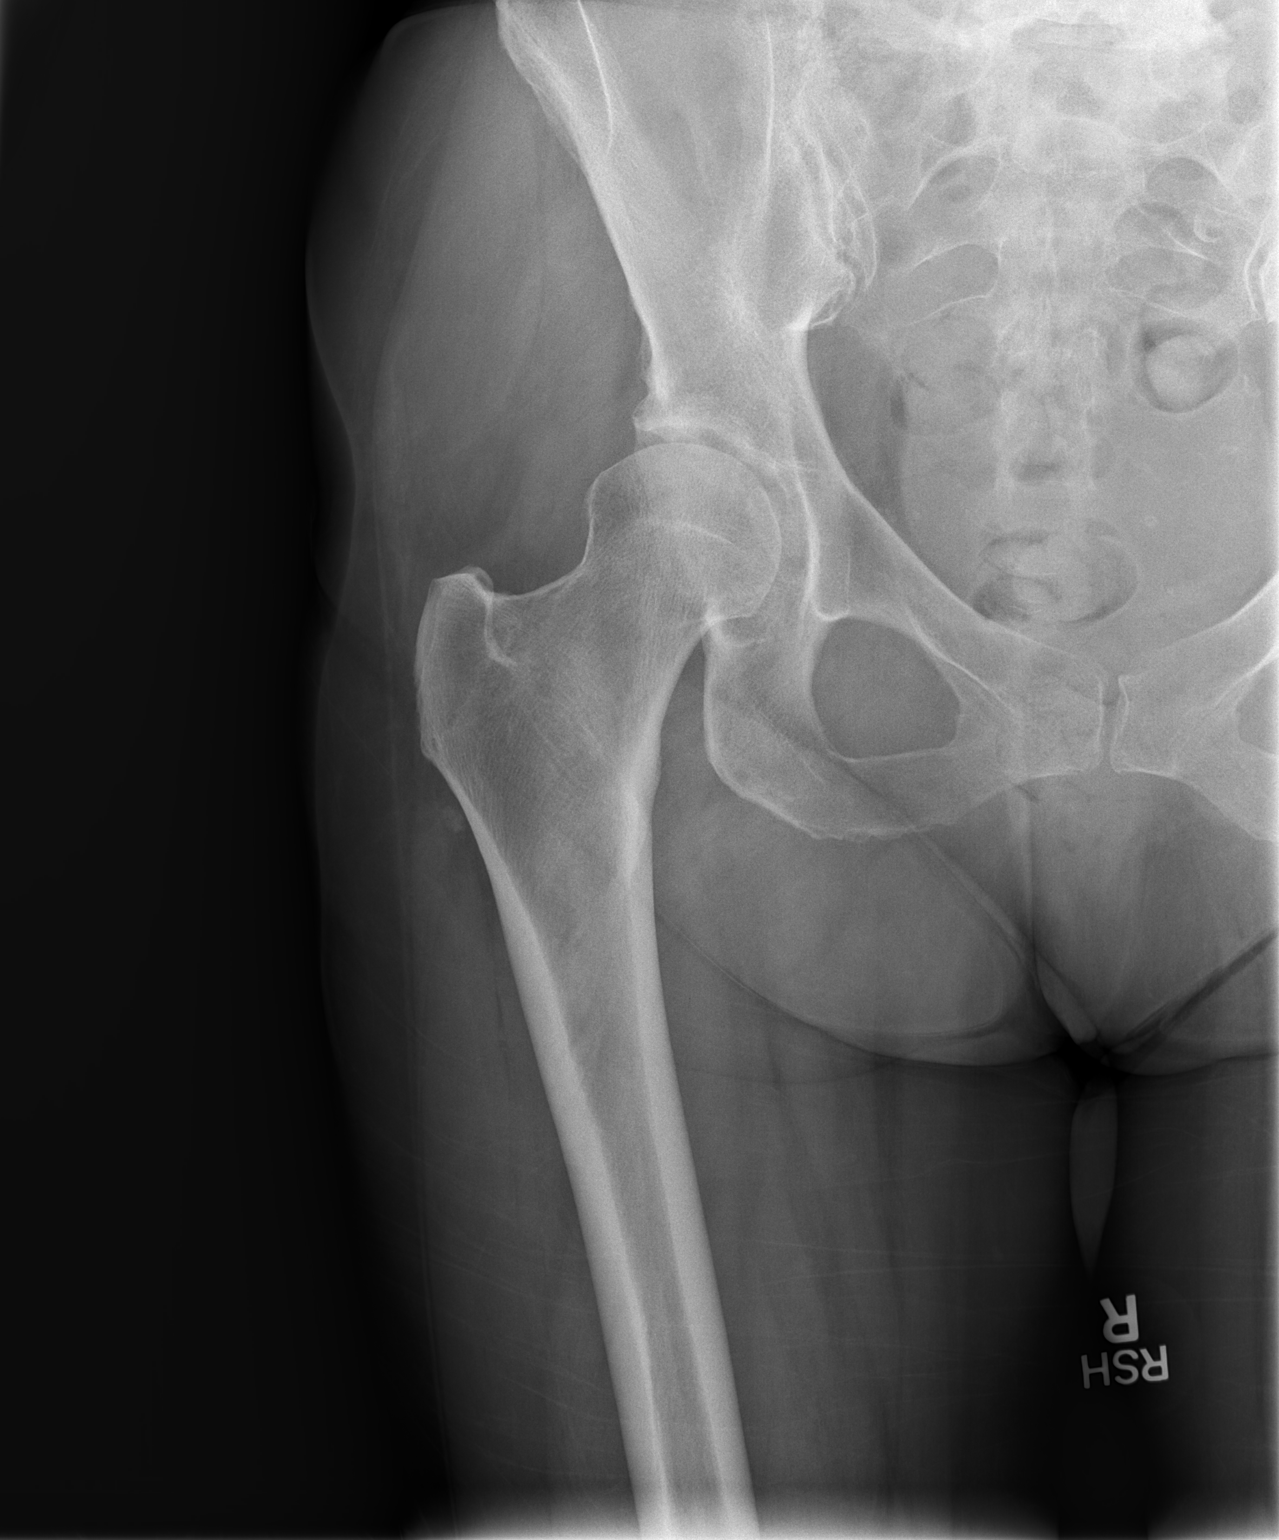

[t hip frog leg right]
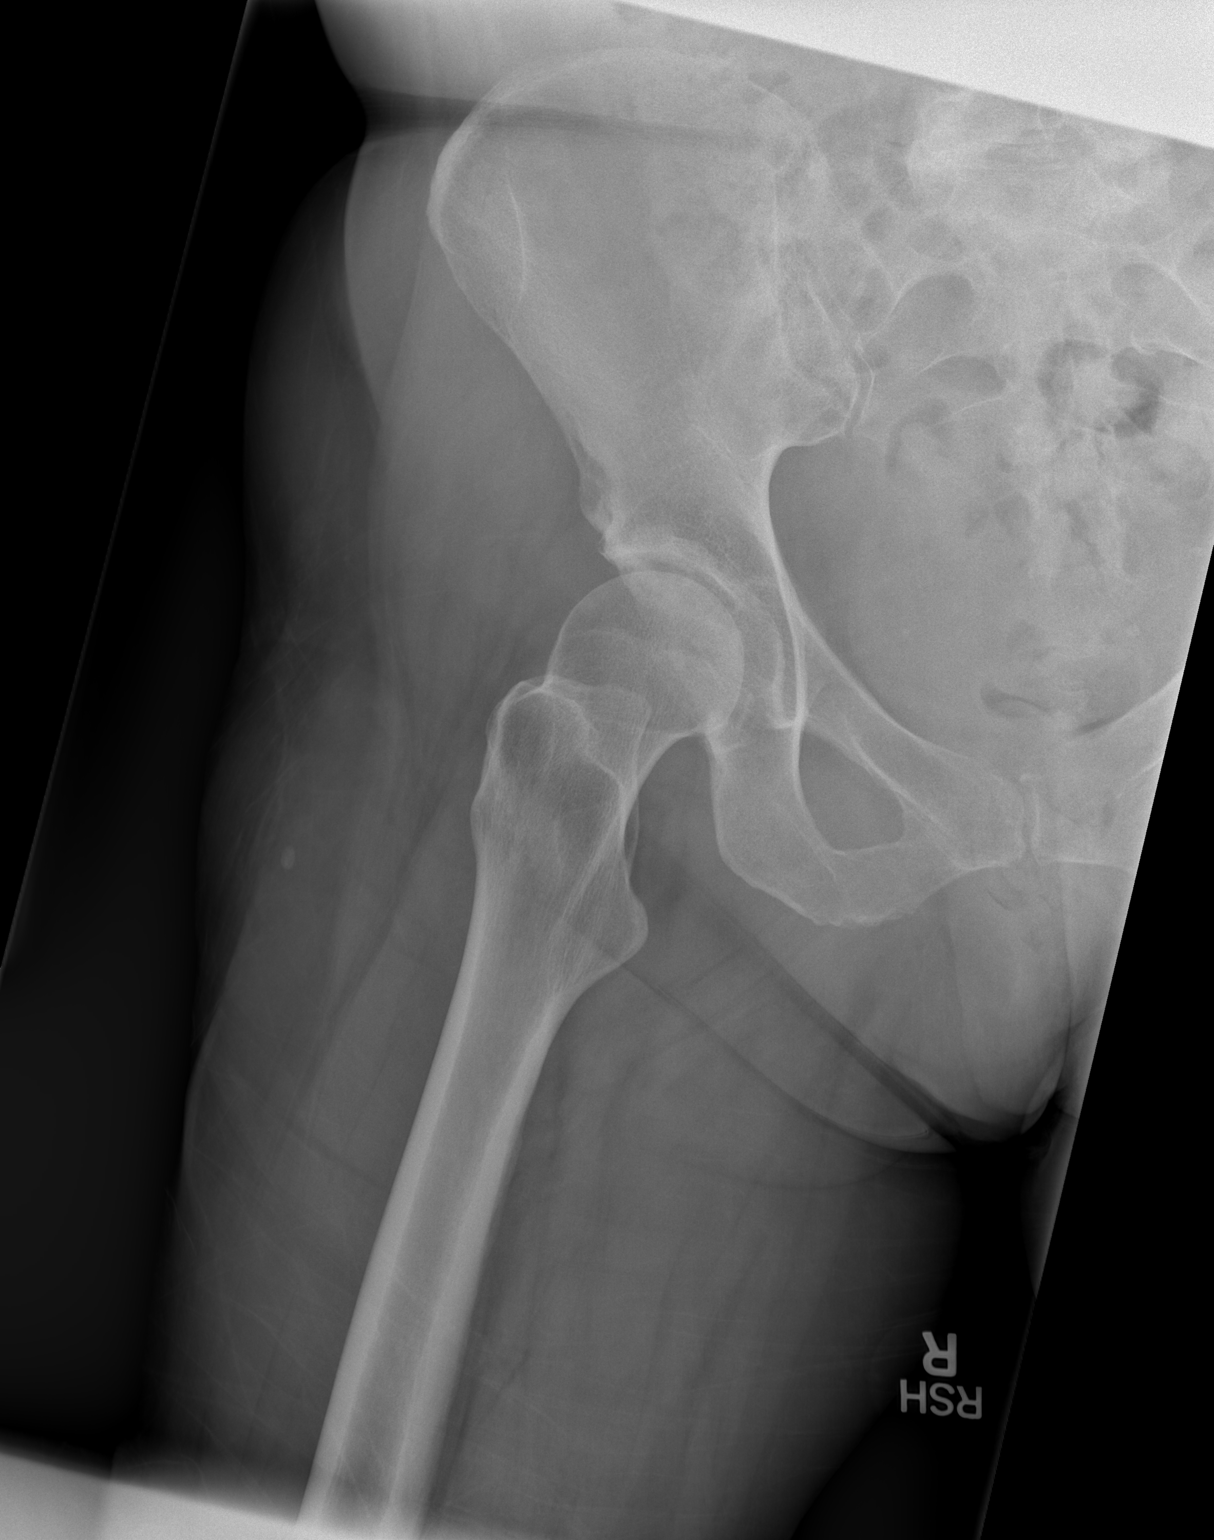

[3 of 3 positions shown; findings below may reference images not displayed]

FINDINGS: Normal appearing right hip without fracture or dislocation. Lower
lumbar spine degenerative changes, greater on the right. Minimal
left hip degenerative change.
IMPRESSION: 1. No fracture or dislocation.
2. Minimal left hip degenerative changes.
3. Lower lumbar spine degenerative changes.

## 2016-08-28 MED FILL — ALPRAZolam 0.5 MG TABS: 0.5 | 20 days supply | Qty: 60 | Fill #3

## 2016-09-25 MED FILL — OXYBUTYNIN CL ER 10 MG TAB: 10 | 30 days supply | Qty: 30 | Fill #7

## 2016-09-25 MED FILL — ALPRAZolam 0.5 MG TABS: 0.5 | 20 days supply | Qty: 60 | Fill #0

## 2016-09-25 MED FILL — OMEGA-3 ETHYL ESTERS 1 GM C: 1 | 90 days supply | Qty: 360 | Fill #0

## 2016-10-15 MED FILL — LEVOTHYROXINE 125 MCG TABLE: 125 | 90 days supply | Qty: 90 | Fill #0

## 2016-10-15 MED FILL — ROSUVASTATIN CALCIUM 20 MG: 20 | 90 days supply | Qty: 90 | Fill #1

## 2016-10-15 MED FILL — CLOPIDOGREL 75 MG TABLET: 75 | 90 days supply | Qty: 90 | Fill #3

## 2016-10-15 MED FILL — ALPRAZolam 0.5 MG TABS: 0.5 | 20 days supply | Qty: 60 | Fill #1

## 2016-10-16 ENCOUNTER — Ambulatory Visit (INDEPENDENT_AMBULATORY_CARE_PROVIDER_SITE_OTHER): Payer: 59 | Admitting: Nurse Practitioner

## 2016-10-16 ENCOUNTER — Encounter: Payer: Self-pay | Admitting: Nurse Practitioner

## 2016-10-16 VITALS — BP 110/74 | HR 72 | Ht 65.75 in | Wt 175.0 lb

## 2016-10-16 DIAGNOSIS — Z0000 Encounter for general adult medical examination without abnormal findings: Secondary | ICD-10-CM

## 2016-10-16 DIAGNOSIS — N812 Incomplete uterovaginal prolapse: Secondary | ICD-10-CM

## 2016-10-16 DIAGNOSIS — F439 Reaction to severe stress, unspecified: Secondary | ICD-10-CM

## 2016-10-16 DIAGNOSIS — N952 Postmenopausal atrophic vaginitis: Secondary | ICD-10-CM | POA: Diagnosis not present

## 2016-10-16 DIAGNOSIS — Z01411 Encounter for gynecological examination (general) (routine) with abnormal findings: Secondary | ICD-10-CM

## 2016-10-16 MED ORDER — ESTRADIOL 0.1 MG/GM VA CREA: TOPICAL_CREAM | VAGINAL | 3 refills | Status: DC

## 2016-10-16 NOTE — Progress Notes (Signed)
Reviewed personally.  M. Suzanne Miyonna Ormiston, MD.  

## 2016-10-16 NOTE — Patient Instructions (Signed)

## 2016-10-16 NOTE — Progress Notes (Signed)
61 y.o. [id number] Divorced  Caucasian Fe here for annual exam.  Some vulvar irritation but relates this to not using the Premarin cream to the urethra.  She is not having any urinary symptoms..   The only other medical problems she is currently having is OA of right knee and numbness of right thigh. She is getting cortisone injections prn.   She is feeling quite stressed about new requirements at work.  Her father is now age 9 still in declining health.    Patient's last menstrual period was 06/29/2006 (approximate).          Sexually active: No.  The current method of family planning is bilateral tubal ligation and post menopausal status.    Exercising: No.  occasional walking Smoker:  no  Health Maintenance: Pap: 10/05/14, Negative with neg HR HPV  09/10/11, Negative History of Abnormal Pap: no MMG: 02/23/16, 3D-yes, Density Category B, Bi-Rads 2:  Benign Self Breast exams: sometimes Colonoscopy: 07/08/15, Normal, repeat in 10 years BMD: 02/28/15 T Score: -0.1 Spine / -0.8 Right Femur Neck / -0.8 Left Femur Neck TDaP: 05/10/15 Shingles: 05/29/12 Pneumonia: 02/15/04 Pneumovax Hep C and HIV: 10/11/15 Labs: PCP and Cardiology   reports that she has never smoked. She has never used smokeless tobacco. She reports that she drinks about 0.6 oz of alcohol per week . She reports that she does not use drugs.  Past Medical History:  Diagnosis Date  . Angina pectoris ([place]) 2005   with stent placement Anterior decscending  . Anxiety   . Arthritis   . Bone spur    Right Hip  . CAD (coronary artery disease) 2005   s/p PCI LAD and cutting balloon angioplasty  with stent reexpansion 04/2008  . Colon polyps   . Degenerative disc disease   . Depression   . GERD (gastroesophageal reflux disease)   . Hypercholesterolemia   . Hyperlipemia   . Hypertension   . Hypothyroidism   . MI (myocardial infarction) ([place]) 2005   angio with stent  . Positive PPD    neg. CXR, treated with INH for 1 yr.  about  29  . UTI (urinary tract infection) 05/31/15    Past Surgical History:  Procedure Laterality Date  . COLONOSCOPY    . CORONARY ANGIOPLASTY  2009  . CORONARY ANGIOPLASTY WITH STENT PLACEMENT  2005  . HEMORRHOID SURGERY  06/2015   Removal   . LAPAROSCOPY     for endometriosis  . LEFT HEART CATHETERIZATION WITH CORONARY ANGIOGRAM Bilateral 06/27/2011   Procedure: LEFT HEART CATHETERIZATION WITH CORONARY ANGIOGRAM;  Surgeon: Burnell Blanks, MD;  Location: [facility] CATH LAB;  Service: Cardiovascular;  Laterality: Bilateral;  . LUMBAR EPIDURAL INJECTION     many  . NERVE ROOT BLOCK     epidural steroids; multiple  . POLYPECTOMY    . ruptured disc  2007   with epidural   . TONGUE BIOPSY     keratin accumulation - Benign   . TONSILLECTOMY    . TUBAL LIGATION      Current Outpatient Prescriptions  Medication Sig Dispense Refill  . acetaminophen (TYLENOL) 500 MG tablet Take 500 mg by mouth every 6 (six) hours as needed for headache (and pain).     Marland Kitchen ALPRAZolam (XANAX) 0.5 MG tablet Take 1-3 tablets by mouth daily as needed for anxiety and sleep    . Calcium Carbonate-Vitamin D (CALTRATE 600+D) 600-400 MG-UNIT per tablet Take 1 tablet by mouth daily as needed for heartburn.     Marland Kitchen  clopidogrel (PLAVIX) 75 MG tablet TAKE 1 TABLET BY MOUTH DAILY 90 tablet 3  . cyclobenzaprine (FLEXERIL) 10 MG tablet Take 10 mg by mouth as needed for muscle spasms.     Marland Kitchen docusate sodium (COLACE) 100 MG capsule Take 100 mg by mouth as needed (for constipation).     Marland Kitchen escitalopram (LEXAPRO) 20 MG tablet Take 1 tablet by mouth daily. Reported on 10/11/2015  3  . estradiol (ESTRACE) 0.1 MG/GM vaginal cream Pea size amount to urethra 3 times a week at HS 42.5 g 3  . fluticasone (FLONASE) 50 MCG/ACT nasal spray Place 2 sprays into both nostrils daily as needed for allergies or rhinitis.    Marland Kitchen glucosamine-chondroitin 500-400 MG tablet Take 1 tablet by mouth daily.     . Hydrocodone-Acetaminophen (VICODIN PO) Take by  mouth as needed (for pain). Pt unsure of dose    . ibuprofen (ADVIL,MOTRIN) 200 MG tablet Take 600 mg by mouth every 6 (six) hours as needed. For pain    . irbesartan (AVAPRO) 150 MG tablet TAKE 1 TABLET BY MOUTH DAILY. 90 tablet 1  . levothyroxine (SYNTHROID, LEVOTHROID) 125 MCG tablet Take 125 mcg by mouth daily.     . magnesium 30 MG tablet Take 30 mg by mouth 2 (two) times daily. Reported on 07/08/2015    . metoprolol (LOPRESSOR) 25 MG tablet Take 0.5 tablets (12.5 mg total) by mouth 2 (two) times daily. 30 tablet 11  . nitroGLYCERIN (NITROSTAT) 0.4 MG SL tablet Place 1 tablet (0.4 mg total) under the tongue every 5 (five) minutes as needed. For chest pain 25 tablet 6  . omega-3 acid ethyl esters (LOVAZA) 1 G capsule Take 2 g by mouth 2 (two) times daily.      Marland Kitchen omeprazole (PRILOSEC OTC) 20 MG tablet Take 20 mg by mouth daily. With dinner    . oxybutynin (DITROPAN-XL) 10 MG 24 hr tablet Take 10 mg by mouth daily.  11  . polyethylene glycol (MIRALAX / GLYCOLAX) packet Take 17 g by mouth daily as needed. For constipation    . rosuvastatin (CRESTOR) 20 MG tablet Take 1 tablet (20 mg total) by mouth daily. 90 tablet 3  . Wheat Dextrin (BENEFIBER PO) Take by mouth daily. Pt unsure of dosage     No current facility-administered medications for this visit.     Family History  Problem Relation Age of Onset  . Colon cancer Mother 78  . Stroke Mother   . Hypertension Mother   . Diabetes Mother   . Rheum arthritis Mother   . Atrial fibrillation Mother   . Heart attack Father        x2  . Diabetes Father   . Kidney disease Father   . Heart disease Father   . Stroke Maternal Grandmother   . Colon cancer Paternal Grandmother   . Diabetes Paternal Grandmother   . Esophageal cancer Cousin 61    ROS:  Pertinent items are noted in HPI.  Otherwise, a comprehensive ROS was negative.  Exam:   BP 110/74 (BP Location: Right Arm, Patient Position: Sitting, Cuff Size: Large)   Pulse 72   Ht 5'  5.75" (1.67 m)   Wt 175 lb (79.4 kg)   LMP 06/29/2006 (Approximate)   BMI 28.46 kg/m  Height: 5' 5.75" (167 cm) Ht Readings from Last 3 Encounters:  10/16/16 5' 5.75" (1.67 m)  07/12/16 5' 5.75" (1.67 m)  11/29/15 5' 5.75" (1.67 m)    General appearance: alert, cooperative and  appears stated age Head: Normocephalic, without obvious abnormality, atraumatic Neck: no adenopathy, supple, symmetrical, trachea midline and thyroid normal to inspection and palpation Lungs: clear to auscultation bilaterally Breasts: normal appearance, no masses or tenderness Heart: regular rate and rhythm Abdomen: soft, non-tender; no masses,  no organomegaly Extremities: extremities normal, atraumatic, no cyanosis or edema Skin: Skin color, texture, turgor normal. No rashes or lesions Lymph nodes: Cervical, supraclavicular, and axillary nodes normal. No abnormal inguinal nodes palpated Neurologic: Grossly normal   Pelvic: External genitalia:  Multiple areas of atrophy               Urethra:  Pale atrophic and prolpased urethra with no masses, tenderness or lesions              Bartholin's and Skene's: normal                 Vagina: normal appearing vagina with normal color and discharge, no lesions with pelvic muscle weakness.              Cervix: anteverted              Pap taken: No. Bimanual Exam:  Uterus:  normal size, contour, position, consistency, mobility, non-tender              Adnexa: no mass, fullness, tenderness               Rectovaginal: Confirms               Anus:  normal sphincter tone, no lesions  Chaperone present: no  A:  Well Woman with normal exam  Postmenopausal - no HRT  History of MI and stents, HTN, on anticoagulation  History of OAB  History of atrophic vaginitis - rare use of Estrace vag cream  P:   Reviewed health and wellness pertinent to exam  Pap smear: no  Mammogram is due 10/18  Does not need a refill on Estrace at this time  Counseled on risk of DVT, CVA,  cancer, etc  Counseled on breast self exam, mammography screening, adequate intake of calcium and vitamin D, diet and exercise return annually or prn  An After Visit Summary was printed and given to the patient.

## 2016-10-18 DIAGNOSIS — M5136 Other intervertebral disc degeneration, lumbar region: Secondary | ICD-10-CM | POA: Diagnosis not present

## 2016-10-18 DIAGNOSIS — M5416 Radiculopathy, lumbar region: Secondary | ICD-10-CM | POA: Diagnosis not present

## 2016-10-18 DIAGNOSIS — M4156 Other secondary scoliosis, lumbar region: Secondary | ICD-10-CM | POA: Diagnosis not present

## 2016-10-18 DIAGNOSIS — M48061 Spinal stenosis, lumbar region without neurogenic claudication: Secondary | ICD-10-CM | POA: Diagnosis not present

## 2016-10-18 DIAGNOSIS — M4726 Other spondylosis with radiculopathy, lumbar region: Secondary | ICD-10-CM | POA: Diagnosis not present

## 2016-10-23 MED FILL — FLUTICASONE PROP 50 MCG SPR: 50 | 90 days supply | Qty: 48 | Fill #1

## 2016-10-30 MED FILL — METOPROLOL TARTRATE 25 MG T: 25 | 90 days supply | Qty: 90 | Fill #4

## 2016-10-30 MED FILL — OXYBUTYNIN CL ER 10 MG TAB: 10 | 30 days supply | Qty: 30 | Fill #8

## 2016-11-01 DIAGNOSIS — M1831 Unilateral post-traumatic osteoarthritis of first carpometacarpal joint, right hand: Secondary | ICD-10-CM | POA: Diagnosis not present

## 2016-11-20 ENCOUNTER — Other Ambulatory Visit: Payer: Self-pay | Admitting: Cardiovascular Disease

## 2016-11-20 MED ORDER — IRBESARTAN 150 MG PO TABS: 150.0000 mg | ORAL_TABLET | Freq: Every day | ORAL | 2 refills | Status: DC

## 2016-11-20 MED FILL — ESCITALOPRAM 20 MG TABLET: 20 | 90 days supply | Qty: 90 | Fill #1

## 2016-11-20 MED FILL — ALPRAZolam 0.5 MG TABS: 0.5 | 20 days supply | Qty: 60 | Fill #2

## 2016-11-20 MED FILL — IRBESARTAN 150 MG TABLET: 150 | 90 days supply | Qty: 90 | Fill #0

## 2016-11-26 MED FILL — OXYBUTYNIN CL ER 10 MG TAB: 10 | 30 days supply | Qty: 30 | Fill #9

## 2016-12-18 MED FILL — ALPRAZolam 0.5 MG TABS: 0.5 | 20 days supply | Qty: 60 | Fill #3

## 2016-12-24 MED FILL — OXYBUTYNIN CL ER 10 MG TAB: 10 | 30 days supply | Qty: 30 | Fill #10

## 2016-12-24 MED FILL — OMEGA-3 ETHYL ESTERS 1 GM C: 1 | 90 days supply | Qty: 360 | Fill #1

## 2016-12-27 MED FILL — HYDROCODON-APAP 5-325: 5-325 | 20 days supply | Qty: 40 | Fill #0

## 2017-01-18 MED FILL — ALPRAZolam 0.5 MG TABS: 0.5 | 20 days supply | Qty: 60 | Fill #4

## 2017-01-18 MED FILL — ROSUVASTATIN CALCIUM 20 MG: 20 | 90 days supply | Qty: 90 | Fill #2

## 2017-01-18 MED FILL — OXYBUTYNIN CL ER 10 MG TAB: 10 | 30 days supply | Qty: 30 | Fill #11

## 2017-01-25 ENCOUNTER — Other Ambulatory Visit: Payer: Self-pay | Admitting: Cardiovascular Disease

## 2017-01-25 MED FILL — CYCLOBENZAPRINE 10 MG TAB: 10 | 10 days supply | Qty: 30 | Fill #0

## 2017-01-25 MED FILL — METOPROLOL TARTRATE 25 MG T: 25 | 90 days supply | Qty: 90 | Fill #0

## 2017-02-01 ENCOUNTER — Other Ambulatory Visit: Payer: Self-pay | Admitting: Cardiovascular Disease

## 2017-02-01 MED FILL — CLOPIDOGREL 75 MG TABLET: 75 | 90 days supply | Qty: 90 | Fill #0

## 2017-02-12 DIAGNOSIS — L817 Pigmented purpuric dermatosis: Secondary | ICD-10-CM | POA: Diagnosis not present

## 2017-02-12 DIAGNOSIS — D692 Other nonthrombocytopenic purpura: Secondary | ICD-10-CM | POA: Diagnosis not present

## 2017-02-12 DIAGNOSIS — D2372 Other benign neoplasm of skin of left lower limb, including hip: Secondary | ICD-10-CM | POA: Diagnosis not present

## 2017-02-12 DIAGNOSIS — D2261 Melanocytic nevi of right upper limb, including shoulder: Secondary | ICD-10-CM | POA: Diagnosis not present

## 2017-02-12 DIAGNOSIS — D1801 Hemangioma of skin and subcutaneous tissue: Secondary | ICD-10-CM | POA: Diagnosis not present

## 2017-02-12 DIAGNOSIS — D1721 Benign lipomatous neoplasm of skin and subcutaneous tissue of right arm: Secondary | ICD-10-CM | POA: Diagnosis not present

## 2017-02-12 DIAGNOSIS — D2371 Other benign neoplasm of skin of right lower limb, including hip: Secondary | ICD-10-CM | POA: Diagnosis not present

## 2017-02-20 MED FILL — IRBESARTAN 150 MG TABLET: 150 | 90 days supply | Qty: 90 | Fill #1

## 2017-02-20 MED FILL — LEVOTHYROXINE 125 MCG TABLE: 125 | 90 days supply | Qty: 90 | Fill #1

## 2017-02-20 MED FILL — ESCITALOPRAM 20 MG TABLET: 20 | 90 days supply | Qty: 90 | Fill #2

## 2017-02-22 MED FILL — ALPRAZolam 0.5 MG TABS: 0.5 | 20 days supply | Qty: 60 | Fill #0

## 2017-02-25 DIAGNOSIS — Z1231 Encounter for screening mammogram for malignant neoplasm of breast: Secondary | ICD-10-CM | POA: Diagnosis not present

## 2017-02-26 DIAGNOSIS — R351 Nocturia: Secondary | ICD-10-CM | POA: Diagnosis not present

## 2017-02-26 DIAGNOSIS — N3946 Mixed incontinence: Secondary | ICD-10-CM | POA: Diagnosis not present

## 2017-02-26 MED FILL — OXYBUTYNIN CL ER 10 MG TAB: 10 | 90 days supply | Qty: 90 | Fill #0

## 2017-02-28 ENCOUNTER — Ambulatory Visit: Payer: 59 | Admitting: Podiatry

## 2017-03-05 ENCOUNTER — Ambulatory Visit: Payer: 59

## 2017-03-05 ENCOUNTER — Encounter: Payer: Self-pay | Admitting: Podiatry

## 2017-03-05 ENCOUNTER — Ambulatory Visit (INDEPENDENT_AMBULATORY_CARE_PROVIDER_SITE_OTHER): Payer: 59

## 2017-03-05 ENCOUNTER — Ambulatory Visit: Payer: 59 | Admitting: Podiatry

## 2017-03-05 DIAGNOSIS — M722 Plantar fascial fibromatosis: Secondary | ICD-10-CM

## 2017-03-05 DIAGNOSIS — M6740 Ganglion, unspecified site: Secondary | ICD-10-CM

## 2017-03-05 DIAGNOSIS — M2012 Hallux valgus (acquired), left foot: Secondary | ICD-10-CM | POA: Diagnosis not present

## 2017-03-05 NOTE — Patient Instructions (Addendum)
Plantar Fasciitis Rehab Ask your health care provider which exercises are safe for you. Do exercises exactly as told by your health care provider and adjust them as directed. It is normal to feel mild stretching, pulling, tightness, or discomfort as you do these exercises, but you should stop right away if you feel sudden pain or your pain gets worse. Do not begin these exercises until told by your health care provider. Stretching and range of motion exercises These exercises warm up your muscles and joints and improve the movement and flexibility of your foot. These exercises also help to relieve pain. Exercise A: Plantar fascia stretch  1. Sit with your left / right leg crossed over your opposite knee. 2. Hold your heel with one hand with that thumb near your arch. With your other hand, hold your toes and gently pull them back toward the top of your foot. You should feel a stretch on the bottom of your toes or your foot or both. 3. Hold this stretch for__________ seconds. 4. Slowly release your toes and return to the starting position. Repeat __________ times. Complete this exercise __________ times a day. Exercise B: Gastroc, standing  1. Stand with your hands against a wall. 2. Extend your left / right leg behind you, and bend your front knee slightly. 3. Keeping your heels on the floor and keeping your back knee straight, shift your weight toward the wall without arching your back. You should feel a gentle stretch in your left / right calf. 4. Hold this position for __________ seconds. Repeat __________ times. Complete this exercise __________ times a day. Exercise C: Soleus, standing 1. Stand with your hands against a wall. 2. Extend your left / right leg behind you, and bend your front knee slightly. 3. Keeping your heels on the floor, bend your back knee and slightly shift your weight over the back leg. You should feel a gentle stretch deep in your calf. 4. Hold this position for  __________ seconds. Repeat __________ times. Complete this exercise __________ times a day. Exercise D: Gastrocsoleus, standing 1. Stand with the ball of your left / right foot on a step. The ball of your foot is on the walking surface, right under your toes. 2. Keep your other foot firmly on the same step. 3. Hold onto the wall or a railing for balance. 4. Slowly lift your other foot, allowing your body weight to press your heel down over the edge of the step. You should feel a stretch in your left / right calf. 5. Hold this position for __________ seconds. 6. Return both feet to the step. 7. Repeat this exercise with a slight bend in your left / right knee. Repeat __________ times with your left / right knee straight and __________ times with your left / right knee bent. Complete this exercise __________ times a day. Balance exercise This exercise builds your balance and strength control of your arch to help take pressure off your plantar fascia. Exercise E: Single leg stand 1. Without shoes, stand near a railing or in a doorway. You may hold onto the railing or door frame as needed. 2. Stand on your left / right foot. Keep your big toe down on the floor and try to keep your arch lifted. Do not let your foot roll inward. 3. Hold this position for __________ seconds. 4. If this exercise is too easy, you can try it with your eyes closed or while standing on a pillow. Repeat __________ times. Complete  this exercise __________ times a day. This information is not intended to replace advice given to you by your health care provider. Make sure you discuss any questions you have with your health care provider. Document Released: 04/16/2005 Document Revised: 12/20/2015 Document Reviewed: 02/28/2015 Elsevier Interactive Patient Education  2018 [employer] A bunion is a bump on the base of the big toe that forms when the bones of the big toe joint move out of position. Bunions may be  small at first, but they often get larger over time. The can make walking painful. What are the causes? A bunion may be caused by:  Wearing narrow or pointed shoes that force the big toe to press against the other toes.  Abnormal foot development that causes the foot to roll inward (pronate).  Changes in the foot that are caused by certain diseases, such as rheumatoid arthritis and polio.  A foot injury.  What increases the risk? The following factors may make you more likely to develop this condition:  Wearing shoes that squeeze the toes together.  Having certain diseases, such as: ? Rheumatoid arthritis. ? Polio. ? Cerebral palsy.  Having family members who have bunions.  Being born with a foot deformity, such as flat feet or low arches.  Doing activities that put a lot of pressure on the feet, such as ballet dancing.  What are the signs or symptoms? The main symptom of a bunion is a noticeable bump on the big toe. Other symptoms may include:  Pain.  Swelling around the big toe.  Redness and inflammation.  Thick or hardened skin on the big toe or between the toes.  Stiffness or loss of motion in the big toe.  Trouble with walking.  How is this diagnosed? A bunion may be diagnosed based on your symptoms, medical history, and activities. You may have tests, such as:  X-rays. These allow your health care provider to check the position of the bones in your foot and look for damage to your joint. They also help your health care provider to determine the severity of your bunion and the best way to treat it.  Joint aspiration. In this test, a sample of fluid is removed from the toe joint. This test, which may be done if you are in a lot of pain, helps to rule out diseases that cause painful swelling of the joints, such as arthritis.  How is this treated? There is no cure for a bunion, but treatment can help to prevent a bunion from getting worse. Treatment depends on the  severity of your symptoms. Your health care provider may recommend:  Wearing shoes that have a wide toe box.  Using bunion pads to cushion the affected area.  Taping your toes together to keep them in a normal position.  Placing a device inside your shoe (orthotics) to help reduce pressure on your toe joint.  Taking medicine to ease pain, inflammation, and swelling.  Applying heat or ice to the affected area.  Doing stretching exercises.  Surgery to remove scar tissue and move the toes back into their normal position. This treatment is rare.  Follow these instructions at home:  Support your toe joint with proper footwear, shoe padding, or taping as told by your health care provider.  Take over-the-counter and prescription medicines only as told by your health care provider.  If directed, apply ice to the injured area: ? Put ice in a plastic bag. ? Place a towel between  your skin and the bag. ? Leave the ice on for 20 minutes, 2-3 times per day.  If directed, apply heat to the affected area before you exercise. Use the heat source that your health care provider recommends, such as a moist heat pack or a heating pad. ? Place a towel between your skin and the heat source. ? Leave the heat on for 20-30 minutes. ? Remove the heat if your skin turns bright red. This is especially important if you are unable to feel pain, heat, or cold. You may have a greater risk of getting burned.  Do exercises as told by your health care provider.  Keep all follow-up visits as told by your health care provider. Contact a health care provider if:  Your symptoms get worse.  Your symptoms do not improve in 2 weeks. Get help right away if:  You have severe pain and trouble with walking. This information is not intended to replace advice given to you by your health care provider. Make sure you discuss any questions you have with your health care provider. Document Released: 04/16/2005 Document  Revised: 09/22/2015 Document Reviewed: 11/14/2014 Elsevier Interactive Patient Education  [employer].

## 2017-03-06 ENCOUNTER — Telehealth: Payer: Self-pay | Admitting: *Deleted

## 2017-03-06 DIAGNOSIS — M2012 Hallux valgus (acquired), left foot: Secondary | ICD-10-CM | POA: Insufficient documentation

## 2017-03-06 DIAGNOSIS — M722 Plantar fascial fibromatosis: Secondary | ICD-10-CM | POA: Insufficient documentation

## 2017-03-06 NOTE — Telephone Encounter (Signed)
I'm returning your call.  Dr. Jacqualyn Posey is not doing surgery on December 26.  He can do it on December 19.  "Okay, put me down for that day."  Have you signed consent forms.  "No, I can stop by tomorrow to sign them."  You will need to see Dr. Jacqualyn Posey for a consultation.  "I was there yesterday for a consult, I already did that."  No, you have to see him for a consultation to sign the consent forms and to get the surgical information then we can get you scheduled.  "I can come back in sometime this week to see him."  Would you like me to transfer you to a scheduler to make an appointment with Dr. Jacqualyn Posey?  "Sure, that would be great."  The call was transferred to appointment scheduler,  Levada Dy.

## 2017-03-06 NOTE — Telephone Encounter (Signed)
"  I was just in the office for a consultation with Dr. Jacqualyn Posey about a bunionectomy.  I was considering a couple of dates for surgery.  I'd like to see if he's available on a couple of dates, December 19 and 26.  I am just curious to see what his availability is particularly December 26.  I know it's the day after Christmas but that's the better one for me if it's good for him.  I'm just touching bases to see."

## 2017-03-06 NOTE — Progress Notes (Signed)
Subjective:    Patient ID: Sarah Ballard, female   DOB: 61 y.o.   MRN: [id number]   HPI Ms. Spelman presents the office they for multiple concerns. Her primary concern is for painful bunions to her left foot. She states the areas and painful with pressure in shoes. She states that he gets red and very inflamed dorsally and the day. She has tried offloading, shoe changes without any significant improvement and she is interested in surgical intervention. She states that she has family members who also had significant bunions the thecal order the surgery at worsen she'll like to have the area fixed. 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 She also states that she has noticed and not perform on the arch for foot. They occasionally get tender. She is unsure how long this been ongoing for. Denies any redness or warmth and she's had no recent treatment for this. She has no other concerns.   Review of Systems  All other systems reviewed and are negative.  Past Medical History:  Diagnosis Date  . Angina pectoris ([place]) 2005   with stent placement Anterior decscending  . Anxiety   . Arthritis   . Bone spur    Right Hip  . CAD (coronary artery disease) 2005   s/p PCI LAD and cutting balloon angioplasty  with stent reexpansion 04/2008  . Colon polyps   . Degenerative disc disease   . Depression   . GERD (gastroesophageal reflux disease)   . Hypercholesterolemia   . Hyperlipemia   . Hypertension   . Hypothyroidism   . MI (myocardial infarction) ([place]) 2005   angio with stent  . Positive PPD    neg. CXR, treated with INH for 1 yr.  about 44  . UTI (urinary tract infection) 05/31/15    Past Surgical History:  Procedure Laterality Date  . COLONOSCOPY    . CORONARY ANGIOPLASTY  2009  . CORONARY ANGIOPLASTY WITH STENT PLACEMENT  2005  . HEMORRHOID SURGERY  06/2015   Removal   . LAPAROSCOPY     for endometriosis  . LUMBAR EPIDURAL  INJECTION     many  . NERVE ROOT BLOCK     epidural steroids; multiple  . POLYPECTOMY    . ruptured disc  2007   with epidural   . TONGUE BIOPSY     keratin accumulation - Benign   . TONSILLECTOMY    . TUBAL LIGATION       Current Outpatient Medications:  .  NON FORMULARY, [facility]  Scar Cream -  Verapamil 10%, Pentoxifylline 5% Apply 1-2 grams to affected area 3-4 times daily Qty. 120 gm 3 refills, Disp: , Rfl:  .  acetaminophen (TYLENOL) 500 MG tablet, Take 500 mg by mouth every 6 (six) hours as needed for headache (and pain). , Disp: , Rfl:  .  ALPRAZolam (XANAX) 0.5 MG tablet, Take 1-3 tablets by mouth daily as needed for anxiety and sleep, Disp: , Rfl:  .  Calcium Carbonate-Vitamin D (CALTRATE 600+D) 600-400 MG-UNIT per tablet, Take 1 tablet by mouth daily as needed for heartburn. , Disp: , Rfl:  .  clopidogrel (PLAVIX) 75 MG tablet, TAKE 1 TABLET BY MOUTH DAILY, Disp: 90 tablet, Rfl: 1 .  cyclobenzaprine (FLEXERIL) 10 MG tablet, Take 10 mg by mouth as needed for muscle spasms. , Disp: , Rfl:  .  docusate sodium (COLACE) 100 MG capsule, Take 100  mg by mouth as needed (for constipation). , Disp: , Rfl:  .  escitalopram (LEXAPRO) 20 MG tablet, Take 1 tablet by mouth daily. Reported on 10/11/2015, Disp: , Rfl: 3 .  estradiol (ESTRACE) 0.1 MG/GM vaginal cream, Pea size amount to urethra 3 times a week at HS, Disp: 42.5 g, Rfl: 3 .  fluticasone (FLONASE) 50 MCG/ACT nasal spray, Place 2 sprays into both nostrils daily as needed for allergies or rhinitis., Disp: , Rfl:  .  glucosamine-chondroitin 500-400 MG tablet, Take 1 tablet by mouth daily. , Disp: , Rfl:  .  Hydrocodone-Acetaminophen (VICODIN PO), Take by mouth as needed (for pain). Pt unsure of dose, Disp: , Rfl:  .  ibuprofen (ADVIL,MOTRIN) 200 MG tablet, Take 600 mg by mouth every 6 (six) hours as needed. For pain, Disp: , Rfl:  .  irbesartan (AVAPRO) 150 MG tablet, Take 1 tablet (150 mg total) by mouth daily., Disp: 90  tablet, Rfl: 2 .  levothyroxine (SYNTHROID, LEVOTHROID) 125 MCG tablet, Take 125 mcg by mouth daily. , Disp: , Rfl:  .  magnesium 30 MG tablet, Take 30 mg by mouth 2 (two) times daily. Reported on 07/08/2015, Disp: , Rfl:  .  metoprolol tartrate (LOPRESSOR) 25 MG tablet, TAKE 1/2 TABLET BY MOUTH 2 TIMES DAILY., Disp: 90 tablet, Rfl: 3 .  nitroGLYCERIN (NITROSTAT) 0.4 MG SL tablet, Place 1 tablet (0.4 mg total) under the tongue every 5 (five) minutes as needed. For chest pain, Disp: 25 tablet, Rfl: 6 .  omega-3 acid ethyl esters (LOVAZA) 1 G capsule, Take 2 g by mouth 2 (two) times daily.  , Disp: , Rfl:  .  omeprazole (PRILOSEC OTC) 20 MG tablet, Take 20 mg by mouth daily. With dinner, Disp: , Rfl:  .  oxybutynin (DITROPAN-XL) 10 MG 24 hr tablet, Take 10 mg by mouth daily., Disp: , Rfl: 11 .  polyethylene glycol (MIRALAX / GLYCOLAX) packet, Take 17 g by mouth daily as needed. For constipation, Disp: , Rfl:  .  rosuvastatin (CRESTOR) 20 MG tablet, Take 1 tablet (20 mg total) by mouth daily., Disp: 90 tablet, Rfl: 3 .  Wheat Dextrin (BENEFIBER PO), Take by mouth daily. Pt unsure of dosage, Disp: , Rfl:   Allergies  Allergen Reactions  . Latex     itching  . Lisinopril Cough  . Tramadol Hcl     REACTION: nausea, vomiting  . Ultram [Tramadol]     Nausea and vomitting     Social History   Socioeconomic History  . Marital status: Divorced    Spouse name: Not on file  . Number of children: 3  . Years of education: Not on file  . Highest education level: Not on file  Social Needs  . Financial resource strain: Not on file  . Food insecurity - worry: Not on file  . Food insecurity - inability: Not on file  . Transportation needs - medical: Not on file  . Transportation needs - non-medical: Not on file  Occupational History  . Occupation: Programmer, multimedia: [employer]  Tobacco Use  . Smoking status: Never Smoker  . Smokeless tobacco: Never Used  Substance and Sexual Activity  .  Alcohol use: Yes    Alcohol/week: 0.6 oz    Types: 1 Glasses of wine per week    Comment: occ wine  . Drug use: No  . Sexual activity: No    Birth control/protection: Post-menopausal    Comment: BTL 07/03/89  Other Topics Concern  .  Not on file  Social History Narrative  . Not on file         Objective:  Physical Exam General: AAO x3, NAD  Dermatological: Skin is warm, dry and supple bilateral. Nails x 10 are well manicured; remaining integument appears unremarkable at this time. There are no open sores, no preulcerative lesions, no rash or signs of infection present.  Vascular: Dorsalis Pedis artery and Posterior Tibial artery pedal pulses are 2/4 bilateral with immedate capillary fill time. . There is no pain with calf compression, swelling, warmth, erythema.   Neruologic: Grossly intact via light touch bilateral.Protective threshold with Semmes Wienstein monofilament intact to all pedal sites bilateral.   Musculoskeletal: There is moderate HAV present on the left foot. There is erythema on the medial aspect of the bunion from irritation of wearing shoes. There is tenderness on the bunion site. There is no pain or crepitation with first MPJ range of motion. There is no first ray hypermobility present. On the medial band of the plantar fascia and the arch of the foot with the left side worse right is a firm nonmobile mass soft tissue mass consistent with a plantar fibroma. There is no overlying edema, erythema. Mild tenderness to palpation of this area. Muscular strength 5/5 in all groups tested bilateral.  Gait: Unassisted, Nonantalgic.      Assessment:     Symptomatic left HAV; plantar fibromatosis    Plan:     -Treatment options discussed including all alternatives, risks, and complications -Etiology of symptoms were discussed -X-rays were obtained and reviewed with the patient. Moderate HAV is present. There is no evidence of acute fracture.Form body identified.  1.  HAV -We discussed both conservative as well as surgical treatment options for this. We discussed shoe gear changes, injections, offloading and padding. We discussed surgical intervention as well including an [place] bunionectomy with screw fixation. We discussed the surgery as well as the postoperative course. She would like to make a surgery likely before the end of the year. She was discharged with her insurance company regards to a quote. I have submitted this to Jocelyn Lamer from our office to check insurance coverage. Also if she undergoes surgery she will need to stop Plavix. She has an upcoming appointment with Dr. Acie Fredrickson next week. Would need to get clearance from him as well.   2. Platnar Fibroma -Discussed stretching exercises for this. I ordered a compound cream to include topical verapamil to help with this as well. She declined steroid injection.  Celesta Gentile, DPM

## 2017-03-13 ENCOUNTER — Ambulatory Visit: Payer: 59 | Admitting: Cardiovascular Disease

## 2017-03-13 ENCOUNTER — Encounter: Payer: Self-pay | Admitting: Cardiovascular Disease

## 2017-03-13 VITALS — BP 104/78 | HR 64 | Ht 66.0 in | Wt 170.4 lb

## 2017-03-13 DIAGNOSIS — E782 Mixed hyperlipidemia: Secondary | ICD-10-CM | POA: Diagnosis not present

## 2017-03-13 DIAGNOSIS — I2510 Atherosclerotic heart disease of native coronary artery without angina pectoris: Secondary | ICD-10-CM

## 2017-03-13 LAB — BASIC METABOLIC PANEL
BUN / CREAT RATIO: 17 (ref 12–28)
BUN: 13 mg/dL (ref 8–27)
CHLORIDE: 102 mmol/L (ref 96–106)
CO2: 25 mmol/L (ref 20–29)
Calcium: 9.2 mg/dL (ref 8.7–10.3)
Creatinine, Ser: 0.78 mg/dL (ref 0.57–1.00)
GFR calc non Af Amer: 82 mL/min/{1.73_m2} (ref >59)
GFR, EST AFRICAN AMERICAN: 94 mL/min/{1.73_m2} (ref >59)
GLUCOSE: 99 mg/dL (ref 65–99)
Potassium: 4.2 mmol/L (ref 3.5–5.2)
SODIUM: 141 mmol/L (ref 134–144)

## 2017-03-13 LAB — HEPATIC FUNCTION PANEL
ALT: 17 IU/L (ref 0–32)
AST: 25 IU/L (ref 0–40)
Albumin: 4.4 g/dL (ref 3.6–4.8)
Alkaline Phosphatase: 70 IU/L (ref 39–117)
BILIRUBIN, DIRECT: 0.15 mg/dL (ref 0.00–0.40)
Bilirubin Total: 0.5 mg/dL (ref 0.0–1.2)
Total Protein: 6.2 g/dL (ref 6.0–8.5)

## 2017-03-13 LAB — LIPID PANEL
CHOLESTEROL TOTAL: 150 mg/dL (ref 100–199)
Chol/HDL Ratio: 2.2 ratio (ref 0.0–4.4)
HDL: 69 mg/dL (ref >39)
LDL Calculated: 67 mg/dL (ref 0–99)
TRIGLYCERIDES: 70 mg/dL (ref 0–149)
VLDL CHOLESTEROL CAL: 14 mg/dL (ref 5–40)

## 2017-03-13 NOTE — Progress Notes (Signed)
Sarah Ballard Date of Birth  [birth year] [facility] [address]    [place], [place]  [address] [phone]  Fax  [phone]  Problem List 1. Coronary artery disease-  2. anxiety 3. Hyperlipidemia 4. Hypertension 5. Hypothyroidism 6. Chronic back pain     The 61 yo  female with a history of coronary artery disease. She's status post PTCA and stenting of her left anterior descending artery. She also had [place] procedure and reexpansion of that stent in January 2010.  She was admitted recently to the hospital for chest pain an dizziness and cath revealed no significant irregularities.  He is walking twice a week. She's not having episodes of angina with walking. 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  October 27, 2012:  Sarah Ballard is doing well from a cardiac standpoint.   She is having some back pain - needs to have a back injection but does not want to stop the Plavix.    October 27, 2013:  Sarah Ballard is doing well.  She denies any chest pain.  Bumping into lots of things and has bruising on her legs.   Her potassium was low at her last check.    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  Asked about stopping the plavix   Jan. 24, 2017:  Sarah Ballard is doing ok Has been losing weight.   Lots of stress with taking care of her father and her divorce and her son .  No CP .     November 17, 2015:  Doing ok.  Sleepy .  Had to work late last night . No CP,  Breathing is good Lots of anxiety with work stress.   July 12, 2016:  Doing ok from a cardiac standpoint Has gained some weight .  Rare episodes of CP - she thinks its due to stress or indigestion,   its not related to exertion  requests a refill of her NTG  Is trying to move  Is having lots of artheritis   Pain  Is falling lots at work   Nov. 14,  2018  Has  Moved since I last saw her. Is having some chest pain  Thinks its GERD or anxeity.  Lots of stress  Lots of stress related to the move.    The CP are atypical , do not feel like her previouis angina Takes NTG - does not really relieve the CP Finds [employer] work   Is planning on having bunion surgery at the end of this year Will be at low risk Will need to hold Plavix for 5 days prior to surgery .    Father has gained some fluid .    Current Outpatient Medications on File Prior to Visit  Medication Sig Dispense Refill  . acetaminophen (TYLENOL) 500 MG tablet Take 500 mg by mouth every 6 (six) hours as needed for headache (and pain).     [place] [facility] ALPRAZolam (XANAX) 0.5 MG tablet Take 1-3 tablets by mouth daily as needed for anxiety and sleep    . Calcium Carbonate-Vitamin D (CALTRATE 600+D) 600-400 MG-UNIT per tablet Take 1 tablet daily by mouth.     . clopidogrel (PLAVIX) 75 MG tablet TAKE 1 TABLET BY MOUTH DAILY 90 tablet 1  . cyclobenzaprine (FLEXERIL) 10 MG tablet Take 10 mg as  directed by mouth. For muscle spasms    . docusate sodium (COLACE) 100 MG capsule Take 100 mg by mouth as needed (for constipation).     [place] Kitchen escitalopram (LEXAPRO) 20 MG tablet Take 1 tablet by mouth daily. Reported on 10/11/2015  3  . estradiol (ESTRACE) 0.1 MG/GM vaginal cream Pea size amount to urethra 3 times a week at HS 42.5 g 3  . fluticasone (FLONASE) 50 MCG/ACT nasal spray Place 2 sprays into both nostrils daily as needed for allergies or rhinitis.    [place] Kitchen glucosamine-chondroitin 500-400 MG tablet Take 2 tablets daily by mouth.     [place] [facility] HYDROcodone-acetaminophen (NORCO/VICODIN) 5-325 MG tablet Take 0.5-1 tablets 2 (two) times daily as needed by mouth for pain.  0  . ibuprofen (ADVIL,MOTRIN) 200 MG tablet Take 600 mg by mouth every 6 (six) hours as needed. For pain    . irbesartan (AVAPRO) 150 MG tablet Take 1 tablet (150 mg total) by mouth daily. 90 tablet 2  . levothyroxine (SYNTHROID, LEVOTHROID) 125 MCG  tablet Take 125 mcg by mouth daily.     . magnesium 30 MG tablet Take 60 mg daily by mouth. Reported on 07/08/2015    . metoprolol tartrate (LOPRESSOR) 25 MG tablet TAKE 1/2 TABLET BY MOUTH 2 TIMES DAILY. 90 tablet 3  . nitroGLYCERIN (NITROSTAT) 0.4 MG SL tablet Place 1 tablet (0.4 mg total) under the tongue every 5 (five) minutes as needed. For chest pain 25 tablet 6  . NON FORMULARY [facility]  Scar Cream -  Verapamil 10%, Pentoxifylline 5% Apply 1-2 grams to affected area 3-4 times daily Qty. 120 gm 3 refills    . omega-3 acid ethyl esters (LOVAZA) 1 G capsule Take 2 g by mouth 2 (two) times daily.      [place] Kitchen omeprazole (PRILOSEC OTC) 20 MG tablet Take 20 mg by mouth daily. With dinner    . oxybutynin (DITROPAN-XL) 10 MG 24 hr tablet Take 10 mg by mouth daily.  11  . polyethylene glycol (MIRALAX / GLYCOLAX) packet Take 17 g by mouth daily as needed. For constipation    . rosuvastatin (CRESTOR) 20 MG tablet Take 1 tablet (20 mg total) by mouth daily. 90 tablet 3  . Wheat Dextrin (BENEFIBER PO) Take by mouth daily. Pt unsure of dosage     No current facility-administered medications on file prior to visit.     Allergies  Allergen Reactions  . Latex     itching  . Lisinopril Cough  . Tramadol Hcl     REACTION: nausea, vomiting  . Ultram [Tramadol]     Nausea and vomitting     Past Medical History:  Diagnosis Date  . Angina pectoris ([place]) 2005   with stent placement Anterior decscending  . Anxiety   . Arthritis   . Bone spur    Right Hip  . CAD (coronary artery disease) 2005   s/p PCI LAD and cutting balloon angioplasty  with stent reexpansion 04/2008  . Colon polyps   . Degenerative disc disease   . Depression   . GERD (gastroesophageal reflux disease)   . Hypercholesterolemia   . Hyperlipemia   . Hypertension   . Hypothyroidism   . MI (myocardial infarction) ([place]) 2005   angio with stent  . Positive PPD    neg. CXR, treated with INH for 1 yr.  about 70  . UTI  (urinary tract infection) 05/31/15    Past Surgical History:  Procedure Laterality Date  . COLONOSCOPY    .  CORONARY ANGIOPLASTY  2009  . CORONARY ANGIOPLASTY WITH STENT PLACEMENT  2005  . HEMORRHOID SURGERY  06/2015   Removal   . LAPAROSCOPY     for endometriosis  . LUMBAR EPIDURAL INJECTION     many  . NERVE ROOT BLOCK     epidural steroids; multiple  . POLYPECTOMY    . ruptured disc  2007   with epidural   . TONGUE BIOPSY     keratin accumulation - Benign   . TONSILLECTOMY    . TUBAL LIGATION      Social History   Tobacco Use  Smoking Status Never Smoker  Smokeless Tobacco Never Used    Social History   Substance and Sexual Activity  Alcohol Use Yes  . Alcohol/week: 0.6 oz  . Types: 1 Glasses of wine per week   Comment: occ wine    Family History  Problem Relation Age of Onset  . Colon cancer Mother 16  . Stroke Mother   . Hypertension Mother   . Diabetes Mother   . Rheum arthritis Mother   . Atrial fibrillation Mother   . Heart attack Father        x2  . Diabetes Father   . Kidney disease Father   . Heart disease Father   . Stroke Maternal Grandmother   . Colon cancer Paternal Grandmother   . Diabetes Paternal Grandmother   . Esophageal cancer Cousin 14    Reviw of Systems:  Reviewed in the HPI.  All other systems are negative.  Physical Exam: Blood pressure 104/78, pulse 64, height 5\' 6"  (1.676 m), weight 170 lb 6.4 oz (77.3 kg), last menstrual period 06/29/2006.  GEN:  Well nourished, well developed in no acute distress HEENT: Normal NECK: No JVD; No carotid bruits LYMPHATICS: No lymphadenopathy CARDIAC: RR, no murmurs, rubs, gallops RESPIRATORY:  Clear to auscultation without rales, wheezing or rhonchi  ABDOMEN: Soft, non-tender, non-distended MUSCULOSKELETAL:  No edema; No deformity  SKIN: Warm and dry NEUROLOGIC:  Alert and oriented x 3   ECG :  -March 13, 2017: Normal sinus rhythm at 64.  EKG is normal.  There are no ST or T  wave changes.   Assessment / Plan:   1. Coronary artery disease- has some atypical CP.  None of her symptoms sound like angina.  We will continue to follow her. 2. anxiety 3. Hyperlipidemia -  we'll check fasting labs today  , continue Crestor 20 mg a day .   4. Hypertension  -  BP is great .   5. Hypothyroidism 6. Chronic back pain 7. Weight gain :  Has gained some weight.   Advised watching her diet and exercising regularly   8. Bunion surgery  Is planning on having bunion surgery at the end of this year Will be at low risk Will need to hold Plavix for 5 days prior to surgery .    Body mass index is 27.5 kg/m.   Mertie Moores, MD  03/13/2017 10:20 AM    [address] [place],  [place], [place]  [address] Pager [phone] Phone: [phone]; Fax: [phone]

## 2017-03-13 NOTE — Patient Instructions (Addendum)
[facility] [phone]  Medication Instructions:  Your physician recommends that you continue on your current medications as directed. Please refer to the Current Medication list given to you today.   Labwork: TODAY - cholesterol, liver panel, basic metabolic panel   Testing/Procedures: None Ordered   Follow-Up: Your physician recommends that you schedule a follow-up appointment in: 3 months with Dr. Acie Fredrickson   If you need a refill on your cardiac medications before your next appointment, please call your pharmacy.   Thank you for choosing [facility]! Christen Bame, RN [phone]

## 2017-03-15 ENCOUNTER — Encounter: Payer: Self-pay | Admitting: Podiatry

## 2017-03-15 ENCOUNTER — Ambulatory Visit: Payer: 59 | Admitting: Podiatry

## 2017-03-15 DIAGNOSIS — M2012 Hallux valgus (acquired), left foot: Secondary | ICD-10-CM

## 2017-03-15 NOTE — Patient Instructions (Signed)

## 2017-03-20 MED FILL — OMEGA-3 ETHYL ESTERS 1 GM C: 1 | 90 days supply | Qty: 360 | Fill #2

## 2017-03-20 MED FILL — ALPRAZolam 0.5 MG TABS: 0.5 | 20 days supply | Qty: 60 | Fill #1

## 2017-03-20 NOTE — Progress Notes (Signed)
Subjective: Sarah Ballard since today for preop consultation to the painful bunion of the left foot.  She will do him proceed with surgical intervention in December given the continued pain and deformity to left foot which is been ongoing for quite some time but has been more consistent on a daily basis.  She denies any recent injury.  She has attempted numerous conservative treatments including shoe modifications, offloading, inserts, padding without any significant improvement and she wishes to proceed with surgical intervention at this time. Denies any systemic complaints such as fevers, chills, nausea, vomiting. No acute changes since last appointment, and no other complaints at this time.   Objective: AAO x3, NAD DP/PT pulses palpable 2/4 bilaterally, CRT less than 3 seconds Moderate HAV is present on the left foot and there is no pain or crepitation with first MPJ range of motion.  There is no first ray hypermobility present.  There is tenderness on the bunion on the left foot.  There is mild erythema from irritation to the patient's there is no open sores identified.  On the medial band plantar fascial the arch of the foot is a small round mobile firm mass consistent with a plantar fibroma without any pain.  There is no other areas of tenderness identified at this time. No open lesions or pre-ulcerative lesions.  No pain with calf compression, swelling, warmth, erythema  Assessment: Moderate HAV left foot  Plan: -All treatment options discussed with the patient including all alternatives, risks, complications.  -I reviewed her x-rays with her today.  There is moderate HAV present. -We discussed both conservative as well as surgical treatment options.  She is attempted numerous conservative treatments for any significant improvement at this time she is requesting surgical intervention scheduled to go to proceed with this next month. -We discussed an Liane Comber bunionectomy with screw fixation -The  incision placement as well as the postoperative course was discussed with the patient. I discussed risks of the surgery which include, but not limited to, infection, bleeding, pain, swelling, need for further surgery, delayed or nonhealing, painful or ugly scar, numbness or sensation changes, over/under correction, recurrence, transfer lesions, further deformity, hardware failure, DVT/PE, loss of toe/foot. Patient understands these risks and wishes to proceed with surgery. The surgical consent was reviewed with the patient all 3 pages were signed. No promises or guarantees were given to the outcome of the procedure. All questions were answered to the best of my ability. Before the surgery the patient was encouraged to call the office if there is any further questions. The surgery will be performed at the [facility] on an outpatient basis. -Cam boot was dispensed for postoperative course. -We will plan on being out of work for about 8 weeks postop.  She has started her paperwork for the claim number [id number] with [employer].  -Patient encouraged to call the office with any questions, concerns, change in symptoms.   Trula Slade DPM

## 2017-04-03 DIAGNOSIS — F4323 Adjustment disorder with mixed anxiety and depressed mood: Secondary | ICD-10-CM | POA: Diagnosis not present

## 2017-04-04 DIAGNOSIS — M1831 Unilateral post-traumatic osteoarthritis of first carpometacarpal joint, right hand: Secondary | ICD-10-CM | POA: Diagnosis not present

## 2017-04-04 DIAGNOSIS — M79644 Pain in right finger(s): Secondary | ICD-10-CM | POA: Diagnosis not present

## 2017-04-05 DIAGNOSIS — I10 Essential (primary) hypertension: Secondary | ICD-10-CM | POA: Diagnosis not present

## 2017-04-05 DIAGNOSIS — E038 Other specified hypothyroidism: Secondary | ICD-10-CM | POA: Diagnosis not present

## 2017-04-05 DIAGNOSIS — F329 Major depressive disorder, single episode, unspecified: Secondary | ICD-10-CM | POA: Diagnosis not present

## 2017-04-05 DIAGNOSIS — I2510 Atherosclerotic heart disease of native coronary artery without angina pectoris: Secondary | ICD-10-CM | POA: Diagnosis not present

## 2017-04-05 DIAGNOSIS — M2010 Hallux valgus (acquired), unspecified foot: Secondary | ICD-10-CM | POA: Diagnosis not present

## 2017-04-05 DIAGNOSIS — E7849 Other hyperlipidemia: Secondary | ICD-10-CM | POA: Diagnosis not present

## 2017-04-05 DIAGNOSIS — E668 Other obesity: Secondary | ICD-10-CM | POA: Diagnosis not present

## 2017-04-09 DIAGNOSIS — Z6828 Body mass index (BMI) 28.0-28.9, adult: Secondary | ICD-10-CM | POA: Diagnosis not present

## 2017-04-09 DIAGNOSIS — M5416 Radiculopathy, lumbar region: Secondary | ICD-10-CM | POA: Diagnosis not present

## 2017-04-09 DIAGNOSIS — M7061 Trochanteric bursitis, right hip: Secondary | ICD-10-CM | POA: Diagnosis not present

## 2017-04-16 MED FILL — FLUTICASONE PROP 50 MCG SPR: 50 | 90 days supply | Qty: 48 | Fill #2

## 2017-04-17 ENCOUNTER — Encounter: Payer: Self-pay | Admitting: Podiatry

## 2017-04-17 DIAGNOSIS — M21612 Bunion of left foot: Secondary | ICD-10-CM | POA: Diagnosis not present

## 2017-04-17 DIAGNOSIS — E7800 Pure hypercholesterolemia, unspecified: Secondary | ICD-10-CM | POA: Diagnosis not present

## 2017-04-17 DIAGNOSIS — M2012 Hallux valgus (acquired), left foot: Secondary | ICD-10-CM | POA: Diagnosis not present

## 2017-04-17 DIAGNOSIS — M25572 Pain in left ankle and joints of left foot: Secondary | ICD-10-CM | POA: Diagnosis not present

## 2017-04-17 MED ORDER — OXYCODONE-ACETAMINOPHEN 5-325 MG PO TABS: 1.0000 | ORAL_TABLET | Freq: Four times a day (QID) | ORAL | 0 refills | Status: DC | PRN

## 2017-04-17 MED ORDER — PROMETHAZINE HCL 25 MG PO TABS: 25.0000 mg | ORAL_TABLET | Freq: Three times a day (TID) | ORAL | 0 refills | Status: DC | PRN

## 2017-04-17 MED ORDER — CEPHALEXIN 500 MG PO CAPS: 500.0000 mg | ORAL_CAPSULE | Freq: Three times a day (TID) | ORAL | 0 refills | Status: DC

## 2017-04-17 NOTE — Progress Notes (Signed)
Pre-operative Note  Patient presents to the [facility] today for surgical intervention of the LEFT foot for bunion correction. The surgical consent was reviewed with the patient and we discussed the procedure as well as the postoperative course. I again discussed all alternatives, risks, complications. I answered all of their questions to the best of my ability and they wish to proceed with surgery. No promises or guarantees were given as to the outcome of the surgery.   The surgical consent was signed.   Patient is NPO since midnight.  She stopped plavix last week.   No further questions or concerns today. We also discussed postop weightbearing and limitations again today.   Celesta Gentile, [place]

## 2017-04-18 ENCOUNTER — Telehealth: Payer: Self-pay | Admitting: *Deleted

## 2017-04-18 ENCOUNTER — Telehealth: Payer: Self-pay | Admitting: Podiatry

## 2017-04-18 ENCOUNTER — Other Ambulatory Visit: Payer: Self-pay | Admitting: Podiatry

## 2017-04-18 MED ORDER — OXYCODONE-ACETAMINOPHEN 5-325 MG PO TABS: 1.0000 | ORAL_TABLET | Freq: Four times a day (QID) | ORAL | 0 refills | Status: DC | PRN

## 2017-04-18 MED ORDER — CEPHALEXIN 500 MG PO CAPS: 500.0000 mg | ORAL_CAPSULE | Freq: Three times a day (TID) | ORAL | 0 refills | Status: DC

## 2017-04-18 MED ORDER — PROMETHAZINE HCL 25 MG PO TABS: 25.0000 mg | ORAL_TABLET | Freq: Three times a day (TID) | ORAL | 0 refills | Status: DC | PRN

## 2017-04-18 MED FILL — OXYCODONE-ACETAMINOPHEN 5-3: 5-325 | 5 days supply | Qty: 20 | Fill #0

## 2017-04-18 MED FILL — METOPROLOL TARTRATE 25 MG T: 25 | 90 days supply | Qty: 90 | Fill #1

## 2017-04-18 MED FILL — ALPRAZolam 0.5 MG TABS: 0.5 | 20 days supply | Qty: 60 | Fill #2

## 2017-04-18 MED FILL — CEPHALEXIN 500 MG CAPSULE: 500 | 7 days supply | Qty: 21 | Fill #0

## 2017-04-18 MED FILL — PROMETHAZINE 25 MG TABLET: 25 | 7 days supply | Qty: 20 | Fill #0

## 2017-04-18 NOTE — Telephone Encounter (Signed)
Tried to call the patient and got the voice mail and I left a message stating that I was calling to check on the patient after surgery and to call me tomorrow at the [place] number [phone]. Sarah Ballard

## 2017-04-18 NOTE — Telephone Encounter (Signed)
Per Dr. Leigh Aurora request I called pt and let her know that all three of her prescriptions had been e-scribed to [facility]. She asked if she needed to come to the office to get the hand written Rx for the pain medication and I told her no that Dr. Jacqualyn Posey was able to e-scribe all of them. She thanked [place] very much.

## 2017-04-18 NOTE — Telephone Encounter (Signed)
Called pt per Dr. Leigh Aurora request and left a voicemail asking pt to call us back to let us know what medicine she needs e-scribed to [facility] from the surgery she had yesterday. Told her to call us back at [phone] to let us know which prescriptions she needs sent and that Dr. Jacqualyn Posey would get those e-scribed for her.

## 2017-04-19 ENCOUNTER — Telehealth: Payer: Self-pay | Admitting: *Deleted

## 2017-04-19 NOTE — Telephone Encounter (Signed)
Pt states she received a call from someone in our office requesting status after surgery. Pt states she is doing well, but the leg is still numb and the anesthesiologist said it would be numb up to 30 hours and it has already been 48. I told pt it could be up to 72 hours and if she felt tingling then she should begin the pain medication. Pt states she is having trouble sleeping and I told her that when she slept she did not have to keep the foot elevated if it kept her from sleeping, she could position the pillows for comfort. I told pt the fabric covered ice pack could go behind the knee for periods of time required for icing. 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 I told pt to keep the weight bearing, dangling as close to the 15 minutes as possible, that if she was up 30 minutes at one period keep the weight bearing time as close to 15 minutes or resting time as close to 2 hours as possible. Pt states understanding.

## 2017-04-24 ENCOUNTER — Ambulatory Visit (INDEPENDENT_AMBULATORY_CARE_PROVIDER_SITE_OTHER): Payer: 59 | Admitting: Podiatry

## 2017-04-24 ENCOUNTER — Ambulatory Visit (INDEPENDENT_AMBULATORY_CARE_PROVIDER_SITE_OTHER): Payer: 59

## 2017-04-24 ENCOUNTER — Encounter: Payer: Self-pay | Admitting: Podiatry

## 2017-04-24 DIAGNOSIS — M2012 Hallux valgus (acquired), left foot: Secondary | ICD-10-CM

## 2017-04-24 MED ORDER — CEPHALEXIN 500 MG PO CAPS: 500.0000 mg | ORAL_CAPSULE | Freq: Three times a day (TID) | ORAL | 0 refills | Status: DC

## 2017-04-24 MED FILL — CLOPIDOGREL 75 MG TABLET: 75 | 90 days supply | Qty: 90 | Fill #1

## 2017-04-24 MED FILL — ROSUVASTATIN CALCIUM 20 MG: 20 | 90 days supply | Qty: 90 | Fill #3

## 2017-04-24 MED FILL — CEPHALEXIN 500 MG CAPSULE: 500 | 7 days supply | Qty: 21 | Fill #0

## 2017-04-24 MED FILL — CYCLOBENZAPRINE 10 MG TAB: 10 | 10 days supply | Qty: 30 | Fill #1

## 2017-04-24 NOTE — Progress Notes (Signed)
Subjective: Sarah Ballard is a 61 y.o. is seen today in office s/p left foot bunionectomy  preformed on 04/17/2017. They state their pain is minimal. She states that the toes are still numb since the surgery but improved some. She admits that she has been up and on her feet a lot and she has been having to do a lot for her dad. She states "I hope I didn't mess it up, my daughters have been on me about being on it too much". She has been grocery shopping, and other errands. She has not been icing. Denies any systemic complaints such as fevers, chills, nausea, vomiting. No calf pain, chest pain, shortness of breath.   Objective: General: No acute distress, AAOx3  DP/PT pulses palpable 2/4, CRT < 3 sec to all digits.  Protective sensation intact. Motor function intact.  left foot: Incision is well coapted without any evidence of dehiscence and sutures are intact. There is minimal surrounding erythema but there is no ascending cellulitis, fluctuance, crepitus, malodor, drainage/purulence. There is mild edema around the surgical site. There is minimal pain along the surgical site. Toe is in more rectus position. Ecchymosis to the digits.  No other areas of tenderness to bilateral lower extremities.  No other open lesions or pre-ulcerative lesions.  No pain with calf compression, swelling, warmth, erythema.   Assessment and Plan:  Status post left foot [place] bunionectomy doing well with no complications   -Treatment options discussed including all alternatives, risks, and complications -X-rays were obtained and reviewed with the patient. S/p bunionectomy, no evidence of acute fracture. Hardware intact.  -Antibiotic ointment was applied followed by a bandage.  Keep the dressing clean, dry, intact -Continue cam boot at all times -Will continue Keflex in the minimal surrounding erythema but this is likely more from inflammation from healing as opposed to infection. -Ice/elevation -Pain medication as  needed. -Monitor for any clinical signs or symptoms of infection and DVT/PE and directed to call the office immediately should any occur or go to the ER. -Follow-up in 1 week or sooner if any problems arise. In the meantime, encouraged to call the office with any questions, concerns, change in symptoms.   Celesta Gentile, DPM

## 2017-05-02 ENCOUNTER — Ambulatory Visit (INDEPENDENT_AMBULATORY_CARE_PROVIDER_SITE_OTHER): Payer: 59 | Admitting: Podiatry

## 2017-05-02 DIAGNOSIS — M2012 Hallux valgus (acquired), left foot: Secondary | ICD-10-CM

## 2017-05-05 NOTE — Progress Notes (Signed)
Subjective: Sarah Ballard is a 62 y.o. is seen today in office s/p left foot bunionectomy  preformed on 04/17/2017.  She states that overall she is doing well.  She has been very active and she recently just had her 2 grandchildren (5.5 yr and 2.5 yr old) at home she was "running around a lot" with them although she does state that she was wearing the cam boot.  She states that she still has some numbness to her leg from the nerve block as well as to the foot she is concerned about.  She denies having any pain. She has not been icing. Denies any systemic complaints such as fevers, chills, nausea, vomiting. No calf pain, chest pain, shortness of breath.   Objective: General: No acute distress, AAOx3  DP/PT pulses palpable 2/4, CRT < 3 sec to all digits.  Protective sensation intact. Motor function intact.  Left foot: Incision is well coapted without any evidence of dehiscence and sutures are intact.  There is no surrounding erythema today there is no ascending cellulitis.  There is no fluctuance or crepitus.  There is no malodor.  There is no clinical signs of infection noted.  The toes and rectus position.  There is no pain or crepitation restriction first MPJ range of motion. No other areas of tenderness to bilateral lower extremities.  No other open lesions or pre-ulcerative lesions.  No pain with calf compression, swelling, warmth, erythema.   Assessment and Plan:  Status post left foot [place] bunionectomy doing well with no complications   -Treatment options discussed including all alternatives, risks, and complications -Incision is healing well.  Antibiotic ointment was applied followed by a bandage.  I do the suture ends intact.  She can start to shower this weekend as long as the incision is doing well and I want her to continue with a similar dressing. -Continue cam boot.  Limit weightbearing.   -Ice and elevation. -She has finished antibiotics there is no signs of infection so we will  hold off any further antibiotics today. -Gentle range of motion exercises of the first MPJ -Monitor for any clinical signs or symptoms of infection and directed to call the office immediately should any occur or go to the ER. -Follow-up in 2 weeks or sooner if needed.  Call any questions or concerns meantime.  She agrees this plan has no further questions or concerns.  Celesta Gentile, DPM

## 2017-05-06 ENCOUNTER — Telehealth: Payer: Self-pay | Admitting: *Deleted

## 2017-05-06 NOTE — Telephone Encounter (Signed)
"  I was just wondering what his schedule looks like to do surgery on my left foot.  I am not allowed to be out of work 12 weeks consecutively without losing my job.  So, I would like to do it as soon as possible at the [facility] on [address].  Please call me back.

## 2017-05-08 DIAGNOSIS — H5213 Myopia, bilateral: Secondary | ICD-10-CM | POA: Diagnosis not present

## 2017-05-08 DIAGNOSIS — H524 Presbyopia: Secondary | ICD-10-CM | POA: Diagnosis not present

## 2017-05-08 DIAGNOSIS — H52223 Regular astigmatism, bilateral: Secondary | ICD-10-CM | POA: Diagnosis not present

## 2017-05-09 NOTE — Telephone Encounter (Signed)
I attempted to return her call.  I left her a message to call me back. 

## 2017-05-10 DIAGNOSIS — F4323 Adjustment disorder with mixed anxiety and depressed mood: Secondary | ICD-10-CM | POA: Diagnosis not present

## 2017-05-13 ENCOUNTER — Ambulatory Visit (INDEPENDENT_AMBULATORY_CARE_PROVIDER_SITE_OTHER): Payer: 59 | Admitting: Podiatry

## 2017-05-13 ENCOUNTER — Ambulatory Visit (INDEPENDENT_AMBULATORY_CARE_PROVIDER_SITE_OTHER): Payer: 59

## 2017-05-13 DIAGNOSIS — M2012 Hallux valgus (acquired), left foot: Secondary | ICD-10-CM

## 2017-05-13 DIAGNOSIS — M79676 Pain in unspecified toe(s): Secondary | ICD-10-CM

## 2017-05-14 NOTE — Progress Notes (Addendum)
Subjective: Sarah Ballard is a 62 y.o. is seen today in office s/p left foot bunionectomy  preformed on 04/17/2017.  She states that overall she is doing well.  She says the numbness in the leg is getting better.  She states that it is much better to the leg and only start of the ankle and the top of the foot.  She has been in the cam boot.  She has been getting the area wet for short amount of time.  She denies any increase in swelling and she denies any redness or drainage and she states the area is healing better.  She has not take any pain medicine.  She states that she still has to be up on her feet quite a bit to help take care of her dad as he is recently had 2 falls.  She has been contemplating her job.  Denies any systemic complaints such as fevers, chills, nausea, vomiting. No calf pain, chest pain, shortness of breath.   Objective: General: No acute distress, AAOx3  DP/PT pulses palpable 2/4, CRT < 3 sec to all digits.  Protective sensation intact. Motor function intact.  Left foot: Incision is well coapted without any evidence of dehiscence and sutures ends are intact.  There is no surrounding erythema today there is no ascending cellulitis.  There is no fluctuance or crepitus.  There is no malodor.  There is no clinical signs of infection noted.  The toes and rectus position.  There is no pain or crepitation restriction first MPJ range of motion.  Mild deviation of the hallux in an abducted position however minimal.  He has had appears to be doing well.  No other areas of tenderness to bilateral lower extremities.  No other open lesions or pre-ulcerative lesions.  No pain with calf compression, swelling, warmth, erythema.   Assessment and Plan:  Status post left foot [place] bunionectomy doing well with no complications   -Treatment options discussed including all alternatives, risks, and complications -Incision is healing well there is no signs of infection noted.  Antibiotic ointment  was applied followed by a bandage.  Also put her into a splint to help hold the toe in a rectus position during healing.  I discussed with the toe is going to drift somewhat laterally given the position of the other digits we will do the splint in order to help hold the toe in a rectus position. -Continue cam boot.  Limit weightbearing.   -Ice and elevation. -Numbness is improving.  We will continue to monitor. -Gentle range of motion exercises of the first MPJ-she has been doing this some. -Monitor for any clinical signs or symptoms of infection and directed to call the office immediately should any occur or go to the ER. -Follow-up in 2 weeks or sooner if needed.  Call any questions or concerns meantime.  She agrees this plan has no further questions or concerns.  We will likely start physical therapy next appointment.  Celesta Gentile, DPM  *Her disability is for 6 weeks postop. She will likely need to be out longer as she does a lot of walking and standing at work and I would like for her to be in a shoe full time before returning to work. We will extend her disability for an additional 6 weeks from the current end date.

## 2017-05-15 MED FILL — IRBESARTAN 150 MG TABLET: 150 | 90 days supply | Qty: 90 | Fill #2

## 2017-05-21 DIAGNOSIS — F4323 Adjustment disorder with mixed anxiety and depressed mood: Secondary | ICD-10-CM | POA: Diagnosis not present

## 2017-05-22 NOTE — Telephone Encounter (Signed)
I am returning your call.  I tried to call you back a couple of weeks ago.  "I am so sorry.  I should have called you back.  I spoke with Dr. Amalia Hailey and we decided I should wait to have the other foot done since I'm having problems with this one.  It's still numb and I am still wearing the boot."  Call if and when you would like to reschedule.

## 2017-05-23 MED FILL — ALPRAZolam 0.5 MG TABS: 0.5 | 20 days supply | Qty: 60 | Fill #3

## 2017-05-27 ENCOUNTER — Ambulatory Visit (INDEPENDENT_AMBULATORY_CARE_PROVIDER_SITE_OTHER): Payer: 59 | Admitting: Podiatry

## 2017-05-27 ENCOUNTER — Encounter: Payer: Self-pay | Admitting: Podiatry

## 2017-05-27 ENCOUNTER — Ambulatory Visit (INDEPENDENT_AMBULATORY_CARE_PROVIDER_SITE_OTHER): Payer: 59

## 2017-05-27 DIAGNOSIS — M2012 Hallux valgus (acquired), left foot: Secondary | ICD-10-CM

## 2017-05-27 DIAGNOSIS — M79676 Pain in unspecified toe(s): Secondary | ICD-10-CM

## 2017-05-29 ENCOUNTER — Encounter: Payer: Self-pay | Admitting: Podiatry

## 2017-05-29 NOTE — Progress Notes (Signed)
Subjective: Sarah Ballard is a 62 y.o. is seen today in office s/p left foot bunionectomy  preformed on 04/17/2017. She states that she is doing well.  She still has some numbness in the inside aspect of her foot but the leg is much improved.  She has no pain in the surgical site.  She has been wearing the bunion splint but she states that this is not helping actually causes some irritation when she wears it.  She denies any recent injury or trauma.  After looking her foot today she states that she is happy with the results.  She states that "I was just on it too much the first week taking care of my dad".  She does not take any pain medication. Denies any systemic complaints such as fevers, chills, nausea, vomiting. No calf pain, chest pain, shortness of breath.   Objective: General: No acute distress, AAOx3  DP/PT pulses palpable 2/4, CRT < 3 sec to all digits.  Protective sensation intact. Motor function intact.  Left foot: Incision is well coapted without any evidence of dehiscence and scar is well formed.  There is no surrounding erythema today there is no ascending cellulitis.  There is no fluctuance or crepitus.  There is no malodor.  There is no clinical signs of infection noted. There is no pain or crepitation restriction first MPJ range of motion.  Mild deviation of the hallux in an abducted position however this is unchanged.  He has had appears to be doing well.  No other areas of tenderness to bilateral lower extremities.  No other open lesions or pre-ulcerative lesions.  No pain with calf compression, swelling, warmth, erythema.   Assessment and Plan:  Status post left foot Austin bunionectomy   -Treatment options discussed including all alternatives, risks, and complications -X-rays were obtained and reviewed.  No evidence of acute fracture.  Hardware intact.  Increased consolidation across the osteotomy site. -At this time we will start physical therapy prescription was written for  physical therapy.  [facility].  Discussed range of motion exercises at home. -Transition to surgical shoe which was dispensed today. -Ice and elevation. -Monitor for any clinical signs or symptoms of infection and directed to call the office immediately should any occur or go to the ER. -Follow-up in 2 weeks or sooner if needed.  Call any questions or concerns meantime.  She agrees this plan has no further questions or concerns.  We will likely start physical therapy next appointment.  Celesta Gentile, DPM  *Will extend disability for 6 weeks.

## 2017-05-31 NOTE — Progress Notes (Signed)
DOS 12.19.18 Left foot surgical correction of bunion deformity w screw fixation

## 2017-06-03 DIAGNOSIS — F4323 Adjustment disorder with mixed anxiety and depressed mood: Secondary | ICD-10-CM | POA: Diagnosis not present

## 2017-06-03 MED FILL — OXYBUTYNIN CL ER 10 MG TAB: 10 | 90 days supply | Qty: 90 | Fill #1

## 2017-06-03 MED FILL — ESCITALOPRAM 20 MG TABLET: 20 | 90 days supply | Qty: 90 | Fill #3

## 2017-06-03 MED FILL — LEVOTHYROXINE 125 MCG TAB: 125 | 90 days supply | Qty: 90 | Fill #2

## 2017-06-04 DIAGNOSIS — R269 Unspecified abnormalities of gait and mobility: Secondary | ICD-10-CM | POA: Diagnosis not present

## 2017-06-04 DIAGNOSIS — M79675 Pain in left toe(s): Secondary | ICD-10-CM | POA: Diagnosis not present

## 2017-06-04 DIAGNOSIS — M6281 Muscle weakness (generalized): Secondary | ICD-10-CM | POA: Diagnosis not present

## 2017-06-04 DIAGNOSIS — M25475 Effusion, left foot: Secondary | ICD-10-CM | POA: Diagnosis not present

## 2017-06-11 DIAGNOSIS — R269 Unspecified abnormalities of gait and mobility: Secondary | ICD-10-CM | POA: Diagnosis not present

## 2017-06-11 DIAGNOSIS — M79675 Pain in left toe(s): Secondary | ICD-10-CM | POA: Diagnosis not present

## 2017-06-11 DIAGNOSIS — M25475 Effusion, left foot: Secondary | ICD-10-CM | POA: Diagnosis not present

## 2017-06-11 DIAGNOSIS — M6281 Muscle weakness (generalized): Secondary | ICD-10-CM | POA: Diagnosis not present

## 2017-06-12 DIAGNOSIS — Z6826 Body mass index (BMI) 26.0-26.9, adult: Secondary | ICD-10-CM | POA: Diagnosis not present

## 2017-06-12 DIAGNOSIS — R05 Cough: Secondary | ICD-10-CM | POA: Diagnosis not present

## 2017-06-12 DIAGNOSIS — B370 Candidal stomatitis: Secondary | ICD-10-CM | POA: Diagnosis not present

## 2017-06-12 DIAGNOSIS — J209 Acute bronchitis, unspecified: Secondary | ICD-10-CM | POA: Diagnosis not present

## 2017-06-12 DIAGNOSIS — J019 Acute sinusitis, unspecified: Secondary | ICD-10-CM | POA: Diagnosis not present

## 2017-06-12 DIAGNOSIS — J111 Influenza due to unidentified influenza virus with other respiratory manifestations: Secondary | ICD-10-CM | POA: Diagnosis not present

## 2017-06-12 MED FILL — DOXYCYCLINE HYCLATE 100 MG: 100 | 7 days supply | Qty: 14 | Fill #0

## 2017-06-12 MED FILL — PROAIR HFA 90 MCG INHALER: 108 (90 BAS | 17 days supply | Qty: 9 | Fill #0

## 2017-06-12 MED FILL — predniSONE 10 MG TABS: 10 | 6 days supply | Qty: 21 | Fill #0

## 2017-06-12 MED FILL — CLOTRIMAZOLE 10 MG TROCHE: 10 | 7 days supply | Qty: 35 | Fill #0

## 2017-06-13 DIAGNOSIS — F4323 Adjustment disorder with mixed anxiety and depressed mood: Secondary | ICD-10-CM | POA: Diagnosis not present

## 2017-06-14 DIAGNOSIS — M6281 Muscle weakness (generalized): Secondary | ICD-10-CM | POA: Diagnosis not present

## 2017-06-14 DIAGNOSIS — M25475 Effusion, left foot: Secondary | ICD-10-CM | POA: Diagnosis not present

## 2017-06-14 DIAGNOSIS — R269 Unspecified abnormalities of gait and mobility: Secondary | ICD-10-CM | POA: Diagnosis not present

## 2017-06-14 DIAGNOSIS — M79675 Pain in left toe(s): Secondary | ICD-10-CM | POA: Diagnosis not present

## 2017-06-17 ENCOUNTER — Ambulatory Visit (INDEPENDENT_AMBULATORY_CARE_PROVIDER_SITE_OTHER): Payer: 59

## 2017-06-17 ENCOUNTER — Encounter: Payer: Self-pay | Admitting: Podiatry

## 2017-06-17 ENCOUNTER — Ambulatory Visit (INDEPENDENT_AMBULATORY_CARE_PROVIDER_SITE_OTHER): Payer: 59 | Admitting: Podiatry

## 2017-06-17 DIAGNOSIS — M2012 Hallux valgus (acquired), left foot: Secondary | ICD-10-CM | POA: Diagnosis not present

## 2017-06-18 DIAGNOSIS — M6281 Muscle weakness (generalized): Secondary | ICD-10-CM | POA: Diagnosis not present

## 2017-06-18 DIAGNOSIS — M25475 Effusion, left foot: Secondary | ICD-10-CM | POA: Diagnosis not present

## 2017-06-18 DIAGNOSIS — M79675 Pain in left toe(s): Secondary | ICD-10-CM | POA: Diagnosis not present

## 2017-06-18 DIAGNOSIS — R269 Unspecified abnormalities of gait and mobility: Secondary | ICD-10-CM | POA: Diagnosis not present

## 2017-06-19 NOTE — Progress Notes (Signed)
Subjective: Sarah Ballard is a 62 y.o. is seen today in office s/p left foot bunionectomy  preformed on 04/17/2017.  She says overall she is doing better.  She still has some numbness on the incision site but overall this is getting better.  She states that she is having some difficulty to bend the big toe which is been going to therapy and this is been helping.  She again states today that she thinks that she may have missed the surgery of the first couple weeks as she was trying to get ready for Christmas as well as take care of her dad and she is on her feet quite a bit.  She has not had any pain in the surgical site and she is very happy with this.  She states today again that she is very thankful for the surgery and she is very happy with the result so far. She does not take any pain medication. Denies any systemic complaints such as fevers, chills, nausea, vomiting. No calf pain, chest pain, shortness of breath.   Objective: General: No acute distress, AAOx3  DP/PT pulses palpable 2/4, CRT < 3 sec to all digits.  Protective sensation intact. Motor function intact.  Left foot: Incision is well coapted without any evidence of dehiscence and scar is well formed.  There is trace edema but there is no significant erythema there is no ascending cellulitis.  There is no fluctuation or crepitation.  No increase in warmth.  No clinical signs of infection is noted.  There is mild reduction first MTP range of motion however this does appear to be clinically improved compared to what it has been.  There is no pain or crepitation with MPJ range of motion. Mild deviation of the hallux in an abducted position however this is unchanged. No other areas of tenderness to bilateral lower extremities.  No other open lesions or pre-ulcerative lesions.  No pain with calf compression, swelling, warmth, erythema.   Assessment and Plan:  Status post left foot Austin bunionectomy   -Treatment options discussed including  all alternatives, risks, and complications -X-rays were obtained and reviewed.  No evidence of acute fracture.  Hardware intact.  Increased consolidation across the osteotomy site.  Abduction of the hallux.  Reviewed the x-rays with her. -At this time she displays her to transition to regular shoe as able.  Continue physical therapy.  I want her to continue the surgical shoe until she get back to her regular shoe and not go barefoot. -Ice and elevation. -Monitor for any clinical signs or symptoms of infection and directed to call the office immediately should any occur or go to the ER. -Follow-up as scheduled or sooner if needed.  Call any questions or concerns meantime.  She agrees this plan has no further questions or concerns.    Celesta Gentile, DPM

## 2017-06-20 DIAGNOSIS — M25475 Effusion, left foot: Secondary | ICD-10-CM | POA: Diagnosis not present

## 2017-06-20 DIAGNOSIS — R269 Unspecified abnormalities of gait and mobility: Secondary | ICD-10-CM | POA: Diagnosis not present

## 2017-06-20 DIAGNOSIS — M6281 Muscle weakness (generalized): Secondary | ICD-10-CM | POA: Diagnosis not present

## 2017-06-20 DIAGNOSIS — M79675 Pain in left toe(s): Secondary | ICD-10-CM | POA: Diagnosis not present

## 2017-06-21 MED FILL — ALPRAZolam 0.5 MG TABS: 0.5 | 20 days supply | Qty: 60 | Fill #0

## 2017-06-24 DIAGNOSIS — F4323 Adjustment disorder with mixed anxiety and depressed mood: Secondary | ICD-10-CM | POA: Diagnosis not present

## 2017-06-25 DIAGNOSIS — R269 Unspecified abnormalities of gait and mobility: Secondary | ICD-10-CM | POA: Diagnosis not present

## 2017-06-25 DIAGNOSIS — M6281 Muscle weakness (generalized): Secondary | ICD-10-CM | POA: Diagnosis not present

## 2017-06-25 DIAGNOSIS — M79675 Pain in left toe(s): Secondary | ICD-10-CM | POA: Diagnosis not present

## 2017-06-25 DIAGNOSIS — M25475 Effusion, left foot: Secondary | ICD-10-CM | POA: Diagnosis not present

## 2017-06-26 ENCOUNTER — Encounter: Payer: Self-pay | Admitting: Cardiovascular Disease

## 2017-06-26 ENCOUNTER — Ambulatory Visit: Payer: 59 | Admitting: Cardiovascular Disease

## 2017-06-26 VITALS — BP 110/76 | HR 77 | Ht 65.0 in | Wt 167.0 lb

## 2017-06-26 DIAGNOSIS — I2510 Atherosclerotic heart disease of native coronary artery without angina pectoris: Secondary | ICD-10-CM | POA: Diagnosis not present

## 2017-06-26 DIAGNOSIS — I10 Essential (primary) hypertension: Secondary | ICD-10-CM | POA: Diagnosis not present

## 2017-06-26 DIAGNOSIS — E782 Mixed hyperlipidemia: Secondary | ICD-10-CM | POA: Diagnosis not present

## 2017-06-26 MED ORDER — IRBESARTAN 75 MG PO TABS: 75.0000 mg | ORAL_TABLET | Freq: Every day | ORAL | 3 refills | Status: DC

## 2017-06-26 NOTE — Progress Notes (Signed)
Sarah Ballard Date of Birth  Jul 29, 1955 [facility] [address]    [place], [place]  [address] [phone]  Fax  [phone]  Problem List 1. Coronary artery disease-  2. anxiety 3. Hyperlipidemia 4. Hypertension 5. Hypothyroidism 6. Chronic back pain     The 62 yo  female with a history of coronary artery disease. She's status post PTCA and stenting of her left anterior descending artery. She also had [place] Balloon procedure and reexpansion of that stent in January 2010.  She was admitted recently to the hospital for chest pain an dizziness and cath revealed no significant irregularities.  He is walking twice a week. She's not having episodes of angina with walking. 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  October 27, 2012:  Sarah Ballard is doing well from a cardiac standpoint.   She is having some back pain - needs to have a back injection but does not want to stop the Plavix.    October 27, 2013:  Sarah Ballard is doing well.  She denies any chest pain.  Bumping into lots of things and has bruising on her legs.   Her potassium was low at her last check.    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  Asked about stopping the plavix   Jan. 24, 2017:  Sarah Ballard is doing ok Has been losing weight.   Lots of stress with taking care of her father and her divorce and her son .  No CP .     November 17, 2015:  Doing ok.  Sleepy .  Had to work late last night . No CP,  Breathing is good Lots of anxiety with work stress.   July 12, 2016:  Doing ok from a cardiac standpoint Has gained some weight .  Rare episodes of CP - she thinks its due to stress or indigestion,   its not related to exertion  requests a refill of her NTG  Is trying to move  Is having lots of artheritis   Pain  Is falling lots at work   Nov. 14,  2018  Has  Moved since I last saw her. Is having some chest pain  Thinks its GERD or anxeity.  Lots of stress  Lots of stress related to the move.    The CP are atypical , do not feel like her previouis angina Takes NTG - does not really relieve the CP Finds [employer] work   Is planning on having bunion surgery at the end of this year Will be at low risk Will need to hold Plavix for 5 days prior to surgery .    Father has gained some fluid .   Feb. 27, 2019 Doing well Is very appreciative that I sent her to see Ezekiel Slocumb for her anxiety .  Father recently had the flu  Sarah Ballard has had a cough / cold.   Developed quickly. Has been seen by Dr. 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    Current Outpatient Medications on File Prior to Visit  Medication Sig Dispense Refill  . acetaminophen (TYLENOL) 500 MG tablet Take 500 mg by mouth every 6 (six) hours as needed for headache (and pain).     Marland Kitchen ALPRAZolam (XANAX) 0.5 MG tablet Take 1-3 tablets by  mouth daily as needed for anxiety and sleep    . Calcium Carbonate-Vitamin D (CALTRATE 600+D) 600-400 MG-UNIT per tablet Take 1 tablet daily by mouth.     . cephALEXin (KEFLEX) 500 MG capsule Take 1 capsule (500 mg total) by mouth 3 (three) times daily. 21 capsule 0  . clopidogrel (PLAVIX) 75 MG tablet TAKE 1 TABLET BY MOUTH DAILY 90 tablet 1  . cyclobenzaprine (FLEXERIL) 10 MG tablet Take 10 mg as directed by mouth. For muscle spasms    . docusate sodium (COLACE) 100 MG capsule Take 100 mg by mouth as needed (for constipation).     Marland Kitchen escitalopram (LEXAPRO) 20 MG tablet Take 1 tablet by mouth daily. Reported on 10/11/2015  3  . estradiol (ESTRACE) 0.1 MG/GM vaginal cream Pea size amount to urethra 3 times a week at HS 42.5 g 3  . fluticasone (FLONASE) 50 MCG/ACT nasal spray Place 2 sprays into both nostrils daily as needed for allergies or rhinitis.    Marland Kitchen glucosamine-chondroitin 500-400 MG tablet Take 2 tablets daily by  mouth.     Marland Kitchen HYDROcodone-acetaminophen (NORCO/VICODIN) 5-325 MG tablet Take 0.5-1 tablets 2 (two) times daily as needed by mouth for pain.  0  . ibuprofen (ADVIL,MOTRIN) 200 MG tablet Take 600 mg by mouth every 6 (six) hours as needed. For pain    . irbesartan (AVAPRO) 150 MG tablet Take 1 tablet (150 mg total) by mouth daily. 90 tablet 2  . levothyroxine (SYNTHROID, LEVOTHROID) 125 MCG tablet Take 125 mcg by mouth daily.     . magnesium 30 MG tablet Take 60 mg daily by mouth. Reported on 07/08/2015    . metoprolol tartrate (LOPRESSOR) 25 MG tablet TAKE 1/2 TABLET BY MOUTH 2 TIMES DAILY. 90 tablet 3  . nitroGLYCERIN (NITROSTAT) 0.4 MG SL tablet Place 1 tablet (0.4 mg total) under the tongue every 5 (five) minutes as needed. For chest pain 25 tablet 6  . NON FORMULARY [facility]  Scar Cream -  Verapamil 10%, Pentoxifylline 5% Apply 1-2 grams to affected area 3-4 times daily Qty. 120 gm 3 refills    . omega-3 acid ethyl esters (LOVAZA) 1 G capsule Take 2 g by mouth 2 (two) times daily.      Marland Kitchen oxybutynin (DITROPAN-XL) 10 MG 24 hr tablet Take 10 mg by mouth daily.  11  . oxyCODONE-acetaminophen (ROXICET) 5-325 MG tablet Take 1 tablet by mouth every 6 (six) hours as needed for severe pain. 20 tablet 0  . polyethylene glycol (MIRALAX / GLYCOLAX) packet Take 17 g by mouth daily as needed. For constipation    . promethazine (PHENERGAN) 25 MG tablet Take 1 tablet (25 mg total) by mouth every 8 (eight) hours as needed for nausea or vomiting. 20 tablet 0  . rosuvastatin (CRESTOR) 20 MG tablet Take 1 tablet (20 mg total) by mouth daily. 90 tablet 3  . Wheat Dextrin (BENEFIBER PO) Take by mouth daily. Pt unsure of dosage     No current facility-administered medications on file prior to visit.     Allergies  Allergen Reactions  . Latex     itching  . Lisinopril Cough  . Tramadol Hcl     REACTION: nausea, vomiting  . Ultram [Tramadol]     Nausea and vomitting     Past Medical History:   Diagnosis Date  . Angina pectoris ([place]) 2005   with stent placement Anterior decscending  . Anxiety   . Arthritis   . Bone spur  Right Hip  . CAD (coronary artery disease) 2005   s/p PCI LAD and cutting balloon angioplasty  with stent reexpansion 04/2008  . Colon polyps   . Degenerative disc disease   . Depression   . GERD (gastroesophageal reflux disease)   . Hypercholesterolemia   . Hyperlipemia   . Hypertension   . Hypothyroidism   . MI (myocardial infarction) ([place]) 2005   angio with stent  . Positive PPD    neg. CXR, treated with INH for 1 yr.  about 43  . UTI (urinary tract infection) 05/31/15    Past Surgical History:  Procedure Laterality Date  . COLONOSCOPY    . CORONARY ANGIOPLASTY  2009  . CORONARY ANGIOPLASTY WITH STENT PLACEMENT  2005  . HEMORRHOID SURGERY  06/2015   Removal   . LAPAROSCOPY     for endometriosis  . LEFT HEART CATHETERIZATION WITH CORONARY ANGIOGRAM Bilateral 06/27/2011   Procedure: LEFT HEART CATHETERIZATION WITH CORONARY ANGIOGRAM;  Surgeon: Burnell Blanks, MD;  Location[facility] CATH LAB;  Service: Cardiovascular;  Laterality: Bilateral;  . LUMBAR EPIDURAL INJECTION     many  . NERVE ROOT BLOCK     epidural steroids; multiple  . POLYPECTOMY    . ruptured disc  2007   with epidural   . TONGUE BIOPSY     keratin accumulation - Benign   . TONSILLECTOMY    . TUBAL LIGATION      Social History   Tobacco Use  Smoking Status Never Smoker  Smokeless Tobacco Never Used    Social History   Substance and Sexual Activity  Alcohol Use Yes  . Alcohol/week: 0.6 oz  . Types: 1 Glasses of wine per week   Comment: occ wine    Family History  Problem Relation Age of Onset  . Colon cancer Mother 25  . Stroke Mother   . Hypertension Mother   . Diabetes Mother   . Rheum arthritis Mother   . Atrial fibrillation Mother   . Heart attack Father        x2  . Diabetes Father   . Kidney disease Father   . Heart disease Father   .  Stroke Maternal Grandmother   . Colon cancer Paternal Grandmother   . Diabetes Paternal Grandmother   . Esophageal cancer Cousin 30    Reviw of Systems:  Reviewed in the HPI.  All other systems are negative.  Physical Exam: Blood pressure 110/76, pulse 77, height 5\' 5"  (1.651 m), weight 167 lb (75.8 kg), last menstrual period 06/29/2006, SpO2 96 %.  GEN:  Well nourished, well developed in no acute distress HEENT: Normal NECK: No JVD; No carotid bruits LYMPHATICS: No lymphadenopathy CARDIAC: RR, no murmurs, rubs, gallops RESPIRATORY:  Clear to auscultation without rales, wheezing or rhonchi  ABDOMEN: Soft, non-tender, non-distended MUSCULOSKELETAL:  No edema; No deformity  SKIN: Warm and dry NEUROLOGIC:  Alert and oriented x 3   ECG :  -.   Assessment / Plan:   1. Coronary artery disease-  Doing well, no angian   2. Anxiety - doing much better - sees Ezekiel Slocumb who has helped her quite a bit   3. Hyperlipidemia -   her fasting labs when I  see her again in 6 months.  4. Hypertension  -  BP has been low. Reduce Irbesartan to 75 mg a day   5. Hypothyroidism 6. Chronic back pain 7. Weight gain :     8. Bunion surgery  - still  has some left foot numbness from the surgery    .    Body mass index is 27.79 kg/m.   Mertie Moores, MD  06/26/2017 10:44 AM    [address],  [address] [place], [place]  [address] Pager [phone] Phone: [phone]; Fax: [phone]

## 2017-06-26 NOTE — Patient Instructions (Addendum)
Medication Instructions:  Your physician has recommended you make the following change in your medication:    DECREASE Irbesartan (Avapro) to 75 mg daily   Labwork: Your physician recommends that you return for lab work in: 6 months on the day of or a few days before your office visit with Dr. Acie Fredrickson.  You will need to FAST for this appointment - nothing to eat or drink after midnight the night before except water.   Testing/Procedures: None Ordered   Follow-Up: Your physician wants you to follow-up in: 6 months with Dr. Acie Fredrickson.  You will receive a reminder letter in the mail two months in advance. If you don't receive a letter, please call our office to schedule the follow-up appointment.   If you need a refill on your cardiac medications before your next appointment, please call your pharmacy.   Thank you for choosing [facility]! Christen Bame, RN [phone]

## 2017-06-28 DIAGNOSIS — M79675 Pain in left toe(s): Secondary | ICD-10-CM | POA: Diagnosis not present

## 2017-06-28 DIAGNOSIS — M6281 Muscle weakness (generalized): Secondary | ICD-10-CM | POA: Diagnosis not present

## 2017-06-28 DIAGNOSIS — M25475 Effusion, left foot: Secondary | ICD-10-CM | POA: Diagnosis not present

## 2017-06-28 DIAGNOSIS — R269 Unspecified abnormalities of gait and mobility: Secondary | ICD-10-CM | POA: Diagnosis not present

## 2017-07-02 DIAGNOSIS — M6281 Muscle weakness (generalized): Secondary | ICD-10-CM | POA: Diagnosis not present

## 2017-07-02 DIAGNOSIS — M79675 Pain in left toe(s): Secondary | ICD-10-CM | POA: Diagnosis not present

## 2017-07-02 DIAGNOSIS — F4323 Adjustment disorder with mixed anxiety and depressed mood: Secondary | ICD-10-CM | POA: Diagnosis not present

## 2017-07-02 DIAGNOSIS — M25475 Effusion, left foot: Secondary | ICD-10-CM | POA: Diagnosis not present

## 2017-07-02 DIAGNOSIS — R269 Unspecified abnormalities of gait and mobility: Secondary | ICD-10-CM | POA: Diagnosis not present

## 2017-07-05 DIAGNOSIS — M25475 Effusion, left foot: Secondary | ICD-10-CM | POA: Diagnosis not present

## 2017-07-05 DIAGNOSIS — M6281 Muscle weakness (generalized): Secondary | ICD-10-CM | POA: Diagnosis not present

## 2017-07-05 DIAGNOSIS — R269 Unspecified abnormalities of gait and mobility: Secondary | ICD-10-CM | POA: Diagnosis not present

## 2017-07-05 DIAGNOSIS — M79675 Pain in left toe(s): Secondary | ICD-10-CM | POA: Diagnosis not present

## 2017-07-08 ENCOUNTER — Ambulatory Visit (INDEPENDENT_AMBULATORY_CARE_PROVIDER_SITE_OTHER): Payer: 59

## 2017-07-08 ENCOUNTER — Ambulatory Visit (INDEPENDENT_AMBULATORY_CARE_PROVIDER_SITE_OTHER): Payer: 59 | Admitting: Podiatry

## 2017-07-08 ENCOUNTER — Encounter: Payer: Self-pay | Admitting: Podiatry

## 2017-07-08 DIAGNOSIS — M2012 Hallux valgus (acquired), left foot: Secondary | ICD-10-CM

## 2017-07-08 DIAGNOSIS — Z9889 Other specified postprocedural states: Secondary | ICD-10-CM

## 2017-07-10 DIAGNOSIS — M6281 Muscle weakness (generalized): Secondary | ICD-10-CM | POA: Diagnosis not present

## 2017-07-10 DIAGNOSIS — R269 Unspecified abnormalities of gait and mobility: Secondary | ICD-10-CM | POA: Diagnosis not present

## 2017-07-10 DIAGNOSIS — M79675 Pain in left toe(s): Secondary | ICD-10-CM | POA: Diagnosis not present

## 2017-07-10 DIAGNOSIS — M25475 Effusion, left foot: Secondary | ICD-10-CM | POA: Diagnosis not present

## 2017-07-11 DIAGNOSIS — R82998 Other abnormal findings in urine: Secondary | ICD-10-CM | POA: Diagnosis not present

## 2017-07-11 DIAGNOSIS — I10 Essential (primary) hypertension: Secondary | ICD-10-CM | POA: Diagnosis not present

## 2017-07-11 DIAGNOSIS — F4323 Adjustment disorder with mixed anxiety and depressed mood: Secondary | ICD-10-CM | POA: Diagnosis not present

## 2017-07-11 DIAGNOSIS — E038 Other specified hypothyroidism: Secondary | ICD-10-CM | POA: Diagnosis not present

## 2017-07-12 DIAGNOSIS — M79675 Pain in left toe(s): Secondary | ICD-10-CM | POA: Diagnosis not present

## 2017-07-12 DIAGNOSIS — R269 Unspecified abnormalities of gait and mobility: Secondary | ICD-10-CM | POA: Diagnosis not present

## 2017-07-12 DIAGNOSIS — M25475 Effusion, left foot: Secondary | ICD-10-CM | POA: Diagnosis not present

## 2017-07-12 DIAGNOSIS — M6281 Muscle weakness (generalized): Secondary | ICD-10-CM | POA: Diagnosis not present

## 2017-07-12 MED FILL — OMEGA-3 ETHYL ESTERS 1 GM C: 1 | 90 days supply | Qty: 360 | Fill #0

## 2017-07-13 NOTE — Progress Notes (Signed)
Subjective: Sarah Ballard is a 62 y.o. is seen today in office s/p left foot bunionectomy  preformed on 04/17/2017.  She states that she is doing well she is having no pain.  She still has numbness to the incision site.  She is been doing physical therapy which is been helping.  She is transitioning to a regular sneaker.  She denies any recent injury or trauma since I have last saw her.  She states that she feels that she has the surgery of the first week or so and otherwise taking longer for the numbness to go away.  Overall she states that she is very happy with the outcome of the surgery point.  She is also decides that she does not want to go back to her same job.  Before the surgery she discussed this with me that she was contemplating not going back to work.  She made the decision yesterday that she was not to return to her same job. She does not take any pain medication. Denies any systemic complaints such as fevers, chills, nausea, vomiting. No calf pain, chest pain, shortness of breath.   Objective: General: No acute distress, AAOx3  DP/PT pulses palpable 2/4, CRT < 3 sec to all digits.  Protective sensation intact. Motor function intact.  Left foot: Incision is well coapted without any evidence of dehiscence and scar is well formed.  There is no tenderness palpation of the surgical site.  There is no pain with or crepitation with MPJ range of motion.  Unfortunately there is a mild recurrence of the hallux abductus but overall the actual bunion appears to be much improved.  There is minimal swelling.  There is no erythema or increase in warmth.  There is no clinical signs of infection present. No other open lesions or pre-ulcerative lesions.  No pain with calf compression, swelling, warmth, erythema.   Assessment and Plan:  Status post left foot Austin bunionectomy   -Treatment options discussed including all alternatives, risks, and complications -X-rays were obtained and reviewed.  No  evidence of acute fracture.  Hardware intact without any loosening.  There is increased  consolidation across the osteotomy site. -Continue physical therapy.  She has been making progress by going.  I want her to start to return back into a regular shoe.  We will continue physical therapy until she is back to full activity. -Continue ice to the area. -Follow-up in 3-4 weeks or sooner if needed.  Call any questions or concerns meantime.  Trula Slade DPM

## 2017-07-16 DIAGNOSIS — M25475 Effusion, left foot: Secondary | ICD-10-CM | POA: Diagnosis not present

## 2017-07-16 DIAGNOSIS — M6281 Muscle weakness (generalized): Secondary | ICD-10-CM | POA: Diagnosis not present

## 2017-07-16 DIAGNOSIS — M79675 Pain in left toe(s): Secondary | ICD-10-CM | POA: Diagnosis not present

## 2017-07-16 DIAGNOSIS — R269 Unspecified abnormalities of gait and mobility: Secondary | ICD-10-CM | POA: Diagnosis not present

## 2017-07-18 DIAGNOSIS — Z1389 Encounter for screening for other disorder: Secondary | ICD-10-CM | POA: Diagnosis not present

## 2017-07-18 DIAGNOSIS — Z23 Encounter for immunization: Secondary | ICD-10-CM | POA: Diagnosis not present

## 2017-07-18 DIAGNOSIS — F329 Major depressive disorder, single episode, unspecified: Secondary | ICD-10-CM | POA: Diagnosis not present

## 2017-07-18 DIAGNOSIS — I2510 Atherosclerotic heart disease of native coronary artery without angina pectoris: Secondary | ICD-10-CM | POA: Diagnosis not present

## 2017-07-18 DIAGNOSIS — M2010 Hallux valgus (acquired), unspecified foot: Secondary | ICD-10-CM | POA: Diagnosis not present

## 2017-07-18 DIAGNOSIS — M25475 Effusion, left foot: Secondary | ICD-10-CM | POA: Diagnosis not present

## 2017-07-18 DIAGNOSIS — I10 Essential (primary) hypertension: Secondary | ICD-10-CM | POA: Diagnosis not present

## 2017-07-18 DIAGNOSIS — R269 Unspecified abnormalities of gait and mobility: Secondary | ICD-10-CM | POA: Diagnosis not present

## 2017-07-18 DIAGNOSIS — E038 Other specified hypothyroidism: Secondary | ICD-10-CM | POA: Diagnosis not present

## 2017-07-18 DIAGNOSIS — Z0000 Encounter for general adult medical examination without abnormal findings: Secondary | ICD-10-CM | POA: Diagnosis not present

## 2017-07-18 DIAGNOSIS — M6281 Muscle weakness (generalized): Secondary | ICD-10-CM | POA: Diagnosis not present

## 2017-07-18 DIAGNOSIS — E7849 Other hyperlipidemia: Secondary | ICD-10-CM | POA: Diagnosis not present

## 2017-07-18 DIAGNOSIS — F419 Anxiety disorder, unspecified: Secondary | ICD-10-CM | POA: Diagnosis not present

## 2017-07-18 DIAGNOSIS — M79675 Pain in left toe(s): Secondary | ICD-10-CM | POA: Diagnosis not present

## 2017-07-18 DIAGNOSIS — D692 Other nonthrombocytopenic purpura: Secondary | ICD-10-CM | POA: Diagnosis not present

## 2017-07-19 ENCOUNTER — Other Ambulatory Visit: Payer: Self-pay | Admitting: Cardiovascular Disease

## 2017-07-19 DIAGNOSIS — F4323 Adjustment disorder with mixed anxiety and depressed mood: Secondary | ICD-10-CM | POA: Diagnosis not present

## 2017-07-19 MED FILL — ROSUVASTATIN CALCIUM 20 MG: 20 | 90 days supply | Qty: 90 | Fill #0

## 2017-07-19 MED FILL — METOPROLOL TARTRATE 25 MG T: 25 | 90 days supply | Qty: 90 | Fill #2

## 2017-07-19 MED FILL — CLOPIDOGREL 75 MG TABLET: 75 | 90 days supply | Qty: 90 | Fill #0

## 2017-07-19 MED FILL — ALPRAZolam 0.5 MG TABS: 0.5 | 20 days supply | Qty: 60 | Fill #1

## 2017-07-24 DIAGNOSIS — M25475 Effusion, left foot: Secondary | ICD-10-CM | POA: Diagnosis not present

## 2017-07-24 DIAGNOSIS — M79675 Pain in left toe(s): Secondary | ICD-10-CM | POA: Diagnosis not present

## 2017-07-24 DIAGNOSIS — R269 Unspecified abnormalities of gait and mobility: Secondary | ICD-10-CM | POA: Diagnosis not present

## 2017-07-24 DIAGNOSIS — M6281 Muscle weakness (generalized): Secondary | ICD-10-CM | POA: Diagnosis not present

## 2017-07-26 DIAGNOSIS — M6281 Muscle weakness (generalized): Secondary | ICD-10-CM | POA: Diagnosis not present

## 2017-07-26 DIAGNOSIS — R269 Unspecified abnormalities of gait and mobility: Secondary | ICD-10-CM | POA: Diagnosis not present

## 2017-07-26 DIAGNOSIS — M25475 Effusion, left foot: Secondary | ICD-10-CM | POA: Diagnosis not present

## 2017-07-26 DIAGNOSIS — Z1212 Encounter for screening for malignant neoplasm of rectum: Secondary | ICD-10-CM | POA: Diagnosis not present

## 2017-07-26 DIAGNOSIS — M79675 Pain in left toe(s): Secondary | ICD-10-CM | POA: Diagnosis not present

## 2017-07-29 DIAGNOSIS — M25475 Effusion, left foot: Secondary | ICD-10-CM | POA: Diagnosis not present

## 2017-07-29 DIAGNOSIS — R269 Unspecified abnormalities of gait and mobility: Secondary | ICD-10-CM | POA: Diagnosis not present

## 2017-07-29 DIAGNOSIS — M79675 Pain in left toe(s): Secondary | ICD-10-CM | POA: Diagnosis not present

## 2017-07-29 DIAGNOSIS — M6281 Muscle weakness (generalized): Secondary | ICD-10-CM | POA: Diagnosis not present

## 2017-07-31 DIAGNOSIS — R269 Unspecified abnormalities of gait and mobility: Secondary | ICD-10-CM | POA: Diagnosis not present

## 2017-07-31 DIAGNOSIS — M6281 Muscle weakness (generalized): Secondary | ICD-10-CM | POA: Diagnosis not present

## 2017-07-31 DIAGNOSIS — M79675 Pain in left toe(s): Secondary | ICD-10-CM | POA: Diagnosis not present

## 2017-07-31 DIAGNOSIS — M25475 Effusion, left foot: Secondary | ICD-10-CM | POA: Diagnosis not present

## 2017-08-05 ENCOUNTER — Ambulatory Visit (INDEPENDENT_AMBULATORY_CARE_PROVIDER_SITE_OTHER): Payer: 59 | Admitting: Podiatry

## 2017-08-05 ENCOUNTER — Encounter: Payer: Self-pay | Admitting: Podiatry

## 2017-08-05 DIAGNOSIS — M2012 Hallux valgus (acquired), left foot: Secondary | ICD-10-CM

## 2017-08-05 DIAGNOSIS — Z9889 Other specified postprocedural states: Secondary | ICD-10-CM

## 2017-08-07 ENCOUNTER — Encounter: Payer: Self-pay | Admitting: Podiatry

## 2017-08-08 NOTE — Progress Notes (Signed)
Subjective: Sarah Ballard is a 62 y.o. is seen today in office s/p left foot bunionectomy  preformed on 04/17/2017.  She states that she is doing better.  She still has some numbness on the incision but there is numbness in the interspace is getting better.  She is also finished physical therapy.  She says the physical therapy does help.  She is been able to wear a regular shoe.  She states that she is ready to be discharged from the postoperative care. She does not take any pain medication. Denies any systemic complaints such as fevers, chills, nausea, vomiting. No calf pain, chest pain, shortness of breath.   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  Objective: General: No acute distress, AAOx3  DP/PT pulses palpable 2/4, CRT < 3 sec to all digits.  Protective sensation intact. Motor function intact.  Left foot: Incision is well coapted without any evidence of dehiscence and scar is well formed.  There is no tenderness palpation of the surgical site.  There is no pain with or crepitation with MPJ range of motion.  Mild hallux abduction is present.  There is no tenderness palpation surgical site there is no signs of infection.  There is still some mild subjective numbness along the incision site but only interspace where she is having some numbness is improving. No other open lesions or pre-ulcerative lesions.  No pain with calf compression, swelling, warmth, erythema.   Assessment and Plan:  Status post left foot Austin bunionectomy   -Treatment options discussed including all alternatives, risks, and complications -At this time she is doing well from a surgical standpoint she is wearing a regular shoe.  She has no pain.  The numbness is improving.  She is also been discharged from physical therapy.  I want her to continue with home rehab exercises.  Continue to  ice and elevate a gradual increase activity level.  This will be discharged from postoperative care this is partially at her request.  However encouraged to call any questions or concerns or any change in symptoms.  Trula Slade DPM

## 2017-08-09 DIAGNOSIS — F4323 Adjustment disorder with mixed anxiety and depressed mood: Secondary | ICD-10-CM | POA: Diagnosis not present

## 2017-08-23 MED FILL — FLUTICASONE PROP 50 MCG SPR: 50 | 90 days supply | Qty: 48 | Fill #0

## 2017-08-23 MED FILL — ALPRAZolam 0.5 MG TABS: 0.5 | 20 days supply | Qty: 60 | Fill #2

## 2017-08-30 DIAGNOSIS — F4323 Adjustment disorder with mixed anxiety and depressed mood: Secondary | ICD-10-CM | POA: Diagnosis not present

## 2017-09-02 MED FILL — ESCITALOPRAM 20 MG TABLET: 20 | 90 days supply | Qty: 90 | Fill #0

## 2017-09-02 MED FILL — LEVOTHYROXINE 125 MCG TAB: 125 | 90 days supply | Qty: 90 | Fill #0

## 2017-09-02 MED FILL — OXYBUTYNIN CL ER 10 MG TAB: 10 | 90 days supply | Qty: 90 | Fill #2

## 2017-09-17 DIAGNOSIS — F4323 Adjustment disorder with mixed anxiety and depressed mood: Secondary | ICD-10-CM | POA: Diagnosis not present

## 2017-09-18 ENCOUNTER — Other Ambulatory Visit: Payer: Self-pay | Admitting: Nurse Practitioner

## 2017-09-18 ENCOUNTER — Telehealth: Payer: Self-pay | Admitting: Nurse Practitioner

## 2017-09-18 ENCOUNTER — Other Ambulatory Visit: Payer: Self-pay | Admitting: Cardiovascular Disease

## 2017-09-18 MED ORDER — IRBESARTAN 150 MG PO TABS: 150.0000 mg | ORAL_TABLET | Freq: Every day | ORAL | 3 refills | Status: DC

## 2017-09-18 MED FILL — IRBESARTAN 150 MG TABLET: 150 | 90 days supply | Qty: 90 | Fill #0

## 2017-09-18 MED FILL — ALPRAZolam 0.5 MG TABS: 0.5 | 20 days supply | Qty: 60 | Fill #3

## 2017-09-18 NOTE — Telephone Encounter (Signed)
Received cal from patient who requests to increase Irbesartan to 150 mg. She states Irbesartan was decreased at 06/26/17 ov due to low BP but since that time she has been monitoring BP regularly and it is now elevated at 242-683 mmHg systolic and 41'D mmHg diastolic. She states she does not feel as well with the higher BP. I reviewed with Dr. Acie Fredrickson who advised she may increase Irbesartan to 150 mg daily and continue to monitor BP. I advised her to call back with questions or concerns prior to next ov if needed. She verbalized understanding and was grateful for my help. Rx sent to [facility] as requested by patient.

## 2017-09-24 DIAGNOSIS — F4323 Adjustment disorder with mixed anxiety and depressed mood: Secondary | ICD-10-CM | POA: Diagnosis not present

## 2017-10-03 DIAGNOSIS — F4323 Adjustment disorder with mixed anxiety and depressed mood: Secondary | ICD-10-CM | POA: Diagnosis not present

## 2017-10-10 DIAGNOSIS — F419 Anxiety disorder, unspecified: Secondary | ICD-10-CM | POA: Diagnosis not present

## 2017-10-10 DIAGNOSIS — F329 Major depressive disorder, single episode, unspecified: Secondary | ICD-10-CM | POA: Diagnosis not present

## 2017-10-11 MED FILL — CLOPIDOGREL 75 MG TABLET: 75 | 90 days supply | Qty: 90 | Fill #1

## 2017-10-11 MED FILL — busPIRone HCL 5 MG TABS: 5 | 90 days supply | Qty: 180 | Fill #0 | Status: TO

## 2017-10-11 MED FILL — METOPROLOL TARTRATE 25 MG T: 25 | 90 days supply | Qty: 90 | Fill #3

## 2017-10-11 MED FILL — ROSUVASTATIN CALCIUM 20 MG: 20 | 90 days supply | Qty: 90 | Fill #1

## 2017-10-11 MED FILL — ESCITALOPRAM 10 MG TABLET: 10 | 35 days supply | Qty: 30 | Fill #0

## 2017-10-11 MED FILL — OMEGA-3 ETHYL ESTERS 1 GM C: 1 | 90 days supply | Qty: 360 | Fill #1

## 2017-10-11 MED FILL — BUPROPION HCL XL 150 MG TAB: 150 | 90 days supply | Qty: 90 | Fill #0

## 2017-10-11 MED FILL — CYCLOBENZAPRINE 10 MG TAB: 10 | 10 days supply | Qty: 30 | Fill #0

## 2017-10-14 MED FILL — AMOXICILLIN 875 MG TABLET: 875 | 10 days supply | Qty: 20 | Fill #0

## 2017-10-16 DIAGNOSIS — F4323 Adjustment disorder with mixed anxiety and depressed mood: Secondary | ICD-10-CM | POA: Diagnosis not present

## 2017-10-21 MED FILL — ALPRAZolam 0.5 MG TABS: 0.5 | 20 days supply | Qty: 60 | Fill #0

## 2017-10-22 ENCOUNTER — Ambulatory Visit: Payer: 59 | Admitting: Certified Nurse Midwife

## 2017-10-22 ENCOUNTER — Ambulatory Visit: Payer: 59 | Admitting: Nurse Practitioner

## 2017-10-24 DIAGNOSIS — F4323 Adjustment disorder with mixed anxiety and depressed mood: Secondary | ICD-10-CM | POA: Diagnosis not present

## 2017-10-25 DIAGNOSIS — M5416 Radiculopathy, lumbar region: Secondary | ICD-10-CM | POA: Diagnosis not present

## 2017-10-25 DIAGNOSIS — M7061 Trochanteric bursitis, right hip: Secondary | ICD-10-CM | POA: Diagnosis not present

## 2017-10-25 DIAGNOSIS — Z6829 Body mass index (BMI) 29.0-29.9, adult: Secondary | ICD-10-CM | POA: Diagnosis not present

## 2017-10-25 DIAGNOSIS — I10 Essential (primary) hypertension: Secondary | ICD-10-CM | POA: Diagnosis not present

## 2017-11-01 DIAGNOSIS — F329 Major depressive disorder, single episode, unspecified: Secondary | ICD-10-CM | POA: Diagnosis not present

## 2017-11-01 DIAGNOSIS — F419 Anxiety disorder, unspecified: Secondary | ICD-10-CM | POA: Diagnosis not present

## 2017-11-01 DIAGNOSIS — F4323 Adjustment disorder with mixed anxiety and depressed mood: Secondary | ICD-10-CM | POA: Diagnosis not present

## 2017-11-05 DIAGNOSIS — M1811 Unilateral primary osteoarthritis of first carpometacarpal joint, right hand: Secondary | ICD-10-CM | POA: Diagnosis not present

## 2017-11-07 DIAGNOSIS — F4323 Adjustment disorder with mixed anxiety and depressed mood: Secondary | ICD-10-CM | POA: Diagnosis not present

## 2017-11-13 DIAGNOSIS — F4323 Adjustment disorder with mixed anxiety and depressed mood: Secondary | ICD-10-CM | POA: Diagnosis not present

## 2017-11-15 DIAGNOSIS — F329 Major depressive disorder, single episode, unspecified: Secondary | ICD-10-CM | POA: Diagnosis not present

## 2017-11-15 DIAGNOSIS — F419 Anxiety disorder, unspecified: Secondary | ICD-10-CM | POA: Diagnosis not present

## 2017-11-18 MED FILL — OXYBUTYNIN CL ER 10 MG TAB: 10 | 90 days supply | Qty: 90 | Fill #3

## 2017-11-18 MED FILL — ALPRAZolam 0.5 MG TABS: 0.5 | 20 days supply | Qty: 60 | Fill #1

## 2017-11-19 DIAGNOSIS — F4323 Adjustment disorder with mixed anxiety and depressed mood: Secondary | ICD-10-CM | POA: Diagnosis not present

## 2017-11-20 ENCOUNTER — Ambulatory Visit: Payer: 59 | Admitting: Gastroenterology

## 2017-11-20 ENCOUNTER — Encounter: Payer: Self-pay | Admitting: Gastroenterology

## 2017-11-20 VITALS — BP 116/70 | HR 72 | Ht 66.0 in | Wt 173.4 lb

## 2017-11-20 DIAGNOSIS — K219 Gastro-esophageal reflux disease without esophagitis: Secondary | ICD-10-CM | POA: Diagnosis not present

## 2017-11-20 DIAGNOSIS — R0789 Other chest pain: Secondary | ICD-10-CM

## 2017-11-20 MED ORDER — OMEPRAZOLE 40 MG PO CPDR: 40.0000 mg | DELAYED_RELEASE_CAPSULE | Freq: Every day | ORAL | 2 refills | Status: DC

## 2017-11-20 MED FILL — OMEPRAZOLE DR 40 MG CAPSULE: 40 | 30 days supply | Qty: 30 | Fill #0

## 2017-11-20 NOTE — Patient Instructions (Signed)
We have sent the following medications to your pharmacy for you to pick up at your convenience:  Omeprazole 40 mg daily 30-60 minutes before dinner  Please call the office with an update of your symptoms in the next 4-6 weeks and ask for Koren Shiver, RN, BSN.

## 2017-11-20 NOTE — Progress Notes (Signed)
11/20/2017 Sarah Ballard [id number] 02-03-1956   HISTORY OF PRESENT ILLNESS: This is a 62 year old female who is a patient of Dr. Woodward Ku.  She presents to our office today with complaints of reflux and atypical chest pain.  She tells me that she had been on omeprazole 20 mg daily for quite some time.  She has been under a lot of stress with taking care of her 51 year old father, some other personal health issues, and losing her job at [employer] due to those personal issues.  She has been having a lot of problems with acid reflux.  She will actually have acid and water come up into her mouth.  She has had a few occasions at nighttime where she gets severe gas pain in the center of her chest.  Complains of a lot of belching.  Apparently her cardiologist told her to discontinue the Prilosec and place her on Pepcid and Gaviscon.  She has not had any improvement in her symptoms over the last few months while being on these medications.  She really thinks that a lot of it is due to either her other medications or stress as her diet is fairly light and healthy.  She denies any dysphasia or weight loss.  She's never had EGD in the past.     Past Medical History:  Diagnosis Date  . Angina pectoris ([place]) 2005   with stent placement Anterior decscending  . Anxiety   . Arthritis   . Bone spur    Right Hip  . CAD (coronary artery disease) 2005   s/p PCI LAD and cutting balloon angioplasty  with stent reexpansion 04/2008  . Colon polyps   . Degenerative disc disease   . Depression   . GERD (gastroesophageal reflux disease)   . Hypercholesterolemia   . Hyperlipemia   . Hypertension   . Hypothyroidism   . MI (myocardial infarction) ([place]) 2005   angio with stent  . Positive PPD    neg. CXR, treated with INH for 1 yr.  about 74  . UTI (urinary tract infection) 05/31/15   Past Surgical History:  Procedure Laterality Date  . COLONOSCOPY    . CORONARY ANGIOPLASTY  2009  . CORONARY  ANGIOPLASTY WITH STENT PLACEMENT  2005  . HEMORRHOID SURGERY  06/2015   Removal   . LAPAROSCOPY     for endometriosis  . LEFT HEART CATHETERIZATION WITH CORONARY ANGIOGRAM Bilateral 06/27/2011   Procedure: LEFT HEART CATHETERIZATION WITH CORONARY ANGIOGRAM;  Surgeon: Burnell Blanks, MD;  Location: [facility] CATH LAB;  Service: Cardiovascular;  Laterality: Bilateral;  . LUMBAR EPIDURAL INJECTION     many  . NERVE ROOT BLOCK     epidural steroids; multiple  . POLYPECTOMY    . ruptured disc  2007   with epidural   . TONGUE BIOPSY     keratin accumulation - Benign   . TONSILLECTOMY    . TUBAL LIGATION      reports that she has never smoked. She has never used smokeless tobacco. She reports that she drinks about 0.6 oz of alcohol per week. She reports that she does not use drugs. family history includes Atrial fibrillation in her mother; Colon cancer in her paternal grandmother; Colon cancer (age of onset: 84) in her mother; Diabetes in her father, mother, and paternal grandmother; Esophageal cancer (age of onset: 67) in her cousin; Heart attack in her father; Heart disease in her father; Hypertension in her mother; Kidney disease in her  father; Rheum arthritis in her mother; Stroke in her maternal grandmother and mother. Allergies  Allergen Reactions  . Latex     itching  . Lisinopril Cough  . Tramadol Hcl     REACTION: nausea, vomiting  . Ultram [Tramadol]     Nausea and vomitting       Outpatient Encounter Medications as of 11/20/2017  Medication Sig  . acetaminophen (TYLENOL) 500 MG tablet Take 500 mg by mouth every 6 (six) hours as needed for headache (and pain).   Marland Kitchen ALPRAZolam (XANAX) 0.5 MG tablet Take 1-3 tablets by mouth daily as needed for anxiety and sleep  . Calcium Carbonate-Vitamin D (CALTRATE 600+D) 600-400 MG-UNIT per tablet Take 1 tablet daily by mouth.   . clopidogrel (PLAVIX) 75 MG tablet TAKE 1 TABLET BY MOUTH DAILY  . cyclobenzaprine (FLEXERIL) 10 MG tablet Take  10 mg as directed by mouth. For muscle spasms  . docusate sodium (COLACE) 100 MG capsule Take 100 mg by mouth as needed (for constipation).   Marland Kitchen escitalopram (LEXAPRO) 20 MG tablet Take 1 tablet by mouth daily. Reported on 10/11/2015  . estradiol (ESTRACE) 0.1 MG/GM vaginal cream Pea size amount to urethra 3 times a week at HS  . fluticasone (FLONASE) 50 MCG/ACT nasal spray Place 2 sprays into both nostrils daily as needed for allergies or rhinitis.  Marland Kitchen glucosamine-chondroitin 500-400 MG tablet Take 2 tablets daily by mouth.   Marland Kitchen HYDROcodone-acetaminophen (NORCO/VICODIN) 5-325 MG tablet Take 0.5-1 tablets 2 (two) times daily as needed by mouth for pain.  Marland Kitchen ibuprofen (ADVIL,MOTRIN) 200 MG tablet Take 600 mg by mouth every 6 (six) hours as needed. For pain  . irbesartan (AVAPRO) 150 MG tablet Take 1 tablet (150 mg total) by mouth daily.  Marland Kitchen levothyroxine (SYNTHROID, LEVOTHROID) 125 MCG tablet Take 125 mcg by mouth daily.   . magnesium 30 MG tablet Take 60 mg daily by mouth. Reported on 07/08/2015  . metoprolol tartrate (LOPRESSOR) 25 MG tablet TAKE 1/2 TABLET BY MOUTH 2 TIMES DAILY.  . nitroGLYCERIN (NITROSTAT) 0.4 MG SL tablet Place 1 tablet (0.4 mg total) under the tongue every 5 (five) minutes as needed. For chest pain  . NON FORMULARY [facility]  Scar Cream -  Verapamil 10%, Pentoxifylline 5% Apply 1-2 grams to affected area 3-4 times daily Qty. 120 gm 3 refills  . omega-3 acid ethyl esters (LOVAZA) 1 G capsule Take 2 g by mouth 2 (two) times daily.    Marland Kitchen oxybutynin (DITROPAN-XL) 10 MG 24 hr tablet Take 10 mg by mouth daily.  Marland Kitchen oxyCODONE-acetaminophen (ROXICET) 5-325 MG tablet Take 1 tablet by mouth every 6 (six) hours as needed for severe pain.  . polyethylene glycol (MIRALAX / GLYCOLAX) packet Take 17 g by mouth daily as needed. For constipation  . promethazine (PHENERGAN) 25 MG tablet Take 1 tablet (25 mg total) by mouth every 8 (eight) hours as needed for nausea or vomiting.  .  rosuvastatin (CRESTOR) 20 MG tablet TAKE 1 TABLET (20 MG TOTAL) BY MOUTH DAILY.  Marland Kitchen Wheat Dextrin (BENEFIBER PO) Take by mouth daily. Pt unsure of dosage   No facility-administered encounter medications on file as of 11/20/2017.      REVIEW OF SYSTEMS  : All other systems reviewed and negative except where noted in the History of Present Illness.   PHYSICAL EXAM: BP 116/70   Pulse 72   Ht 5\' 6"  (1.676 m)   Wt 173 lb 6 oz (78.6 kg)   LMP 06/29/2006 (Approximate)  BMI 27.98 kg/m  General: Well developed white female in no acute distress Head: Normocephalic and atraumatic Eyes:  Sclerae anicteric, conjunctiva pink. Ears: Normal auditory acuity Lungs: Clear throughout to auscultation; no increased WOB. Heart: Regular rate and rhythm; no M/R/G. Abdomen: Soft, non-distended.  BS present.  Non-tender. Musculoskeletal: Symmetrical with no gross deformities  Skin: No lesions on visible extremities Extremities: No edema  Neurological: Alert oriented x 4, grossly non-focal Psychological:  Alert and cooperative. Normal mood and affect  ASSESSMENT AND PLAN: *GERD and atypical chest pain: Was on Prilosec 20 mg daily for years.  Switched to Pepcid, which has not helped.  She thinks a lot of this is stress induced.  We discussed possible endoscopy, but she is not interested in that at this time.  We are going to have her switch back to omeprazole and increase it to 40 mg daily and have her take it 30 to 60 minutes before dinner.  Can also take some Tums or Pepcid as needed as well.  We discussed that if she does not respond well to increase the medication that we may want to consider endoscopy.  She will call back in 4 to 6 weeks with an update on her symptoms.  **25 minutes spent with the patient in which at least 50% was spent in counseling of treatment options, etc.   CC:  Shon Baton, MD

## 2017-11-29 DIAGNOSIS — F329 Major depressive disorder, single episode, unspecified: Secondary | ICD-10-CM | POA: Diagnosis not present

## 2017-11-29 DIAGNOSIS — F419 Anxiety disorder, unspecified: Secondary | ICD-10-CM | POA: Diagnosis not present

## 2017-12-04 MED FILL — CYCLOBENZAPRINE 10 MG TAB: 10 | 10 days supply | Qty: 30 | Fill #1

## 2017-12-04 MED FILL — LEVOTHYROXINE 125 MCG TAB: 125 | 90 days supply | Qty: 90 | Fill #1

## 2017-12-04 NOTE — Progress Notes (Signed)
Reviewed and agree with documentation and assessment and plan. K. Veena Nandigam , MD   

## 2017-12-06 DIAGNOSIS — M1611 Unilateral primary osteoarthritis, right hip: Secondary | ICD-10-CM | POA: Diagnosis not present

## 2017-12-06 DIAGNOSIS — M25551 Pain in right hip: Secondary | ICD-10-CM | POA: Diagnosis not present

## 2017-12-06 MED FILL — ALPRAZolam 0.5 MG TABS: 0.5 | 20 days supply | Qty: 60 | Fill #0

## 2017-12-09 DIAGNOSIS — F4323 Adjustment disorder with mixed anxiety and depressed mood: Secondary | ICD-10-CM | POA: Diagnosis not present

## 2017-12-12 DIAGNOSIS — F419 Anxiety disorder, unspecified: Secondary | ICD-10-CM | POA: Diagnosis not present

## 2017-12-12 DIAGNOSIS — M545 Low back pain: Secondary | ICD-10-CM | POA: Diagnosis not present

## 2017-12-12 DIAGNOSIS — M25551 Pain in right hip: Secondary | ICD-10-CM | POA: Diagnosis not present

## 2017-12-12 DIAGNOSIS — I10 Essential (primary) hypertension: Secondary | ICD-10-CM | POA: Diagnosis not present

## 2017-12-12 DIAGNOSIS — F329 Major depressive disorder, single episode, unspecified: Secondary | ICD-10-CM | POA: Diagnosis not present

## 2017-12-12 MED FILL — HYDROCODON-APAP 5-325: 5-325 | 5 days supply | Qty: 40 | Fill #0

## 2017-12-13 DIAGNOSIS — F419 Anxiety disorder, unspecified: Secondary | ICD-10-CM | POA: Diagnosis not present

## 2017-12-13 DIAGNOSIS — M25551 Pain in right hip: Secondary | ICD-10-CM | POA: Diagnosis not present

## 2017-12-13 DIAGNOSIS — F329 Major depressive disorder, single episode, unspecified: Secondary | ICD-10-CM | POA: Diagnosis not present

## 2017-12-16 ENCOUNTER — Telehealth: Payer: Self-pay | Admitting: Gastroenterology

## 2017-12-16 NOTE — Telephone Encounter (Signed)
Sarah Ballard

## 2017-12-16 NOTE — Telephone Encounter (Signed)
Patient calling to notify nurse Patty and APP Janett Billow that new dose of medication prilosec seems to be working great and her symptoms are gone. FYI

## 2017-12-18 MED FILL — IRBESARTAN 150 MG TABLET: 150 | 90 days supply | Qty: 90 | Fill #1

## 2017-12-18 MED FILL — OMEPRAZOLE 40 MG CPDR: 40 | 30 days supply | Qty: 30 | Fill #1

## 2017-12-19 ENCOUNTER — Ambulatory Visit: Payer: 59 | Admitting: Certified Nurse Midwife

## 2017-12-19 DIAGNOSIS — M1611 Unilateral primary osteoarthritis, right hip: Secondary | ICD-10-CM | POA: Diagnosis not present

## 2017-12-19 DIAGNOSIS — M25551 Pain in right hip: Secondary | ICD-10-CM | POA: Diagnosis not present

## 2017-12-26 ENCOUNTER — Encounter: Payer: Self-pay | Admitting: Cardiovascular Disease

## 2017-12-26 ENCOUNTER — Ambulatory Visit: Payer: 59 | Admitting: Cardiovascular Disease

## 2017-12-26 ENCOUNTER — Other Ambulatory Visit: Payer: 59 | Admitting: *Deleted

## 2017-12-26 VITALS — BP 110/82 | HR 76 | Ht 66.0 in | Wt 170.0 lb

## 2017-12-26 DIAGNOSIS — I2510 Atherosclerotic heart disease of native coronary artery without angina pectoris: Secondary | ICD-10-CM

## 2017-12-26 DIAGNOSIS — F4323 Adjustment disorder with mixed anxiety and depressed mood: Secondary | ICD-10-CM | POA: Diagnosis not present

## 2017-12-26 DIAGNOSIS — I10 Essential (primary) hypertension: Secondary | ICD-10-CM

## 2017-12-26 DIAGNOSIS — E782 Mixed hyperlipidemia: Secondary | ICD-10-CM

## 2017-12-26 LAB — HEPATIC FUNCTION PANEL
ALBUMIN: 4.5 g/dL (ref 3.6–4.8)
ALT: 22 IU/L (ref 0–32)
AST: 21 IU/L (ref 0–40)
Alkaline Phosphatase: 71 IU/L (ref 39–117)
BILIRUBIN TOTAL: 0.6 mg/dL (ref 0.0–1.2)
BILIRUBIN, DIRECT: 0.19 mg/dL (ref 0.00–0.40)
TOTAL PROTEIN: 6.5 g/dL (ref 6.0–8.5)

## 2017-12-26 LAB — BASIC METABOLIC PANEL
BUN / CREAT RATIO: 19 (ref 12–28)
BUN: 17 mg/dL (ref 8–27)
CALCIUM: 9.5 mg/dL (ref 8.7–10.3)
CHLORIDE: 99 mmol/L (ref 96–106)
CO2: 25 mmol/L (ref 20–29)
CREATININE: 0.88 mg/dL (ref 0.57–1.00)
GFR calc Af Amer: 81 mL/min/{1.73_m2} (ref >59)
GFR calc non Af Amer: 70 mL/min/{1.73_m2} (ref >59)
GLUCOSE: 101 mg/dL — AB (ref 65–99)
Potassium: 3.9 mmol/L (ref 3.5–5.2)
Sodium: 139 mmol/L (ref 134–144)

## 2017-12-26 LAB — LIPID PANEL
CHOL/HDL RATIO: 2.5 ratio (ref 0.0–4.4)
CHOLESTEROL TOTAL: 152 mg/dL (ref 100–199)
HDL: 60 mg/dL (ref >39)
LDL CALC: 81 mg/dL (ref 0–99)
Triglycerides: 55 mg/dL (ref 0–149)
VLDL CHOLESTEROL CAL: 11 mg/dL (ref 5–40)

## 2017-12-26 NOTE — Patient Instructions (Signed)
Medication Instructions:  Your physician recommends that you continue on your current medications as directed. Please refer to the Current Medication list given to you today.   Labwork: TODAY - cholesterol, liver panel, basic metabolic panel   Testing/Procedures: None Ordered   Follow-Up: Your physician wants you to follow-up in: 6 months with Dr. Nahser. You will receive a reminder letter in the mail two months in advance. If you don't receive a letter, please call our office to schedule the follow-up appointment.   If you need a refill on your cardiac medications before your next appointment, please call your pharmacy.   Thank you for choosing [facility]! Kyliegh Jester, RN [phone]    

## 2017-12-26 NOTE — Progress Notes (Signed)
Sarah Ballard Date of Birth  [birth year] [facility] [address]    [place], [place]  [address] [phone]  Fax  [phone]  Problem List 1. Coronary artery disease-  2. anxiety 3. Hyperlipidemia 4. Hypertension 5. Hypothyroidism 6. Chronic back pain     The 62 yo  female with a history of coronary artery disease. She's status post PTCA and stenting of her left anterior descending artery. She also had [place] procedure and reexpansion of that stent in January 2010.  She was admitted recently to the hospital for chest pain an dizziness and cath revealed no significant irregularities.  He is walking twice a week. She's not having episodes of angina with walking. 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  October 27, 2012:  Sarah Ballard is doing well from a cardiac standpoint.   She is having some back pain - needs to have a back injection but does not want to stop the Plavix.    October 27, 2013:  Sarah Ballard is doing well.  She denies any chest pain.  Bumping into lots of things and has bruising on her legs.   Her potassium was low at her last check.    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  Asked about stopping the plavix   Jan. 24, 2017:  Sarah Ballard is doing ok Has been losing weight.   Lots of stress with taking care of her father and her divorce and her son .  No CP .     November 17, 2015:  Doing ok.  Sleepy .  Had to work late last night . No CP,  Breathing is good Lots of anxiety with work stress.   July 12, 2016:  Doing ok from a cardiac standpoint Has gained some weight .  Rare episodes of CP - she thinks its due to stress or indigestion,   its not related to exertion  requests a refill of her NTG  Is trying to move  Is having lots of artheritis   Pain  Is falling lots at work   Nov. 14,  2018  Has  Moved since I last saw her. Is having some chest pain  Thinks its GERD or anxeity.  Lots of stress  Lots of stress related to the move.    The CP are atypical , do not feel like her previouis angina Takes NTG - does not really relieve the CP Finds [employer] work   Is planning on having bunion surgery at the end of this year Will be at low risk Will need to hold Plavix for 5 days prior to surgery .    Father has gained some fluid .   Feb. 27, 2019 Doing well Is very appreciative that I sent her to see Ezekiel Slocumb for her anxiety .  Father recently had the flu  Sarah Ballard has had a cough / cold.   Developed quickly. Has been seen by Dr. 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    December 26, 2017:  Sarah Ballard is seen today for follow-up visit. MRI of her hip shows that she needs to have a hip replacement Sees Dr. Alvan Dame . No angina    Current Outpatient Medications on File Prior to Visit  Medication Sig Dispense Refill  . acetaminophen (  TYLENOL) 500 MG tablet Take 500 mg by mouth every 6 (six) hours as needed for headache (and pain).     . Al Hyd-Mg Tr-Alg Ac-Sod Bicarb (GAVISCON-2 PO) Take by mouth as needed.    . ALPRAZolam (XANAX) 0.5 MG tablet Take 1-3 tablets by mouth daily as needed for anxiety and sleep    . Calcium Carbonate-Vitamin D (CALTRATE 600+D) 600-400 MG-UNIT per tablet Take 1 tablet daily by mouth.     . clopidogrel (PLAVIX) 75 MG tablet TAKE 1 TABLET BY MOUTH DAILY 90 tablet 3  . cyclobenzaprine (FLEXERIL) 10 MG tablet Take 10 mg as directed by mouth. For muscle spasms    . estradiol (ESTRACE) 0.1 MG/GM vaginal cream Pea size amount to urethra 3 times a week at HS 42.5 g 3  . fluticasone (FLONASE) 50 MCG/ACT nasal spray Place 2 sprays into both nostrils daily as needed for allergies or rhinitis.    Marland Kitchen ibuprofen (ADVIL,MOTRIN) 200 MG tablet Take 600 mg by mouth every 6 (six) hours as needed. For pain    . irbesartan (AVAPRO) 150 MG tablet  Take 1 tablet (150 mg total) by mouth daily. 90 tablet 3  . levothyroxine (SYNTHROID, LEVOTHROID) 125 MCG tablet Take 125 mcg by mouth daily.     . magnesium 30 MG tablet Take 400 mg by mouth daily. Reported on 07/08/2015    . Magnesium 400 MG CAPS Take 400 mg by mouth daily.    . metoprolol tartrate (LOPRESSOR) 25 MG tablet TAKE 1/2 TABLET BY MOUTH 2 TIMES DAILY. 90 tablet 3  . nitroGLYCERIN (NITROSTAT) 0.4 MG SL tablet Place 1 tablet (0.4 mg total) under the tongue every 5 (five) minutes as needed. For chest pain 25 tablet 6  . NON FORMULARY [facility]  Scar Cream -  Verapamil 10%, Pentoxifylline 5% Apply 1-2 grams to affected area 3-4 times daily Qty. 120 gm 3 refills    . omega-3 acid ethyl esters (LOVAZA) 1 G capsule Take 2 g by mouth 2 (two) times daily.      Marland Kitchen omeprazole (PRILOSEC) 40 MG capsule Take 1 capsule (40 mg total) by mouth daily. Before dinner 30 capsule 2  . oxybutynin (DITROPAN-XL) 10 MG 24 hr tablet Take 10 mg by mouth daily.  11  . polyethylene glycol (MIRALAX / GLYCOLAX) packet Take 17 g by mouth daily as needed. For constipation    . rosuvastatin (CRESTOR) 20 MG tablet TAKE 1 TABLET (20 MG TOTAL) BY MOUTH DAILY. 90 tablet 3   No current facility-administered medications on file prior to visit.     Allergies  Allergen Reactions  . Latex     itching  . Lisinopril Cough  . Tramadol Hcl     REACTION: nausea, vomiting  . Ultram [Tramadol]     Nausea and vomitting     Past Medical History:  Diagnosis Date  . Angina pectoris ([place]) 2005   with stent placement Anterior decscending  . Anxiety   . Arthritis   . Bone spur    Right Hip  . CAD (coronary artery disease) 2005   s/p PCI LAD and cutting balloon angioplasty  with stent reexpansion 04/2008  . Colon polyps   . Degenerative disc disease   . Depression   . GERD (gastroesophageal reflux disease)   . Hypercholesterolemia   . Hyperlipemia   . Hypertension   . Hypothyroidism   . MI (myocardial  infarction) ([place]) 2005   angio with stent  . Positive PPD  neg. CXR, treated with [place] for 1 yr.  about 44  . UTI (urinary tract infection) 05/31/15    Past Surgical History:  Procedure Laterality Date  . COLONOSCOPY    . CORONARY ANGIOPLASTY  2009  . CORONARY ANGIOPLASTY WITH STENT PLACEMENT  2005  . HEMORRHOID SURGERY  06/2015   Removal   . LAPAROSCOPY     for endometriosis  . LEFT HEART CATHETERIZATION WITH CORONARY ANGIOGRAM Bilateral 06/27/2011   Procedure: LEFT HEART CATHETERIZATION WITH CORONARY ANGIOGRAM;  Surgeon: Burnell Blanks, MD;  Location: [facility] CATH LAB;  Service: Cardiovascular;  Laterality: Bilateral;  . LUMBAR EPIDURAL INJECTION     many  . NERVE ROOT BLOCK     epidural steroids; multiple  . POLYPECTOMY    . ruptured disc  2007   with epidural   . TONGUE BIOPSY     keratin accumulation - Benign   . TONSILLECTOMY    . TUBAL LIGATION      Social History   Tobacco Use  Smoking Status Never Smoker  Smokeless Tobacco Never Used    Social History   Substance and Sexual Activity  Alcohol Use Yes  . Alcohol/week: 1.0 standard drinks  . Types: 1 Glasses of wine per week   Comment: occ wine    Family History  Problem Relation Age of Onset  . Colon cancer Mother 15  . Stroke Mother   . Hypertension Mother   . Diabetes Mother   . Rheum arthritis Mother   . Atrial fibrillation Mother   . Heart attack Father        x2  . Diabetes Father   . Kidney disease Father   . Heart disease Father   . Stroke Maternal Grandmother   . Colon cancer Paternal Grandmother   . Diabetes Paternal Grandmother   . Esophageal cancer Cousin 23    Reviw of Systems:  Reviewed in the HPI.  All other systems are negative.   Physical Exam: Blood pressure 110/82, pulse 76, height 5\' 6"  (1.676 m), weight 170 lb (77.1 kg), last menstrual period 06/29/2006, SpO2 98 %.  GEN:  Well nourished, well developed in no acute distress HEENT: Normal NECK: No JVD; No carotid  bruits LYMPHATICS: No lymphadenopathy CARDIAC: RRR , no murmurs, rubs, gallops RESPIRATORY:  Clear to auscultation without rales, wheezing or rhonchi  ABDOMEN: Soft, non-tender, non-distended MUSCULOSKELETAL:  No edema; No deformity  SKIN: Warm and dry NEUROLOGIC:  Alert and oriented x 3   ECG : December 26, 2017: Normal sinus rhythm at 76.  Normal EKG.   Assessment / Plan:   1. Coronary artery disease-     Stable  2.  Upcoming hip surgery: Inez Catalina needs to have hip surgery.  She not had any episodes of angina.  She is very stable and is at low risk for her upcoming hip surgery.  She may hold her Plavix for 5 days prior to her hip surgery.  I would prefer that she start ASA 81 mg while she is off Plavix.Marland Kitchen Restart plavix when ok with ortho   2. Anxiety -    Has stopped the Buspar and wellbutrin , follow up with Dr. Virgina Jock  3. Hyperlipidemia -     Check fasting labs today   4. Hypertension  -   BP is well controlled.   5. Hypothyroidism 6. Chronic back pain 7. Weight gain :     8. Bunion surgery  - still has some left foot numbness from the surgery    .  Body mass index is 27.44 kg/m.   Mertie Moores, MD  12/26/2017 10:52 AM    [address] [place],  [place], [place]  [address] Pager [phone] Phone: [phone]; Fax: [phone]

## 2018-01-02 ENCOUNTER — Telehealth: Payer: Self-pay | Admitting: *Deleted

## 2018-01-02 NOTE — Telephone Encounter (Signed)
   [facility] Pre-operative Risk Assessment    Request for surgical clearance:  1. What type of surgery is being performed? RIGHT TOTAL HIP REPLACEMENT   2. When is this surgery scheduled?  01/21/18   3. What type of clearance is required (medical clearance vs. Pharmacy clearance to hold med vs. Both)?  PHARMACY   4. Are there any medications that need to be held prior to surgery and how long? PLAVIX WAITING TO SEE HOW MANY DAYS THEY REQUIRE   5. Practice name and name of physician performing surgery? [facility]   6. What is your office phone number [phone]    7.   What is your office fax number [phone]  8.   Anesthesia type (None, local, MAC, general) ?    Jeanann Lewandowsky 01/02/2018, 11:03 AM  _________________________________________________________________   (provider comments below)

## 2018-01-03 DIAGNOSIS — M4726 Other spondylosis with radiculopathy, lumbar region: Secondary | ICD-10-CM | POA: Diagnosis not present

## 2018-01-03 DIAGNOSIS — M5136 Other intervertebral disc degeneration, lumbar region: Secondary | ICD-10-CM | POA: Diagnosis not present

## 2018-01-03 DIAGNOSIS — M5416 Radiculopathy, lumbar region: Secondary | ICD-10-CM | POA: Diagnosis not present

## 2018-01-03 DIAGNOSIS — M48061 Spinal stenosis, lumbar region without neurogenic claudication: Secondary | ICD-10-CM | POA: Diagnosis not present

## 2018-01-03 NOTE — Telephone Encounter (Signed)
   Primary Cardiologist: Mertie Moores, MD  Chart reviewed as part of pre-operative protocol coverage. The patient is cleared for surgery with antiplatelet therapy recommendations as below by Dr. Acie Fredrickson 12/26/17.   "Sarah Ballard needs to have hip surgery.  She not had any episodes of angina.  She is very stable and is at low risk for her upcoming hip surgery.  She may hold her Plavix for 5 days prior to her hip surgery.  I would prefer that she start ASA 81 mg while she is off Plavix.Marland Kitchen Restart plavix when ok with ortho".  I will route this recommendation to the requesting party via Epic fax function and remove from pre-op pool.  Please call with questions.  [place], [place] 01/03/2018, 8:14 AM

## 2018-01-06 ENCOUNTER — Other Ambulatory Visit: Payer: Self-pay | Admitting: Cardiovascular Disease

## 2018-01-06 DIAGNOSIS — F4323 Adjustment disorder with mixed anxiety and depressed mood: Secondary | ICD-10-CM | POA: Diagnosis not present

## 2018-01-06 MED FILL — OMEGA-3 ETHYL ESTER 1 GM CA: 1 | 90 days supply | Qty: 360 | Fill #2

## 2018-01-06 MED FILL — ALPRAZolam 0.5 MG TABS: 0.5 | 20 days supply | Qty: 60 | Fill #1

## 2018-01-06 NOTE — H&P (Signed)
TOTAL HIP ADMISSION H&P  Patient is admitted for right total hip arthroplasty, anterior approach.  Subjective:  Chief Complaint:    Right hip primary OA / pain  HPI: Sarah Ballard, 62 y.o. female, has a history of pain and functional disability in the right hip(s) due to arthritis and patient has failed non-surgical conservative treatments for greater than 12 weeks to include NSAID's and/or analgesics, use of assistive devices and activity modification.  Onset of symptoms was gradual starting <1 year ago with rapidlly worsening course since that time.The patient noted no past surgery on the right hip(s).  Patient currently rates pain in the right hip at 10 out of 10 with activity. Patient has night pain, worsening of pain with activity and weight bearing, trendelenberg gait, pain that interfers with activities of daily living and pain with passive range of motion. Patient has evidence of periarticular osteophytes and joint space narrowing by imaging studies. This condition presents safety issues increasing the risk of falls.  There is no current active infection.  Risks, benefits and expectations were discussed with the patient.  Risks including but not limited to the risk of anesthesia, blood clots, nerve damage, blood vessel damage, failure of the prosthesis, infection and up to and including death.  Patient understand the risks, benefits and expectations and wishes to proceed with surgery.   PCP: Shon Baton, MD  D/C Plans:       Home  Post-op Meds:       No Rx given   Tranexamic Acid:      To be given - IV   Decadron:      Is to be given  FYI:     Plavix & ASA  Norco  Flexeril  DME:   Pt already has equipment    PT:   No PT    Patient Active Problem List   Diagnosis Date Noted  . Gastroesophageal reflux disease 11/20/2017  . Plantar fibromatosis 03/06/2017  . Hav (hallux abducto valgus), left 03/06/2017  . Atypical chest pain 06/26/2011  . Degenerative disc disease   .  THYROID DISORDER 05/10/2010  . HTN (hypertension) 05/10/2010  . ARTHRITIS 05/10/2010  . Hyperlipidemia 05/04/2010  . ANXIETY 05/04/2010  . DEPRESSION 05/04/2010  . CAD (coronary artery disease) 05/04/2010  . HEMORRHOIDS 05/04/2010  . HEADACHE, CHRONIC 05/04/2010   Past Medical History:  Diagnosis Date  . Angina pectoris ([place]) 2005   with stent placement Anterior decscending  . Anxiety   . Arthritis   . Bone spur    Right Hip  . CAD (coronary artery disease) 2005   s/p PCI LAD and cutting balloon angioplasty  with stent reexpansion 04/2008  . Colon polyps   . Degenerative disc disease   . Depression   . GERD (gastroesophageal reflux disease)   . Hypercholesterolemia   . Hyperlipemia   . Hypertension   . Hypothyroidism   . MI (myocardial infarction) ([place]) 2005   angio with stent  . Positive PPD    neg. CXR, treated with INH for 1 yr.  about 45  . UTI (urinary tract infection) 05/31/15    Past Surgical History:  Procedure Laterality Date  . COLONOSCOPY    . CORONARY ANGIOPLASTY  2009  . CORONARY ANGIOPLASTY WITH STENT PLACEMENT  2005  . HEMORRHOID SURGERY  06/2015   Removal   . LAPAROSCOPY     for endometriosis  . LEFT HEART CATHETERIZATION WITH CORONARY ANGIOGRAM Bilateral 06/27/2011   Procedure: LEFT HEART CATHETERIZATION WITH CORONARY  ANGIOGRAM;  Surgeon: Burnell Blanks, MD;  Location: [facility] CATH LAB;  Service: Cardiovascular;  Laterality: Bilateral;  . LUMBAR EPIDURAL INJECTION     many  . NERVE ROOT BLOCK     epidural steroids; multiple  . POLYPECTOMY    . ruptured disc  2007   with epidural   . TONGUE BIOPSY     keratin accumulation - Benign   . TONSILLECTOMY    . TUBAL LIGATION      No current facility-administered medications for this encounter.    Current Outpatient Medications  Medication Sig Dispense Refill Last Dose  . acetaminophen (TYLENOL) 500 MG tablet Take 500 mg by mouth every 6 (six) hours as needed for headache (and pain).    Taking   . Al Hyd-Mg Tr-Alg Ac-Sod Bicarb (GAVISCON-2 PO) Take by mouth as needed.   Taking  . ALPRAZolam (XANAX) 0.5 MG tablet Take 1-3 tablets by mouth daily as needed for anxiety and sleep   Taking  . Calcium Carbonate-Vitamin D (CALTRATE 600+D) 600-400 MG-UNIT per tablet Take 1 tablet daily by mouth.    Taking  . clopidogrel (PLAVIX) 75 MG tablet TAKE 1 TABLET BY MOUTH DAILY 90 tablet 3 Taking  . cyclobenzaprine (FLEXERIL) 10 MG tablet Take 10 mg as directed by mouth. For muscle spasms   Taking  . estradiol (ESTRACE) 0.1 MG/GM vaginal cream Pea size amount to urethra 3 times a week at HS 42.5 g 3 Taking  . fluticasone (FLONASE) 50 MCG/ACT nasal spray Place 2 sprays into both nostrils daily as needed for allergies or rhinitis.   Taking  . ibuprofen (ADVIL,MOTRIN) 200 MG tablet Take 600 mg by mouth every 6 (six) hours as needed. For pain   Taking  . irbesartan (AVAPRO) 150 MG tablet Take 1 tablet (150 mg total) by mouth daily. 90 tablet 3 Taking  . levothyroxine (SYNTHROID, LEVOTHROID) 125 MCG tablet Take 125 mcg by mouth daily.    Taking  . magnesium 30 MG tablet Take 400 mg by mouth daily. Reported on 07/08/2015   Taking  . Magnesium 400 MG CAPS Take 400 mg by mouth daily.   Taking  . metoprolol tartrate (LOPRESSOR) 25 MG tablet TAKE 1/2 TABLET BY MOUTH 2 TIMES DAILY. 90 tablet 3 Taking  . nitroGLYCERIN (NITROSTAT) 0.4 MG SL tablet Place 1 tablet (0.4 mg total) under the tongue every 5 (five) minutes as needed. For chest pain 25 tablet 6 Taking  . NON FORMULARY [facility]  Scar Cream -  Verapamil 10%, Pentoxifylline 5% Apply 1-2 grams to affected area 3-4 times daily Qty. 120 gm 3 refills   Taking  . omega-3 acid ethyl esters (LOVAZA) 1 G capsule Take 2 g by mouth 2 (two) times daily.     Taking  . omeprazole (PRILOSEC) 40 MG capsule Take 1 capsule (40 mg total) by mouth daily. Before dinner 30 capsule 2 Taking  . oxybutynin (DITROPAN-XL) 10 MG 24 hr tablet Take 10 mg by mouth daily.  11  Taking  . polyethylene glycol (MIRALAX / GLYCOLAX) packet Take 17 g by mouth daily as needed. For constipation   Taking  . rosuvastatin (CRESTOR) 20 MG tablet TAKE 1 TABLET (20 MG TOTAL) BY MOUTH DAILY. 90 tablet 3 Taking   Allergies  Allergen Reactions  . Latex     itching  . Lisinopril Cough  . Tramadol Hcl     REACTION: nausea, vomiting  . Ultram [Tramadol]     Nausea and vomitting  Social History   Tobacco Use  . Smoking status: Never Smoker  . Smokeless tobacco: Never Used  Substance Use Topics  . Alcohol use: Yes    Alcohol/week: 1.0 standard drinks    Types: 1 Glasses of wine per week    Comment: occ wine    Family History  Problem Relation Age of Onset  . Colon cancer Mother 57  . Stroke Mother   . Hypertension Mother   . Diabetes Mother   . Rheum arthritis Mother   . Atrial fibrillation Mother   . Heart attack Father        x2  . Diabetes Father   . Kidney disease Father   . Heart disease Father   . Stroke Maternal Grandmother   . Colon cancer Paternal Grandmother   . Diabetes Paternal Grandmother   . Esophageal cancer Cousin 61     Review of Systems  Constitutional: Negative.   HENT: Negative.   Eyes: Negative.   Respiratory: Negative.   Cardiovascular: Negative.   Gastrointestinal: Positive for heartburn.  Genitourinary: Negative.   Musculoskeletal: Positive for joint pain.  Skin: Negative.   Neurological: Positive for headaches.  Endo/Heme/Allergies: Negative.   Psychiatric/Behavioral: Positive for depression. The patient is nervous/anxious.     Objective:  Physical Exam  Constitutional: She is oriented to person, place, and time. She appears well-developed.  HENT:  Head: Normocephalic.  Eyes: Pupils are equal, round, and reactive to light.  Neck: Neck supple. No JVD present. No tracheal deviation present. No thyromegaly present.  Cardiovascular: Normal rate, regular rhythm and intact distal pulses.  Respiratory: Effort normal and  breath sounds normal. No respiratory distress. She has no wheezes.  GI: Soft. There is no tenderness. There is no guarding.  Musculoskeletal:       Right hip: She exhibits decreased range of motion, decreased strength, tenderness and bony tenderness. She exhibits no swelling, no deformity and no laceration.  Lymphadenopathy:    She has no cervical adenopathy.  Neurological: She is alert and oriented to person, place, and time. A sensory deficit (numbness right inner thigh to knee) is present.  Skin: Skin is warm and dry.  Psychiatric: She has a normal mood and affect.      Labs:  Estimated body mass index is 27.44 kg/m as calculated from the following:   Height as of 12/26/17: 5\' 6"  (1.676 m).   Weight as of 12/26/17: 77.1 kg.   Imaging Review Plain radiographs demonstrate severe degenerative joint disease of the right hip. The bone quality appears to be good for age and reported activity level.    Preoperative templating of the joint replacement has been completed, documented, and submitted to the Operating Room personnel in order to optimize intra-operative equipment management.     Assessment/Plan:  End stage arthritis, right hip  The patient history, physical examination, clinical judgement of the provider and imaging studies are consistent with end stage degenerative joint disease of the right hip and total hip arthroplasty is deemed medically necessary. The treatment options including medical management, injection therapy, arthroscopy and arthroplasty were discussed at length. The risks and benefits of total hip arthroplasty were presented and reviewed. The risks due to aseptic loosening, infection, stiffness, dislocation/subluxation,  thromboembolic complications and other imponderables were discussed.  The patient acknowledged the explanation, agreed to proceed with the plan and consent was signed. Patient is being admitted for inpatient treatment for surgery, pain control,  PT, OT, prophylactic antibiotics, VTE prophylaxis, progressive ambulation and ADL's and  discharge planning.The patient is planning to be discharged home.      [place] P[place] Ziyan Schoon   PA-C  01/06/2018, 12:45 PM

## 2018-01-07 MED FILL — METOPROLOL TARTRATE 25 MG T: 25 | 90 days supply | Qty: 90 | Fill #0

## 2018-01-10 DIAGNOSIS — F419 Anxiety disorder, unspecified: Secondary | ICD-10-CM | POA: Diagnosis not present

## 2018-01-10 DIAGNOSIS — F329 Major depressive disorder, single episode, unspecified: Secondary | ICD-10-CM | POA: Diagnosis not present

## 2018-01-10 MED FILL — ROSUVASTATIN CALCIUM 20 MG: 20 | 90 days supply | Qty: 90 | Fill #2

## 2018-01-10 MED FILL — FLUTICASONE PROP 50 MCG SPR: 50 | 90 days supply | Qty: 48 | Fill #1

## 2018-01-10 NOTE — Patient Instructions (Addendum)
Sarah Ballard  01/10/2018   Your procedure is scheduled on: 01-21-18     Report to [facility] Main  Entrance    Report to Admitting at 9:00 AM    Call this number if you have problems the morning of surgery [phone]    Remember: Do not eat food or drink liquids : After Midnight.    BRUSH YOUR TEETH MORNING OF SURGERY AND RINSE YOUR MOUTH OUT, NO CHEWING GUM CANDY OR MINTS.     Take these medicines the morning of surgery with A SIP OF WATER: Metoprolol Tarte (Lopressor), and Alprazolam (Xanax). You Sarah also bring and use nasal spray as needed.                                You Sarah not have any metal on your body including hair pins and              piercings  Do not wear jewelry, make-up, lotions, powders or perfumes, deodorant             Do not wear nail polish.  Do not shave  48 hours prior to surgery.                 Do not bring valuables to the hospital. [place].  Contacts, dentures or bridgework Sarah not be worn into surgery.  Leave suitcase in the car. After surgery it Sarah be brought to your room.    Special Instructions: N/A              Please read over the following fact sheets you were given: _____________________________________________________________________           [facility] - Preparing for Surgery Before surgery, you can play an important role.  Because skin is not sterile, your skin needs to be as free of germs as possible.  You can reduce the number of germs on your skin by washing with CHG (chlorahexidine gluconate) soap before surgery.  CHG is an antiseptic cleaner which kills germs and bonds with the skin to continue killing germs even after washing. Please DO NOT use if you have an allergy to CHG or antibacterial soaps.  If your skin becomes reddened/irritated stop using the CHG and inform your nurse when you arrive at Short Stay. Do not shave (including legs and  underarms) for at least 48 hours prior to the first CHG shower.  You Sarah shave your face/neck. Please follow these instructions carefully:  1.  Shower with CHG Soap the night before surgery and the  morning of Surgery.  2.  If you choose to wash your hair, wash your hair first as usual with your  normal  shampoo.  3.  After you shampoo, rinse your hair and body thoroughly to remove the  shampoo.                           4.  Use CHG as you would any other liquid soap.  You can apply chg directly  to the skin and wash                       Gently with  a scrungie or clean washcloth.  5.  Apply the CHG Soap to your body ONLY FROM THE NECK DOWN.   Do not use on face/ open                           Wound or open sores. Avoid contact with eyes, ears mouth and genitals (private parts).                       Wash face,  Genitals (private parts) with your normal soap.             6.  Wash thoroughly, paying special attention to the area where your surgery  will be performed.  7.  Thoroughly rinse your body with warm water from the neck down.  8.  DO NOT shower/wash with your normal soap after using and rinsing off  the CHG Soap.                9.  Pat yourself dry with a clean towel.            10.  Wear clean pajamas.            11.  Place clean sheets on your bed the night of your first shower and do not  sleep with pets. Day of Surgery : Do not apply any lotions/deodorants the morning of surgery.  Please wear clean clothes to the hospital/surgery center.  FAILURE TO FOLLOW THESE INSTRUCTIONS Sarah RESULT IN THE CANCELLATION OF YOUR SURGERY PATIENT SIGNATURE_________________________________  NURSE SIGNATURE__________________________________  ________________________________________________________________________   Adam Phenix  An incentive spirometer is a tool that can help keep your lungs clear and active. This tool measures how well you are filling your lungs with each breath. Taking  long deep breaths Sarah help reverse or decrease the chance of developing breathing (pulmonary) problems (especially infection) following:  A long period of time when you are unable to move or be active. BEFORE THE PROCEDURE   If the spirometer includes an indicator to show your best effort, your nurse or respiratory therapist will set it to a desired goal.  If possible, sit up straight or lean slightly forward. Try not to slouch.  Hold the incentive spirometer in an upright position. INSTRUCTIONS FOR USE  1. Sit on the edge of your bed if possible, or sit up as far as you can in bed or on a chair. 2. Hold the incentive spirometer in an upright position. 3. Breathe out normally. 4. Place the mouthpiece in your mouth and seal your lips tightly around it. 5. Breathe in slowly and as deeply as possible, raising the piston or the ball toward the top of the column. 6. Hold your breath for 3-5 seconds or for as long as possible. Allow the piston or ball to fall to the bottom of the column. 7. Remove the mouthpiece from your mouth and breathe out normally. 8. Rest for a few seconds and repeat Steps 1 through 7 at least 10 times every 1-2 hours when you are awake. Take your time and take a few normal breaths between deep breaths. 9. The spirometer Sarah include an indicator to show your best effort. Use the indicator as a goal to work toward during each repetition. 10. After each set of 10 deep breaths, practice coughing to be sure your lungs are clear. If you have an incision (the cut made at the time of surgery), support your  incision when coughing by placing a pillow or rolled up towels firmly against it. Once you are able to get out of bed, walk around indoors and cough well. You Sarah stop using the incentive spirometer when instructed by your caregiver.  RISKS AND COMPLICATIONS  Take your time so you do not get dizzy or light-headed.  If you are in pain, you Sarah need to take or ask for pain  medication before doing incentive spirometry. It is harder to take a deep breath if you are having pain. AFTER USE  Rest and breathe slowly and easily.  It can be helpful to keep track of a log of your progress. Your caregiver can provide you with a simple table to help with this. If you are using the spirometer at home, follow these instructions: [place] IF:   You are having difficultly using the spirometer.  You have trouble using the spirometer as often as instructed.  Your pain medication is not giving enough relief while using the spirometer.  You develop fever of 100.5 F (38.1 C) or higher. SEEK IMMEDIATE MEDICAL CARE IF:   You cough up bloody sputum that had not been present before.  You develop fever of 102 F (38.9 C) or greater.  You develop worsening pain at or near the incision site. MAKE SURE YOU:   Understand these instructions.  Will watch your condition.  Will get help right away if you are not doing well or get worse. Document Released: 08/27/2006 Document Revised: 07/09/2011 Document Reviewed: 10/28/2006 [employer] Patient Information 2014 [employer], [place].   ________________________________________________________________________  WHAT IS A BLOOD TRANSFUSION? Blood Transfusion Information  A transfusion is the replacement of blood or some of its parts. Blood is made up of multiple cells which provide different functions.  Red blood cells carry oxygen and are used for blood loss replacement.  White blood cells fight against infection.  Platelets control bleeding.  Plasma helps clot blood.  Other blood products are available for specialized needs, such as hemophilia or other clotting disorders. BEFORE THE TRANSFUSION  Who gives blood for transfusions?   Healthy volunteers who are fully evaluated to make sure their blood is safe. This is blood bank blood. Transfusion therapy is the safest it has ever been in the practice of medicine.  Before blood is taken from a donor, a complete history is taken to make sure that person has no history of diseases nor engages in risky social behavior (examples are intravenous drug use or sexual activity with multiple partners). The donor's travel history is screened to minimize risk of transmitting infections, such as malaria. The donated blood is tested for signs of infectious diseases, such as HIV and hepatitis. The blood is then tested to be sure it is compatible with you in order to minimize the chance of a transfusion reaction. If you or a relative donates blood, this is often done in anticipation of surgery and is not appropriate for emergency situations. It takes many days to process the donated blood. RISKS AND COMPLICATIONS Although transfusion therapy is very safe and saves many lives, the main dangers of transfusion include:   Getting an infectious disease.  Developing a transfusion reaction. This is an allergic reaction to something in the blood you were given. Every precaution is taken to prevent this. The decision to have a blood transfusion has been considered carefully by your caregiver before blood is given. Blood is not given unless the benefits outweigh the risks. AFTER THE TRANSFUSION  Right  after receiving a blood transfusion, you will usually feel much better and more energetic. This is especially true if your red blood cells have gotten low (anemic). The transfusion raises the level of the red blood cells which carry oxygen, and this usually causes an energy increase.  The nurse administering the transfusion will monitor you carefully for complications. HOME CARE INSTRUCTIONS  No special instructions are needed after a transfusion. You Sarah find your energy is better. Speak with your caregiver about any limitations on activity for underlying diseases you Sarah have. SEEK MEDICAL CARE IF:   Your condition is not improving after your transfusion.  You develop redness or  irritation at the intravenous (IV) site. SEEK IMMEDIATE MEDICAL CARE IF:  Any of the following symptoms occur over the next 12 hours:  Shaking chills.  You have a temperature by mouth above 102 F (38.9 C), not controlled by medicine.  Chest, back, or muscle pain.  People around you feel you are not acting correctly or are confused.  Shortness of breath or difficulty breathing.  Dizziness and fainting.  You get a rash or develop hives.  You have a decrease in urine output.  Your urine turns a dark color or changes to pink, red, or brown. Any of the following symptoms occur over the next 10 days:  You have a temperature by mouth above 102 F (38.9 C), not controlled by medicine.  Shortness of breath.  Weakness after normal activity.  The white part of the eye turns yellow (jaundice).  You have a decrease in the amount of urine or are urinating less often.  Your urine turns a dark color or changes to pink, red, or brown. Document Released: 04/13/2000 Document Revised: 07/09/2011 Document Reviewed: 12/01/2007 [facility] Patient Information 2014 [place], [place].  _______________________________________________________________________

## 2018-01-10 NOTE — Progress Notes (Addendum)
01-02-18 (Epic) Cardiac Clearance from [facility], [place]  01-01-18 Left lower doppler ultrasound on chart  12-23-17 Surgical clearance from Dr. Virgina Jock on chart  12-26-17 (Epic) EKG, BMP

## 2018-01-13 ENCOUNTER — Encounter (HOSPITAL_COMMUNITY)
Admission: RE | Admit: 2018-01-13 | Discharge: 2018-01-13 | Disposition: A | Discharge status: Home / Self Care | Payer: 59 | Source: Ambulatory Visit | Attending: Orthopedic Surgery | Admitting: Orthopedic Surgery

## 2018-01-13 ENCOUNTER — Other Ambulatory Visit: Payer: Self-pay

## 2018-01-13 ENCOUNTER — Encounter (HOSPITAL_COMMUNITY): Payer: Self-pay

## 2018-01-13 DIAGNOSIS — Z01812 Encounter for preprocedural laboratory examination: Secondary | ICD-10-CM | POA: Insufficient documentation

## 2018-01-13 LAB — BASIC METABOLIC PANEL
Anion gap: 9 (ref 5–15)
BUN: 20 mg/dL (ref 8–23)
CHLORIDE: 105 mmol/L (ref 98–111)
CO2: 28 mmol/L (ref 22–32)
CREATININE: 0.65 mg/dL (ref 0.44–1.00)
Calcium: 9.5 mg/dL (ref 8.9–10.3)
Glucose, Bld: 106 mg/dL — ABNORMAL HIGH (ref 70–99)
POTASSIUM: 3.8 mmol/L (ref 3.5–5.1)
SODIUM: 142 mmol/L (ref 135–145)

## 2018-01-13 LAB — CBC
HCT: 45.4 % (ref 36.0–46.0)
Hemoglobin: 15.7 g/dL — ABNORMAL HIGH (ref 12.0–15.0)
MCH: 33.2 pg (ref 26.0–34.0)
MCHC: 34.6 g/dL (ref 30.0–36.0)
MCV: 96 fL (ref 78.0–100.0)
PLATELETS: 335 10*3/uL (ref 150–400)
RBC: 4.73 MIL/uL (ref 3.87–5.11)
RDW: 13.4 % (ref 11.5–15.5)
WBC: 7.7 10*3/uL (ref 4.0–10.5)

## 2018-01-13 LAB — SURGICAL PCR SCREEN
MRSA, PCR: POSITIVE — AB
STAPHYLOCOCCUS AUREUS: POSITIVE — AB

## 2018-01-13 LAB — ABO/RH: ABO/RH(D): O NEG

## 2018-01-13 MED FILL — DULoxetine HCL 60 MG CPEP: 60 | 90 days supply | Qty: 90 | Fill #0

## 2018-01-13 MED FILL — MUPIROCIN 2% OINTMENT: 2 | 5 days supply | Qty: 22 | Fill #0

## 2018-01-13 MED FILL — busPIRone HCL 5 MG TABS: 5 | 90 days supply | Qty: 180 | Fill #0

## 2018-01-13 MED FILL — OMEPRAZOLE 40 MG CPDR: 40 | 30 days supply | Qty: 30 | Fill #2

## 2018-01-13 NOTE — Progress Notes (Addendum)
Called patient to advise that her PCR result was positive for MRSA, and that a prescription would be called to [facility], per pt's instructions. Pt advised that she was prescribed new medications today at her MD's appt. New medications added to [facility]

## 2018-01-15 ENCOUNTER — Telehealth: Payer: Self-pay | Admitting: Cardiovascular Disease

## 2018-01-15 NOTE — Telephone Encounter (Signed)
Left message for patient and apologized that it has taken me several hours to return her call. I advised her to call me back

## 2018-01-15 NOTE — Telephone Encounter (Signed)
New Message   Pt c/o BP issue: STAT if pt c/o blurred vision, one-sided weakness or slurred speech  1. What are your last 5 BP readings? 160/92, 147/92, 134/95, 143/93, 146/90, and 160/90  2. Are you having any other symptoms (ex. Dizziness, headache, blurred vision, passed out)? Anxious and upset about a result she recieved  3. What is your BP issue? Pt is really wanting to speaking with Dr. Elmarie Shiley nurse, states her blood pressure has been elevated and she's becoming scared. Please call

## 2018-01-17 ENCOUNTER — Other Ambulatory Visit: Payer: Self-pay | Admitting: *Deleted

## 2018-01-17 NOTE — Patient Outreach (Addendum)
[facility]) Care Management  01/17/2018  Sarah Ballard 1955-07-18 [id number]   Subjective: Telephone call to patient's home / mobile number, spoke with patient, and HIPAA verified.  Discussed [facility] Care Management UMR Transition of care follow up, preoperative call follow up, patient voiced understanding, and is in agreement to both types of follow up. Patient states she is ready for surgery at [facility] on 01/21/18, estimated length of stay 1 -2 days, and will have a preoperative appointment with primary MD on 01/20/18.   States this has been a very emotional year, recently lost her uncle, received positive lab results on Monday,  is having a difficult time dealing with results, has a lot going on, has a history of depression, is currently in therapy, has faith that things are going to get better, and is going to continue to press through things..   Discussed the Employee Assistance Counseling Program, patient states she is aware of the benefit, is currently accessing this service, and also is seeing a counselor at primary MD's office.  States she does not need any additional counseling services at the time. Patient states she takes care of her 36 year old father,  is concerned about his care while she is recovering, confirming back up plan, and has family / friends that will provide assistance.  Discussed Back [employer] Care Advantage Program resource for [facility],  verbally given contact number [phone]), patient voices understanding, and states she will follow up to see if any assistance is available.  Patient states she is able to manage self care and has assistance as needed with activities of daily living/ home management.  Patient voices understanding of medical diagnosis, pending surgery,  and treatment plan. States she is accessing the following [facility] benefits: outpatient pharmacy, hospital indemnity (not chosen), accidental supplemental policy (will verify with [employer],  will file claim if appropriate), and not eligible for family medical leave act (FMLA).   States she currently in the [employer] [facility] [employer] system as an employee without a position, was in the  process of looking with a job within the system when she found out she was going to need surgery, job search has been put on hold, and planning to resume search after surgery recovery.   Patient states she does not have any preoperative questions, care coordination, disease management, disease monitoring, transportation, community resource, or pharmacy needs at this time.  States he is very appreciative of the follow up and is in agreement to receive [facility] information post transition of care follow up.       Objective: Per KPN (Knowledge Performance Now, point of care tool) and chart review, patient to be admitted 01/21/18 for RIGHT TOTAL HIP ARTHROPLASTY ANTERIOR APPROACH.   Patient also has a history of CAD, hypertension, hyperlipidemia, Hypothyroidism, Depression, and MI (myocardial infarction).         Assessment:  Received UMR Preoperative / Transition of care referral on 01/17/18.   Preoperative call completed, and transition of care follow up pending notification of patient discharge.      Plan: RNCM will call patient for  telephone outreach attempt, transition of care follow up, within 3 business days of hospital discharge notification.       Klaryssa Fauth H. Annia Friendly, BSN, [place] Management [facility] Telephonic CM Phone: [phone] Fax: [phone]

## 2018-01-20 DIAGNOSIS — D692 Other nonthrombocytopenic purpura: Secondary | ICD-10-CM | POA: Diagnosis not present

## 2018-01-20 DIAGNOSIS — E038 Other specified hypothyroidism: Secondary | ICD-10-CM | POA: Diagnosis not present

## 2018-01-20 DIAGNOSIS — M25551 Pain in right hip: Secondary | ICD-10-CM | POA: Diagnosis not present

## 2018-01-20 DIAGNOSIS — Z6827 Body mass index (BMI) 27.0-27.9, adult: Secondary | ICD-10-CM | POA: Diagnosis not present

## 2018-01-20 DIAGNOSIS — I10 Essential (primary) hypertension: Secondary | ICD-10-CM | POA: Diagnosis not present

## 2018-01-20 DIAGNOSIS — Z1389 Encounter for screening for other disorder: Secondary | ICD-10-CM | POA: Diagnosis not present

## 2018-01-20 DIAGNOSIS — E668 Other obesity: Secondary | ICD-10-CM | POA: Diagnosis not present

## 2018-01-20 DIAGNOSIS — F3289 Other specified depressive episodes: Secondary | ICD-10-CM | POA: Diagnosis not present

## 2018-01-21 ENCOUNTER — Encounter (HOSPITAL_COMMUNITY): Payer: Self-pay | Admitting: *Deleted

## 2018-01-21 ENCOUNTER — Inpatient Hospital Stay (HOSPITAL_COMMUNITY): Payer: 59

## 2018-01-21 ENCOUNTER — Other Ambulatory Visit: Payer: Self-pay

## 2018-01-21 ENCOUNTER — Encounter (HOSPITAL_COMMUNITY)
Admission: RE | Disposition: A | Discharge status: Home / Self Care | Payer: Self-pay | Source: Ambulatory Visit | Attending: Orthopedic Surgery

## 2018-01-21 ENCOUNTER — Inpatient Hospital Stay (HOSPITAL_COMMUNITY): Payer: 59 | Admitting: Anesthesiology

## 2018-01-21 ENCOUNTER — Inpatient Hospital Stay (HOSPITAL_COMMUNITY)
Admission: RE | Admit: 2018-01-21 | Discharge: 2018-01-23 | DRG: 470 | Disposition: A | Discharge status: Home / Self Care | Payer: 59 | Source: Ambulatory Visit | Attending: Orthopedic Surgery | Admitting: Orthopedic Surgery

## 2018-01-21 DIAGNOSIS — Z888 Allergy status to other drugs, medicaments and biological substances status: Secondary | ICD-10-CM | POA: Diagnosis not present

## 2018-01-21 DIAGNOSIS — Z91040 Latex allergy status: Secondary | ICD-10-CM | POA: Diagnosis not present

## 2018-01-21 DIAGNOSIS — D62 Acute posthemorrhagic anemia: Secondary | ICD-10-CM | POA: Diagnosis not present

## 2018-01-21 DIAGNOSIS — Z6827 Body mass index (BMI) 27.0-27.9, adult: Secondary | ICD-10-CM

## 2018-01-21 DIAGNOSIS — Z96641 Presence of right artificial hip joint: Secondary | ICD-10-CM

## 2018-01-21 DIAGNOSIS — E7800 Pure hypercholesterolemia, unspecified: Secondary | ICD-10-CM | POA: Diagnosis present

## 2018-01-21 DIAGNOSIS — E039 Hypothyroidism, unspecified: Secondary | ICD-10-CM | POA: Diagnosis present

## 2018-01-21 DIAGNOSIS — M1611 Unilateral primary osteoarthritis, right hip: Principal | ICD-10-CM | POA: Diagnosis present

## 2018-01-21 DIAGNOSIS — Z79818 Long term (current) use of other agents affecting estrogen receptors and estrogen levels: Secondary | ICD-10-CM | POA: Diagnosis not present

## 2018-01-21 DIAGNOSIS — Z955 Presence of coronary angioplasty implant and graft: Secondary | ICD-10-CM

## 2018-01-21 DIAGNOSIS — I10 Essential (primary) hypertension: Secondary | ICD-10-CM | POA: Diagnosis present

## 2018-01-21 DIAGNOSIS — Z471 Aftercare following joint replacement surgery: Secondary | ICD-10-CM | POA: Diagnosis not present

## 2018-01-21 DIAGNOSIS — K219 Gastro-esophageal reflux disease without esophagitis: Secondary | ICD-10-CM | POA: Diagnosis present

## 2018-01-21 DIAGNOSIS — E663 Overweight: Secondary | ICD-10-CM | POA: Diagnosis present

## 2018-01-21 DIAGNOSIS — I2510 Atherosclerotic heart disease of native coronary artery without angina pectoris: Secondary | ICD-10-CM | POA: Diagnosis present

## 2018-01-21 DIAGNOSIS — F419 Anxiety disorder, unspecified: Secondary | ICD-10-CM | POA: Diagnosis present

## 2018-01-21 DIAGNOSIS — I252 Old myocardial infarction: Secondary | ICD-10-CM

## 2018-01-21 DIAGNOSIS — Z7902 Long term (current) use of antithrombotics/antiplatelets: Secondary | ICD-10-CM | POA: Diagnosis not present

## 2018-01-21 DIAGNOSIS — Z79899 Other long term (current) drug therapy: Secondary | ICD-10-CM

## 2018-01-21 DIAGNOSIS — Z96649 Presence of unspecified artificial hip joint: Secondary | ICD-10-CM

## 2018-01-21 HISTORY — PX: TOTAL HIP ARTHROPLASTY: SHX124

## 2018-01-21 LAB — TYPE AND SCREEN
ABO/RH(D): O NEG
Antibody Screen: NEGATIVE

## 2018-01-21 SURGERY — ARTHROPLASTY, HIP, TOTAL, ANTERIOR APPROACH
Anesthesia: Spinal | Site: Hip | Laterality: Right

## 2018-01-21 MED ORDER — OXYBUTYNIN CHLORIDE ER 5 MG PO TB24
10.0000 mg | ORAL_TABLET | Freq: Every day | ORAL | Status: DC
  Administered 2018-01-21 – 2018-01-23 (×3): 10 mg via ORAL
  Filled 2018-01-21 (×3): qty 2

## 2018-01-21 MED ORDER — NITROGLYCERIN 0.4 MG SL SUBL: 0.4000 mg | SUBLINGUAL_TABLET | SUBLINGUAL | Status: DC | PRN

## 2018-01-21 MED ORDER — CALCIUM CARBONATE-VITAMIN D 600-400 MG-UNIT PO TABS: 1.0000 | ORAL_TABLET | Freq: Every day | ORAL | Status: DC

## 2018-01-21 MED ORDER — CYCLOBENZAPRINE HCL 10 MG PO TABS: 10.0000 mg | ORAL_TABLET | Freq: Three times a day (TID) | ORAL | 0 refills | Status: AC | PRN

## 2018-01-21 MED ORDER — METHOCARBAMOL 500 MG IVPB - SIMPLE MED
500.0000 mg | Freq: Four times a day (QID) | INTRAVENOUS | Status: DC | PRN
  Administered 2018-01-21: 500 mg via INTRAVENOUS
  Filled 2018-01-21: qty 50

## 2018-01-21 MED ORDER — PROPOFOL 10 MG/ML IV BOLUS
INTRAVENOUS | Status: AC
  Filled 2018-01-21: qty 60

## 2018-01-21 MED ORDER — METOCLOPRAMIDE HCL 5 MG/ML IJ SOLN: 5.0000 mg | Freq: Three times a day (TID) | INTRAMUSCULAR | Status: DC | PRN

## 2018-01-21 MED ORDER — SODIUM CHLORIDE 0.9 % IR SOLN
Status: DC | PRN
  Administered 2018-01-21: 1000 mL

## 2018-01-21 MED ORDER — ROSUVASTATIN CALCIUM 20 MG PO TABS
20.0000 mg | ORAL_TABLET | Freq: Every day | ORAL | Status: DC
  Administered 2018-01-21 – 2018-01-22 (×2): 20 mg via ORAL
  Filled 2018-01-21 (×2): qty 1

## 2018-01-21 MED ORDER — ASPIRIN EC 81 MG PO TBEC
81.0000 mg | DELAYED_RELEASE_TABLET | Freq: Every day | ORAL | Status: DC
  Administered 2018-01-22: 81 mg via ORAL
  Filled 2018-01-21: qty 1

## 2018-01-21 MED ORDER — IRBESARTAN 150 MG PO TABS
150.0000 mg | ORAL_TABLET | Freq: Every day | ORAL | Status: DC
  Administered 2018-01-21 – 2018-01-22 (×2): 150 mg via ORAL
  Filled 2018-01-21 (×3): qty 1

## 2018-01-21 MED ORDER — MORPHINE SULFATE (PF) 2 MG/ML IV SOLN: 0.5000 mg | INTRAVENOUS | Status: DC | PRN

## 2018-01-21 MED ORDER — LACTATED RINGERS IV SOLN
INTRAVENOUS | Status: DC
  Administered 2018-01-21 (×2): via INTRAVENOUS

## 2018-01-21 MED ORDER — DIPHENHYDRAMINE HCL 12.5 MG/5ML PO ELIX: 12.5000 mg | ORAL_SOLUTION | ORAL | Status: DC | PRN

## 2018-01-21 MED ORDER — HYDROMORPHONE HCL 1 MG/ML IJ SOLN
0.2500 mg | INTRAMUSCULAR | Status: DC | PRN
  Administered 2018-01-21: 0.25 mg via INTRAVENOUS
  Administered 2018-01-21: 0.5 mg via INTRAVENOUS
  Administered 2018-01-21: 0.25 mg via INTRAVENOUS

## 2018-01-21 MED ORDER — ALPRAZOLAM 0.5 MG PO TABS
0.5000 mg | ORAL_TABLET | Freq: Two times a day (BID) | ORAL | Status: DC
  Administered 2018-01-21 – 2018-01-22 (×3): 0.5 mg via ORAL
  Filled 2018-01-21 (×4): qty 1

## 2018-01-21 MED ORDER — ONDANSETRON HCL 4 MG PO TABS: 4.0000 mg | ORAL_TABLET | Freq: Four times a day (QID) | ORAL | Status: DC | PRN

## 2018-01-21 MED ORDER — DOCUSATE SODIUM 100 MG PO CAPS: 100.0000 mg | ORAL_CAPSULE | Freq: Two times a day (BID) | ORAL | 0 refills | Status: DC

## 2018-01-21 MED ORDER — HYDROMORPHONE HCL 1 MG/ML IJ SOLN
INTRAMUSCULAR | Status: AC
  Filled 2018-01-21: qty 1

## 2018-01-21 MED ORDER — ALUM & MAG HYDROXIDE-SIMETH 200-200-20 MG/5ML PO SUSP: 15.0000 mL | ORAL | Status: DC | PRN

## 2018-01-21 MED ORDER — METHOCARBAMOL 500 MG PO TABS
500.0000 mg | ORAL_TABLET | Freq: Four times a day (QID) | ORAL | Status: DC | PRN
  Administered 2018-01-22: 500 mg via ORAL
  Filled 2018-01-21 (×2): qty 1

## 2018-01-21 MED ORDER — DULOXETINE HCL 60 MG PO CPEP
60.0000 mg | ORAL_CAPSULE | Freq: Every day | ORAL | Status: DC
  Administered 2018-01-21 – 2018-01-23 (×3): 60 mg via ORAL
  Filled 2018-01-21 (×3): qty 1

## 2018-01-21 MED ORDER — PROPOFOL 10 MG/ML IV BOLUS
INTRAVENOUS | Status: DC | PRN
  Administered 2018-01-21 (×2): 10 mg via INTRAVENOUS

## 2018-01-21 MED ORDER — TRANEXAMIC ACID 1000 MG/10ML IV SOLN
1000.0000 mg | INTRAVENOUS | Status: AC
  Administered 2018-01-21: 1000 mg via INTRAVENOUS
  Filled 2018-01-21: qty 10

## 2018-01-21 MED ORDER — BUPIVACAINE IN DEXTROSE 0.75-8.25 % IT SOLN
INTRATHECAL | Status: DC | PRN
  Administered 2018-01-21: 1.8 mL via INTRATHECAL

## 2018-01-21 MED ORDER — CALCIUM CARBONATE ANTACID 500 MG PO CHEW: 1.0000 | CHEWABLE_TABLET | Freq: Three times a day (TID) | ORAL | Status: DC | PRN

## 2018-01-21 MED ORDER — OMEPRAZOLE 20 MG PO CPDR: 40.0000 mg | DELAYED_RELEASE_CAPSULE | Freq: Every day | ORAL | Status: DC

## 2018-01-21 MED ORDER — OMEPRAZOLE 20 MG PO CPDR
40.0000 mg | DELAYED_RELEASE_CAPSULE | Freq: Every day | ORAL | Status: DC
  Administered 2018-01-21 – 2018-01-22 (×2): 40 mg via ORAL
  Filled 2018-01-21 (×2): qty 2

## 2018-01-21 MED ORDER — ONDANSETRON HCL 4 MG/2ML IJ SOLN
INTRAMUSCULAR | Status: AC
  Filled 2018-01-21: qty 2

## 2018-01-21 MED ORDER — HYDROCODONE-ACETAMINOPHEN 7.5-325 MG PO TABS: 1.0000 | ORAL_TABLET | ORAL | 0 refills | Status: DC | PRN

## 2018-01-21 MED ORDER — PROMETHAZINE HCL 25 MG/ML IJ SOLN: 6.2500 mg | INTRAMUSCULAR | Status: DC | PRN

## 2018-01-21 MED ORDER — METHOCARBAMOL 500 MG IVPB - SIMPLE MED
INTRAVENOUS | Status: AC
  Filled 2018-01-21: qty 50

## 2018-01-21 MED ORDER — STERILE WATER FOR IRRIGATION IR SOLN
Status: DC | PRN
  Administered 2018-01-21: 2000 mL

## 2018-01-21 MED ORDER — CELECOXIB 200 MG PO CAPS
200.0000 mg | ORAL_CAPSULE | Freq: Two times a day (BID) | ORAL | Status: DC
  Administered 2018-01-21 – 2018-01-22 (×3): 200 mg via ORAL
  Filled 2018-01-21 (×3): qty 1

## 2018-01-21 MED ORDER — FENTANYL CITRATE (PF) 100 MCG/2ML IJ SOLN
INTRAMUSCULAR | Status: AC
  Filled 2018-01-21: qty 2

## 2018-01-21 MED ORDER — MENTHOL 3 MG MT LOZG: 1.0000 | LOZENGE | OROMUCOSAL | Status: DC | PRN

## 2018-01-21 MED ORDER — CEFAZOLIN SODIUM-DEXTROSE 2-4 GM/100ML-% IV SOLN
2.0000 g | INTRAVENOUS | Status: AC
  Administered 2018-01-21: 2 g via INTRAVENOUS
  Filled 2018-01-21: qty 100

## 2018-01-21 MED ORDER — NON FORMULARY: 40.0000 mg | Freq: Every day | Status: DC

## 2018-01-21 MED ORDER — MIDAZOLAM HCL 2 MG/2ML IJ SOLN
INTRAMUSCULAR | Status: AC
  Filled 2018-01-21: qty 2

## 2018-01-21 MED ORDER — BUSPIRONE HCL 5 MG PO TABS
5.0000 mg | ORAL_TABLET | Freq: Three times a day (TID) | ORAL | Status: DC
  Administered 2018-01-21 – 2018-01-23 (×6): 5 mg via ORAL
  Filled 2018-01-21 (×6): qty 1

## 2018-01-21 MED ORDER — MIDAZOLAM HCL 2 MG/2ML IJ SOLN
INTRAMUSCULAR | Status: DC | PRN
  Administered 2018-01-21 (×2): 1 mg via INTRAVENOUS

## 2018-01-21 MED ORDER — PHENOL 1.4 % MT LIQD
1.0000 | OROMUCOSAL | Status: DC | PRN
  Filled 2018-01-21: qty 177

## 2018-01-21 MED ORDER — TRANEXAMIC ACID 1000 MG/10ML IV SOLN
1000.0000 mg | Freq: Once | INTRAVENOUS | Status: AC
  Administered 2018-01-21: 1000 mg via INTRAVENOUS
  Filled 2018-01-21: qty 1000

## 2018-01-21 MED ORDER — ACETAMINOPHEN 325 MG PO TABS: 325.0000 mg | ORAL_TABLET | Freq: Four times a day (QID) | ORAL | Status: DC | PRN

## 2018-01-21 MED ORDER — PROPOFOL 500 MG/50ML IV EMUL
INTRAVENOUS | Status: DC | PRN
  Administered 2018-01-21: 50 ug/kg/min via INTRAVENOUS

## 2018-01-21 MED ORDER — POLYETHYLENE GLYCOL 3350 17 G PO PACK: 17.0000 g | PACK | Freq: Two times a day (BID) | ORAL | 0 refills | Status: DC

## 2018-01-21 MED ORDER — METOCLOPRAMIDE HCL 5 MG PO TABS: 5.0000 mg | ORAL_TABLET | Freq: Three times a day (TID) | ORAL | Status: DC | PRN

## 2018-01-21 MED ORDER — HYDROCODONE-ACETAMINOPHEN 5-325 MG PO TABS
1.0000 | ORAL_TABLET | ORAL | Status: DC | PRN
  Administered 2018-01-21 – 2018-01-23 (×6): 2 via ORAL
  Filled 2018-01-21 (×6): qty 2

## 2018-01-21 MED ORDER — DOCUSATE SODIUM 100 MG PO CAPS
100.0000 mg | ORAL_CAPSULE | Freq: Two times a day (BID) | ORAL | Status: DC
  Administered 2018-01-21 – 2018-01-23 (×4): 100 mg via ORAL
  Filled 2018-01-21 (×4): qty 1

## 2018-01-21 MED ORDER — LEVOTHYROXINE SODIUM 125 MCG PO TABS
125.0000 ug | ORAL_TABLET | Freq: Every day | ORAL | Status: DC
  Administered 2018-01-22 – 2018-01-23 (×2): 125 ug via ORAL
  Filled 2018-01-21 (×2): qty 1

## 2018-01-21 MED ORDER — FENTANYL CITRATE (PF) 100 MCG/2ML IJ SOLN
INTRAMUSCULAR | Status: DC | PRN
  Administered 2018-01-21 (×2): 50 ug via INTRAVENOUS

## 2018-01-21 MED ORDER — CEFAZOLIN SODIUM-DEXTROSE 2-4 GM/100ML-% IV SOLN
2.0000 g | Freq: Four times a day (QID) | INTRAVENOUS | Status: AC
  Administered 2018-01-21 – 2018-01-22 (×2): 2 g via INTRAVENOUS
  Filled 2018-01-21 (×2): qty 100

## 2018-01-21 MED ORDER — DEXAMETHASONE SODIUM PHOSPHATE 10 MG/ML IJ SOLN
INTRAMUSCULAR | Status: AC
  Filled 2018-01-21: qty 1

## 2018-01-21 MED ORDER — SODIUM CHLORIDE 0.9 % IV SOLN
INTRAVENOUS | Status: DC
  Administered 2018-01-21 (×2): via INTRAVENOUS

## 2018-01-21 MED ORDER — METOPROLOL TARTRATE 12.5 MG HALF TABLET
12.5000 mg | ORAL_TABLET | Freq: Two times a day (BID) | ORAL | Status: DC
  Administered 2018-01-22 – 2018-01-23 (×3): 12.5 mg via ORAL
  Filled 2018-01-21 (×4): qty 1

## 2018-01-21 MED ORDER — FERROUS SULFATE 325 (65 FE) MG PO TABS: 325.0000 mg | ORAL_TABLET | Freq: Three times a day (TID) | ORAL | 3 refills | Status: DC

## 2018-01-21 MED ORDER — VANCOMYCIN HCL IN DEXTROSE 1-5 GM/200ML-% IV SOLN
1000.0000 mg | INTRAVENOUS | Status: AC
  Administered 2018-01-21: 1000 mg via INTRAVENOUS
  Filled 2018-01-21: qty 200

## 2018-01-21 MED ORDER — FERROUS SULFATE 325 (65 FE) MG PO TABS
325.0000 mg | ORAL_TABLET | Freq: Three times a day (TID) | ORAL | Status: DC
  Administered 2018-01-21 – 2018-01-23 (×6): 325 mg via ORAL
  Filled 2018-01-21 (×6): qty 1

## 2018-01-21 MED ORDER — DEXAMETHASONE SODIUM PHOSPHATE 10 MG/ML IJ SOLN
10.0000 mg | Freq: Once | INTRAMUSCULAR | Status: AC
  Administered 2018-01-22: 10 mg via INTRAVENOUS
  Filled 2018-01-21: qty 1

## 2018-01-21 MED ORDER — POLYETHYLENE GLYCOL 3350 17 G PO PACK
17.0000 g | PACK | Freq: Two times a day (BID) | ORAL | Status: DC
  Administered 2018-01-22: 17 g via ORAL
  Filled 2018-01-21 (×3): qty 1

## 2018-01-21 MED ORDER — HYDROCODONE-ACETAMINOPHEN 7.5-325 MG PO TABS
1.0000 | ORAL_TABLET | ORAL | Status: DC | PRN
  Administered 2018-01-22: 1 via ORAL
  Administered 2018-01-22 (×2): 2 via ORAL
  Filled 2018-01-21: qty 2
  Filled 2018-01-21: qty 1
  Filled 2018-01-21: qty 2

## 2018-01-21 MED ORDER — DEXAMETHASONE SODIUM PHOSPHATE 10 MG/ML IJ SOLN
10.0000 mg | Freq: Once | INTRAMUSCULAR | Status: AC
  Administered 2018-01-21: 10 mg via INTRAVENOUS

## 2018-01-21 MED ORDER — PHENYLEPHRINE 40 MCG/ML (10ML) SYRINGE FOR IV PUSH (FOR BLOOD PRESSURE SUPPORT)
PREFILLED_SYRINGE | INTRAVENOUS | Status: AC
  Filled 2018-01-21: qty 10

## 2018-01-21 MED ORDER — FLUTICASONE PROPIONATE 50 MCG/ACT NA SUSP
2.0000 | Freq: Every day | NASAL | Status: DC
  Administered 2018-01-22 – 2018-01-23 (×2): 2 via NASAL
  Filled 2018-01-21: qty 16

## 2018-01-21 MED ORDER — CALCIUM CARBONATE-VITAMIN D 500-200 MG-UNIT PO TABS
1.0000 | ORAL_TABLET | Freq: Every day | ORAL | Status: DC
  Administered 2018-01-21 – 2018-01-23 (×3): 1 via ORAL
  Filled 2018-01-21 (×3): qty 1

## 2018-01-21 MED ORDER — ONDANSETRON HCL 4 MG/2ML IJ SOLN
INTRAMUSCULAR | Status: DC | PRN
  Administered 2018-01-21: 4 mg via INTRAVENOUS

## 2018-01-21 MED ORDER — PHENYLEPHRINE 40 MCG/ML (10ML) SYRINGE FOR IV PUSH (FOR BLOOD PRESSURE SUPPORT)
PREFILLED_SYRINGE | INTRAVENOUS | Status: DC | PRN
  Administered 2018-01-21 (×5): 80 ug via INTRAVENOUS

## 2018-01-21 MED ORDER — CLOPIDOGREL BISULFATE 75 MG PO TABS
75.0000 mg | ORAL_TABLET | Freq: Every day | ORAL | Status: DC
  Administered 2018-01-22 – 2018-01-23 (×2): 75 mg via ORAL
  Filled 2018-01-21 (×2): qty 1

## 2018-01-21 MED ORDER — CHLORHEXIDINE GLUCONATE 4 % EX LIQD: 60.0000 mL | Freq: Once | CUTANEOUS | Status: DC

## 2018-01-21 MED ORDER — ONDANSETRON HCL 4 MG/2ML IJ SOLN
4.0000 mg | Freq: Four times a day (QID) | INTRAMUSCULAR | Status: DC | PRN
  Administered 2018-01-21: 4 mg via INTRAVENOUS
  Filled 2018-01-21: qty 2

## 2018-01-21 MED ORDER — BISACODYL 10 MG RE SUPP: 10.0000 mg | Freq: Every day | RECTAL | Status: DC | PRN

## 2018-01-21 MED ORDER — MAGNESIUM CITRATE PO SOLN: 1.0000 | Freq: Once | ORAL | Status: DC | PRN

## 2018-01-21 SURGICAL SUPPLY — 43 items
BAG DECANTER FOR FLEXI CONT (MISCELLANEOUS) IMPLANT
BAG ZIPLOCK 12X15 (MISCELLANEOUS) IMPLANT
BLADE SAG 18X100X1.27 (BLADE) ×2 IMPLANT
COVER PERINEAL POST (MISCELLANEOUS) ×2 IMPLANT
COVER SURGICAL LIGHT HANDLE (MISCELLANEOUS) ×2 IMPLANT
CUP ACET PINNACLE SECTR 50MM (Hips) ×1 IMPLANT
DERMABOND ADVANCED (GAUZE/BANDAGES/DRESSINGS) ×1
DERMABOND ADVANCED .7 DNX12 (GAUZE/BANDAGES/DRESSINGS) ×1 IMPLANT
DRAPE STERI IOBAN 125X83 (DRAPES) ×2 IMPLANT
DRAPE U-SHAPE 47X51 STRL (DRAPES) ×4 IMPLANT
DRESSING AQUACEL AG SP 3.5X10 (GAUZE/BANDAGES/DRESSINGS) ×1 IMPLANT
DRSG AQUACEL AG SP 3.5X10 (GAUZE/BANDAGES/DRESSINGS) ×2
DURAPREP 26ML APPLICATOR (WOUND CARE) ×2 IMPLANT
ELECT REM PT RETURN 15FT ADLT (MISCELLANEOUS) ×2 IMPLANT
ELIMINATOR HOLE APEX DEPUY (Hips) ×2 IMPLANT
GLOVE BIOGEL M STRL SZ7.5 (GLOVE) IMPLANT
GLOVE BIOGEL PI IND STRL 7.5 (GLOVE) ×4 IMPLANT
GLOVE BIOGEL PI IND STRL 8.5 (GLOVE) IMPLANT
GLOVE BIOGEL PI INDICATOR 7.5 (GLOVE) ×4
GLOVE BIOGEL PI INDICATOR 8.5 (GLOVE)
GLOVE ECLIPSE 8.0 STRL XLNG CF (GLOVE) IMPLANT
GLOVE ORTHO TXT STRL SZ7.5 (GLOVE) IMPLANT
GOWN STRL REUS W/TWL 2XL LVL3 (GOWN DISPOSABLE) ×2 IMPLANT
GOWN STRL REUS W/TWL LRG LVL3 (GOWN DISPOSABLE) ×2 IMPLANT
HEAD FEMORAL 32 CERAMIC (Hips) ×2 IMPLANT
HOLDER FOLEY CATH W/STRAP (MISCELLANEOUS) ×2 IMPLANT
LINER ACET PNNCL PLUS4 NEUTRAL (Hips) ×1 IMPLANT
PACK ANTERIOR HIP CUSTOM (KITS) ×2 IMPLANT
PINNACLE PLUS 4 NEUTRAL (Hips) ×2 IMPLANT
PINNACLE SECTOR CUP 50MM (Hips) ×2 IMPLANT
SCREW PINN CAN BONE 6.5MMX15MM (Screw) ×2 IMPLANT
STEM FEMORAL SZ6 HIGH ACTIS (Stem) ×2 IMPLANT
SUT MNCRL AB 4-0 PS2 18 (SUTURE) ×2 IMPLANT
SUT STRATAFIX 0 PDS 27 VIOLET (SUTURE) ×2
SUT VIC AB 1 CT1 36 (SUTURE) ×6 IMPLANT
SUT VIC AB 2-0 CT1 27 (SUTURE) ×2
SUT VIC AB 2-0 CT1 TAPERPNT 27 (SUTURE) ×2 IMPLANT
SUTURE STRATFX 0 PDS 27 VIOLET (SUTURE) ×1 IMPLANT
TRAY FOL W/BAG SLVR 16FR STRL (SET/KITS/TRAYS/PACK) ×1 IMPLANT
TRAY FOLEY MTR SLVR 16FR STAT (SET/KITS/TRAYS/PACK) IMPLANT
TRAY FOLEY W/BAG SLVR 16FR LF (SET/KITS/TRAYS/PACK) ×1
WATER STERILE IRR 1000ML POUR (IV SOLUTION) ×4 IMPLANT
YANKAUER SUCT BULB TIP 10FT TU (MISCELLANEOUS) IMPLANT

## 2018-01-21 NOTE — Op Note (Signed)
NAME:  Sarah Ballard                ACCOUNT NO.: [id number]      MEDICAL RECORD NO.: [id number]      FACILITY:  [facility]      PHYSICIAN:  Mauri Pole  DATE OF BIRTH:  12-14-1955     DATE OF PROCEDURE:  01/21/2018                                 OPERATIVE REPORT         PREOPERATIVE DIAGNOSIS: Right  hip osteoarthritis.      POSTOPERATIVE DIAGNOSIS:  Right hip osteoarthritis.      PROCEDURE:  Right total hip replacement through an anterior approach   utilizing DePuy THR system, component size 61mm pinnacle cup, a size 32+4 neutral   Altrex liner, a size 6 Hi Actis stem with a 32+1 delta ceramic   ball.      SURGEON:  Pietro Cassis. Alvan Dame, M.D.      ASSISTANT:  Nehemiah Massed, PA-C     ANESTHESIA:  Spinal.      SPECIMENS:  None.      COMPLICATIONS:  None.      BLOOD LOSS:  700 cc     DRAINS:  None.      INDICATION OF THE PROCEDURE:  Sarah Ballard is a 62 y.o. female who had   presented to office for evaluation of right hip pain.  Radiographs revealed   progressive degenerative changes with bone-on-bone   articulation of the  hip joint, including subchondral cystic changes and osteophytes.  The patient had painful limited range of   motion significantly affecting their overall quality of life and function.  The patient was failing to    respond to conservative measures including medications and/or injections and activity modification and at this point was ready   to proceed with more definitive measures.  Consent was obtained for   benefit of pain relief.  Specific risks of infection, DVT, component   failure, dislocation, neurovascular injury, and need for revision surgery were reviewed in the office as well discussion of   the anterior versus posterior approach were reviewed.     PROCEDURE IN DETAIL:  The patient was brought to operative theater.   Once adequate anesthesia, preoperative antibiotics, 2 gm of Ancef, 1 gm of Vancomycin, 1 gm of  Tranexamic Acid, and 10 mg of Decadron were administered, the patient was positioned supine on the [employer] table.  Once the patient was safely positioned with adequate padding of boney prominences we predraped out the hip, and used fluoroscopy to confirm orientation of the pelvis.      The right hip was then prepped and draped from proximal iliac crest to   mid thigh with a shower curtain technique.      Time-out was performed identifying the patient, planned procedure, and the appropriate extremity.     An incision was then made 2 cm lateral to the   anterior superior iliac spine extending over the orientation of the   tensor fascia lata muscle and sharp dissection was carried down to the   fascia of the muscle.      The fascia was then incised.  The muscle belly was identified and swept   laterally and retractor placed along the superior neck.  Following   cauterization of the circumflex vessels  and removing some pericapsular   fat, a second cobra retractor was placed on the inferior neck.  A T-capsulotomy was made along the line of the   superior neck to the trochanteric fossa, then extended proximally and   distally.  Tag sutures were placed and the retractors were then placed   intracapsular.  We then identified the trochanteric fossa and   orientation of my neck cut and then made a neck osteotomy with the femur on traction.  The femoral   head was removed without difficulty or complication.  Traction was let   off and retractors were placed posterior and anterior around the   acetabulum.      The labrum and foveal tissue were debrided.  I began reaming with a 44 mm   reamer and reamed up to 49 mm reamer with good bony bed preparation and a 50 mm  cup was chosen.  The final 50 mm Pinnacle cup was then impacted under fluoroscopy to confirm the depth of penetration and orientation with respect to   Abduction and forward flexion.  A screw was placed into the ilium followed by the hole  eliminator.  The final   32+4 neutral Altrex liner was impacted with good visualized rim fit.  The cup was positioned anatomically within the acetabular portion of the pelvis.      At this point, the femur was rolled to 100 degrees.  Further capsule was   released off the inferior aspect of the femoral neck.  I then   released the superior capsule proximally.  With the leg in a neutral position the hook was placed laterally   along the femur under the vastus lateralis origin and elevated manually and then held in position using the hook attachment on the bed.  The leg was then extended and adducted with the leg rolled to 100   degrees of external rotation.  Retractors were placed along the medial calcar and posteriorly over the greater trochanter.  Once the proximal femur was fully   exposed, I used a box osteotome to set orientation.  I then began   broaching with the starting chili pepper broach and passed this by hand and then broached up to 6.  With the 6 broach in place I chose a high offset neck and did several trial reductions.  The offset was appropriate, leg lengths   appeared to be equal best matched with the +1 head ball trial confirmed radiographically.   Given these findings, I went ahead and dislocated the hip, repositioned all   retractors and positioned the right hip in the extended and abducted position.  The final 6 Hi Tri Lock stem was   chosen and it was impacted down to the level of neck cut.  Based on this   and the trial reductions, a final 32+1 delta ceramic ball was chosen and   impacted onto a clean and dry trunnion, and the hip was reduced.  The   hip had been irrigated throughout the case again at this point.  I did   reapproximate the superior capsular leaflet to the anterior leaflet   using #1 Vicryl.  The fascia of the   tensor fascia lata muscle was then reapproximated using #1 Vicryl and #0 Stratafix sutures.  The   remaining wound was closed with 2-0 Vicryl and  running 4-0 Monocryl.   The hip was cleaned, dried, and dressed sterilely using Dermabond and   Aquacel dressing.  The patient was then  brought   to recovery room in stable condition tolerating the procedure well.    Nehemiah Massed, PA-C was present for the entirety of the case involved from   preoperative positioning, perioperative retractor management, general   facilitation of the case, as well as primary wound closure as assistant.            Pietro Cassis Alvan Dame, M.D.        01/21/2018 1:06 PM

## 2018-01-21 NOTE — Interval H&P Note (Signed)
History and Physical Interval Note:  01/21/2018 10:18 AM  Sarah Ballard  has presented today for surgery, with the diagnosis of Right hip osteoarthritis  The various methods of treatment have been discussed with the patient and family. After consideration of risks, benefits and other options for treatment, the patient has consented to  Procedure(s) with comments: [place] (Right) - 105mins as a surgical intervention .  The patient's history has been reviewed, patient examined, no change in status, stable for surgery.  I have reviewed the patient's chart and labs.  Questions were answered to the patient's satisfaction.     Mauri Pole

## 2018-01-21 NOTE — Transfer of Care (Signed)
Immediate Anesthesia Transfer of Care Note  Patient: Sarah Ballard  Procedure(s) Performed: RIGHT TOTAL HIP ARTHROPLASTY ANTERIOR APPROACH (Right Hip)  Patient Location: PACU  Anesthesia Type:MAC and Spinal  Level of Consciousness: awake, alert  and patient cooperative  Airway & Oxygen Therapy: Patient Spontanous Breathing and Patient connected to face mask oxygen  Post-op Assessment: Report given to RN and Post -op Vital signs reviewed and stable  Post vital signs: Reviewed and stable  Last Vitals:  Vitals Value Taken Time  BP    Temp    Pulse    Resp    SpO2      Last Pain:  Vitals:   01/21/18 1023  TempSrc:   PainSc: 5          Complications: No apparent anesthesia complications

## 2018-01-21 NOTE — Anesthesia Procedure Notes (Addendum)
Spinal  Patient location during procedure: OR Start time: 01/21/2018 11:30 AM End time: 01/21/2018 11:38 AM Staffing Anesthesiologist: Myrtie Soman, MD Performed: anesthesiologist  Preanesthetic Checklist Completed: patient identified, site marked, surgical consent, pre-op evaluation, timeout performed, IV checked, risks and benefits discussed and monitors and equipment checked Spinal Block Patient position: sitting Prep: ChloraPrep Patient monitoring: heart rate, continuous pulse ox and blood pressure Location: L3-4 Injection technique: single-shot Needle Needle type: Sprotte  Needle gauge: 24 G Needle length: 9 cm Additional Notes Expiration date of kit checked and confirmed. Patient tolerated procedure well, without complications.

## 2018-01-21 NOTE — Discharge Instructions (Signed)

## 2018-01-21 NOTE — Anesthesia Preprocedure Evaluation (Signed)
Anesthesia Evaluation  Patient identified by MRN, date of birth, ID band Patient awake    Reviewed: Allergy & Precautions, NPO status , Patient's Chart, lab work & pertinent test results  Airway Mallampati: II  TM Distance: >3 FB Neck ROM: Full    Dental no notable dental hx.    Pulmonary neg pulmonary ROS,    Pulmonary exam normal breath sounds clear to auscultation       Cardiovascular hypertension, + CAD, + Past MI and + Cardiac Stents  Normal cardiovascular exam Rhythm:Regular Rate:Normal     Neuro/Psych negative neurological ROS  negative psych ROS   GI/Hepatic Neg liver ROS, GERD  Medicated,  Endo/Other  Hypothyroidism   Renal/GU negative Renal ROS  negative genitourinary   Musculoskeletal negative musculoskeletal ROS (+)   Abdominal   Peds negative pediatric ROS (+)  Hematology negative hematology ROS (+)   Anesthesia Other Findings   Reproductive/Obstetrics negative OB ROS                             Anesthesia Physical Anesthesia Plan  ASA: III  Anesthesia Plan: Spinal   Post-op Pain Management:    Induction: Intravenous  PONV Risk Score and Plan: 3 and Ondansetron, Dexamethasone, Treatment may vary due to age or medical condition and Propofol infusion  Airway Management Planned: Simple Face Mask  Additional Equipment:   Intra-op Plan:   Post-operative Plan:   Informed Consent: I have reviewed the patients History and Physical, chart, labs and discussed the procedure including the risks, benefits and alternatives for the proposed anesthesia with the patient or authorized representative who has indicated his/her understanding and acceptance.   Dental advisory given  Plan Discussed with: CRNA and Surgeon  Anesthesia Plan Comments:         Anesthesia Quick Evaluation

## 2018-01-21 NOTE — Anesthesia Procedure Notes (Signed)
Procedure Name: MAC Date/Time: 01/21/2018 11:35 AM Performed by: Dione Booze, CRNA Pre-anesthesia Checklist: Patient identified, Emergency Drugs available, Suction available and Patient being monitored Oxygen Delivery Method: Simple face mask Placement Confirmation: positive ETCO2

## 2018-01-21 NOTE — Anesthesia Postprocedure Evaluation (Signed)
Anesthesia Post Note  Patient: Sarah Ballard  Procedure(s) Performed: RIGHT TOTAL HIP ARTHROPLASTY ANTERIOR APPROACH (Right Hip)     Patient location during evaluation: PACU Anesthesia Type: Spinal Level of consciousness: oriented and awake and alert Pain management: pain level controlled Vital Signs Assessment: post-procedure vital signs reviewed and stable Respiratory status: spontaneous breathing, respiratory function stable and patient connected to nasal cannula oxygen Cardiovascular status: blood pressure returned to baseline and stable Postop Assessment: no headache, no backache and no apparent nausea or vomiting Anesthetic complications: no    Last Vitals:  Vitals:   01/21/18 1347 01/21/18 1415  BP: 108/75   Pulse: 84 (!) 58  Resp: 14   Temp: (!) 36.4 C   SpO2: 99%     Last Pain:  Vitals:   01/21/18 1430  TempSrc:   PainSc: 7                  Zein Helbing S

## 2018-01-21 NOTE — Progress Notes (Signed)
PACU NURSING NOTE: pt states she does not feel well, NBP reflected as 70/49 (56), immediately reck to confirm BP, same results noted, POX to 88%, pt now very sleepy, very pale, HR decreased to 58-60 SR w/o ectopy noted, skin warm and pale, weak radial pulses noted, MDA summoned immediately to beside, orders to open IVF to given IV bolus, Oxygen was increased to 4lpm [place]. Pt responded very well to fluid bolus, and increase in oxygen, VS now noted to be 70/min HR NSR, 2+ radial pulses, color improved, NBP 114/77 (90), oxygen at 4lpm  with POX at 99%. Pt denies any pain. Will cont to monitor and observe longer in Phase I PACU. No further orders rec by MDA at this time

## 2018-01-22 ENCOUNTER — Encounter (HOSPITAL_COMMUNITY): Payer: Self-pay | Admitting: Orthopedic Surgery

## 2018-01-22 DIAGNOSIS — E663 Overweight: Secondary | ICD-10-CM | POA: Diagnosis present

## 2018-01-22 LAB — CBC
HCT: 24.1 % — ABNORMAL LOW (ref 36.0–46.0)
HEMOGLOBIN: 8.6 g/dL — AB (ref 12.0–15.0)
MCH: 34 pg (ref 26.0–34.0)
MCHC: 35.7 g/dL (ref 30.0–36.0)
MCV: 95.3 fL (ref 78.0–100.0)
PLATELETS: 196 10*3/uL (ref 150–400)
RBC: 2.53 MIL/uL — AB (ref 3.87–5.11)
RDW: 13.6 % (ref 11.5–15.5)
WBC: 7.8 10*3/uL (ref 4.0–10.5)

## 2018-01-22 LAB — BASIC METABOLIC PANEL
ANION GAP: 4 — AB (ref 5–15)
BUN: 8 mg/dL (ref 8–23)
CO2: 28 mmol/L (ref 22–32)
Calcium: 7.8 mg/dL — ABNORMAL LOW (ref 8.9–10.3)
Chloride: 105 mmol/L (ref 98–111)
Creatinine, Ser: 0.68 mg/dL (ref 0.44–1.00)
GFR calc Af Amer: >60 mL/min (ref >60)
GLUCOSE: 192 mg/dL — AB (ref 70–99)
POTASSIUM: 3.8 mmol/L (ref 3.5–5.1)
Sodium: 137 mmol/L (ref 135–145)

## 2018-01-22 MED ORDER — SODIUM CHLORIDE 0.9 % IV BOLUS
250.0000 mL | Freq: Once | INTRAVENOUS | Status: AC
  Administered 2018-01-22: 250 mL via INTRAVENOUS

## 2018-01-22 MED ORDER — ALUM & MAG HYDROXIDE-SIMETH 200-200-20 MG/5ML PO SUSP: 15.0000 mL | ORAL | Status: DC | PRN

## 2018-01-22 MED FILL — NITROGLYCERIN 0.4 MG TAB SL: 0.4 | 25 days supply | Qty: 25 | Fill #0

## 2018-01-22 MED FILL — LEVOTHYROXINE 112 MCG TAB: 112 | 90 days supply | Qty: 90 | Fill #0

## 2018-01-22 NOTE — Evaluation (Signed)
Occupational Therapy Evaluation Patient Details Name: Sarah Ballard MRN: [id number] DOB: [birth year] Today's Date: 01/22/2018    History of Present Illness Pt s/p R THR and with hx of MI and CAD   Clinical Impression   Pt is s/p THA resulting in the deficits listed below (see OT Problem List).  Pt will benefit from skilled OT to increase their safety and independence with ADL and functional mobility for ADL to facilitate discharge to venue listed below.        Follow Up Recommendations  Home health OT;Supervision/Assistance - 24 hour    Equipment Recommendations  None recommended by OT    Recommendations for Other Services       Precautions / Restrictions Precautions Precautions: Fall Restrictions Weight Bearing Restrictions: No Other Position/Activity Restrictions: WBAT      Mobility Bed Mobility Overal bed mobility: Needs Assistance Bed Mobility: Supine to Sit     Supine to sit: Min assist     General bed mobility comments: pt in chair  Transfers Overall transfer level: Needs assistance Equipment used: Rolling walker (2 wheeled) Transfers: Sit to/from Stand Sit to Stand: Min assist         General transfer comment: cues for LE management and use of UEs to self assist    Balance Overall balance assessment: Mild deficits observed, not formally tested                                         ADL either performed or assessed with clinical judgement   ADL Overall ADL's : Needs assistance/impaired Eating/Feeding: Set up;Sitting   Grooming: Set up;Sitting;Cueing for safety   Upper Body Bathing: Set up;Sitting   Lower Body Bathing: Moderate assistance;Sit to/from stand;Cueing for sequencing;Cueing for safety   Upper Body Dressing : Set up;Sitting   Lower Body Dressing: Moderate assistance;Sit to/from stand;Cueing for sequencing;Cueing for safety   Toilet Transfer: Minimal assistance;BSC;Stand-pivot   Toileting- Clothing  Manipulation and Hygiene: Moderate assistance;Sit to/from stand               Vision Patient Visual Report: No change from baseline       Perception     Praxis      Pertinent Vitals/Pain Pain Assessment: 0-10 Pain Score: 8  Pain Location: R hip Pain Descriptors / Indicators: Discomfort;Sore Pain Intervention(s): Limited activity within patient's tolerance;Monitored during session;Premedicated before session;Ice applied     Hand Dominance     Extremity/Trunk Assessment Upper Extremity Assessment Upper Extremity Assessment: Generalized weakness   Lower Extremity Assessment Lower Extremity Assessment: RLE deficits/detail RLE Deficits / Details: 2+/5 strength at hip wtih AAROM at hip to 80 flex and 10 abd       Communication Communication Communication: No difficulties   Cognition Arousal/Alertness: Awake/alert Behavior During Therapy: WFL for tasks assessed/performed Overall Cognitive Status: Within Functional Limits for tasks assessed                                     General Comments       Exercises Exercises: Total Joint Total Joint Exercises Ankle Circles/Pumps: AROM;Both;20 reps;Supine Quad Sets: AROM;Both;10 reps;Supine Heel Slides: AAROM;Right;20 reps;Supine Hip ABduction/ADduction: AAROM;Right;15 reps;Supine   Shoulder Instructions      Home Living Family/patient expects to be discharged to:: Private residence Living Arrangements: Parent Available Help at Discharge: Family Type  of Home: House Home Access: Stairs to enter [employer] of Steps: 2   Home Layout: One level     Bathroom Shower/Tub: Walk-in shower         Home Equipment: Clinical cytogeneticist - 2 wheels;Bedside commode          Prior Functioning/Environment Level of Independence: Independent                 OT Problem List: Decreased strength;Decreased activity tolerance;Decreased knowledge of precautions;Decreased safety awareness       OT Treatment/Interventions: Self-care/ADL training;Patient/family education;DME and/or AE instruction;Therapeutic activities    OT Goals(Current goals can be found in the care plan section) Acute Rehab OT Goals Patient Stated Goal: Regain IND OT Goal Formulation: With patient Time For Goal Achievement: 01/29/18  OT Frequency: Min 2X/week   Barriers to Ballard/C:               AM-PAC PT "6 Clicks" Daily Activity     Outcome Measure Help from another person eating meals?: None Help from another person taking care of personal grooming?: None Help from another person toileting, which includes using toliet, bedpan, or urinal?: A Little Help from another person bathing (including washing, rinsing, drying)?: A Little Help from another person to put on and taking off regular upper body clothing?: A Little Help from another person to put on and taking off regular lower body clothing?: A Lot 6 Click Score: 19   End of Session Equipment Utilized During Treatment: Rolling walker;Gait belt Nurse Communication: Mobility status  Activity Tolerance: Patient tolerated treatment well Patient left: in chair;with call bell/phone within reach  OT Visit Diagnosis: Unsteadiness on feet (R26.81);Muscle weakness (generalized) (M62.81);Pain Pain - part of body: Hip                Time: 3500-9381 OT Time Calculation (min): 18 min Charges:  OT General Charges $OT Visit: 1 Visit OT Evaluation $OT Eval Moderate Complexity: 1 Mod  Sarah Ballard, OT Acute Rehabilitation Services [phone] Office- [phone]     Sarah Ballard, Sarah Ballard 01/22/2018, 12:51 PM

## 2018-01-22 NOTE — Progress Notes (Signed)
Physical Therapy Treatment Patient Details Name: Sarah Ballard MRN: [id number] DOB: 1955/11/18 Today's Date: 01/22/2018    History of Present Illness Pt s/p R THR and with hx of MI and CAD    PT Comments    Pt fatigued but with improved spirits, decreased pain and demonstrating improved mobility   Follow Up Recommendations  Follow surgeon's recommendation for DC plan and follow-up therapies     Equipment Recommendations  None recommended by PT    Recommendations for Other Services OT consult     Precautions / Restrictions Precautions Precautions: Fall Restrictions Weight Bearing Restrictions: No Other Position/Activity Restrictions: WBAT    Mobility  Bed Mobility Overal bed mobility: Needs Assistance Bed Mobility: Sit to Supine     Supine to sit: Min assist Sit to supine: Min assist   General bed mobility comments: cues for sequence and use of L LE to self assist  Transfers Overall transfer level: Needs assistance Equipment used: Rolling walker (2 wheeled) Transfers: Sit to/from Stand Sit to Stand: Min guard         General transfer comment: cues for LE management and use of UEs to self assist  Ambulation/Gait Ambulation/Gait assistance: Min assist;Min guard Gait Distance (Feet): 90 Feet Assistive device: Rolling walker (2 wheeled) Gait Pattern/deviations: Decreased step length - right;Decreased step length - left;Shuffle;Trunk flexed;Step-to pattern;Step-through pattern Gait velocity: decr   General Gait Details: cues for sequence, posture and position from [employer] Mobility    Modified Rankin (Stroke Patients Only)       Balance Overall balance assessment: Mild deficits observed, not formally tested                                          Cognition Arousal/Alertness: Awake/alert Behavior During Therapy: WFL for tasks assessed/performed Overall Cognitive Status: Within Functional  Limits for tasks assessed                                        Exercises Total Joint Exercises Ankle Circles/Pumps: AROM;Both;20 reps;Supine Quad Sets: AROM;Both;10 reps;Supine Heel Slides: AAROM;Right;20 reps;Supine Hip ABduction/ADduction: AAROM;Right;15 reps;Supine    General Comments        Pertinent Vitals/Pain Pain Assessment: 0-10 Pain Score: 6  Pain Location: R hip Pain Descriptors / Indicators: Discomfort;Sore Pain Intervention(s): Limited activity within patient's tolerance;Monitored during session;Premedicated before session;Ice applied    Home Living Family/patient expects to be discharged to:: Private residence Living Arrangements: Parent Available Help at Discharge: Family Type of Home: House Home Access: Stairs to enter   Home Layout: One level Home Equipment: Clinical cytogeneticist - 2 wheels;Bedside commode      Prior Function Level of Independence: Independent          PT Goals (current goals can now be found in the care plan section) Acute Rehab PT Goals Patient Stated Goal: Regain IND PT Goal Formulation: With patient Time For Goal Achievement: 01/29/18 Potential to Achieve Goals: Good Progress towards PT goals: Progressing toward goals    Frequency    7X/week      PT Plan Current plan remains appropriate    Co-evaluation              AM-PAC PT "6 Clicks" Daily  Activity  Outcome Measure  Difficulty turning over in bed (including adjusting bedclothes, sheets and blankets)?: Unable Difficulty moving from lying on back to sitting on the side of the bed? : Unable Difficulty sitting down on and standing up from a chair with arms (e.g., wheelchair, bedside commode, etc,.)?: Unable Help needed moving to and from a bed to chair (including a wheelchair)?: A Little Help needed walking in hospital room?: A Little Help needed climbing 3-5 steps with a railing? : A Lot 6 Click Score: 11    End of Session Equipment  Utilized During Treatment: Gait belt Activity Tolerance: Patient tolerated treatment well;Patient limited by pain Patient left: in bed;with call bell/phone within reach;with family/visitor present Nurse Communication: Mobility status PT Visit Diagnosis: Difficulty in walking, not elsewhere classified (R26.2)     Time: 3832-9191 PT Time Calculation (min) (ACUTE ONLY): 25 min  Charges:  $Gait Training: 8-22 mins $Therapeutic Exercise: 8-22 mins                     [place] Pager [phone] Office [phone]    Pualani Borah 01/22/2018, 3:44 PM

## 2018-01-22 NOTE — Progress Notes (Signed)
     Subjective: 1 Day Post-Op Procedure(s) (LRB): RIGHT TOTAL HIP ARTHROPLASTY ANTERIOR APPROACH (Right)   Patient reports pain as mild, pain controlled.  No events throughout the night. States that she was a little light headed yesterday when getting up.  Dr. Alvan Dame discussed the process of recovery and what to expect.  Patient is ready to be discharged home if she does well with PT.   Objective:   VITALS:   Vitals:   01/22/18 0131 01/22/18 0455  BP: 110/64 107/85  Pulse: 92 88  Resp: 16   Temp: 98.2 F (36.8 C) 98.6 F (37 C)  SpO2: 98% 100%    Dorsiflexion/Plantar flexion intact Incision: dressing C/D/I No cellulitis present Compartment soft  LABS Recent Labs    01/22/18 0457  HGB 8.6*  HCT 24.1*  WBC 7.8  PLT 196    Recent Labs    01/22/18 0457  NA 137  K 3.8  BUN 8  CREATININE 0.68  GLUCOSE 192*     Assessment/Plan: 1 Day Post-Op Procedure(s) (LRB): RIGHT TOTAL HIP ARTHROPLASTY ANTERIOR APPROACH (Right) Foley cath d/c'ed Advance diet Up with therapy D/C IV fluids Discharge home, if she does well with PT. Follow up in 2 weeks at [facility] ([place]). Follow up with OLIN,Gracianna Vink D in 2 weeks.  Contact information:  [facility]) [address] [place] [phone]    Overweight (BMI 25-29.9) Estimated body mass index is 26.95 kg/m as calculated from the following:   Height as of this encounter: 5\' 6"  (1.676 m).   Weight as of this encounter: 75.8 kg. Patient also counseled that weight may inhibit the healing process Patient counseled that losing weight will help with future health issues       [place] Pugh. Francetta Ilg   PAC  01/22/2018, 8:33 AM

## 2018-01-22 NOTE — Evaluation (Signed)
Physical Therapy Evaluation Patient Details Name: Sarah Ballard MRN: [id number] DOB: 01-Jun-1955 Today's Date: 01/22/2018   History of Present Illness  Pt s/p R THR and with hx of MI and CAD  Clinical Impression  Pt s/p R THR and presents with decreased R LE strength/ROM and post op pain limiting functional mobility.  Pt should progress to dc home with assist of family and friends.    Follow Up Recommendations Follow surgeon's recommendation for DC plan and follow-up therapies    Equipment Recommendations  None recommended by PT    Recommendations for Other Services OT consult     Precautions / Restrictions Precautions Precautions: Fall Restrictions Weight Bearing Restrictions: No Other Position/Activity Restrictions: WBAT      Mobility  Bed Mobility Overal bed mobility: Needs Assistance Bed Mobility: Supine to Sit     Supine to sit: Min assist     General bed mobility comments: Increased time with cues for sequence and use of L LE to self assist  Transfers Overall transfer level: Needs assistance Equipment used: Rolling walker (2 wheeled) Transfers: Sit to/from Stand Sit to Stand: Min assist         General transfer comment: cues for LE management and use of UEs to self assist  Ambulation/Gait Ambulation/Gait assistance: Min assist Gait Distance (Feet): 48 Feet Assistive device: Rolling walker (2 wheeled) Gait Pattern/deviations: Step-to pattern;Decreased step length - right;Decreased step length - left;Shuffle;Trunk flexed Gait velocity: decr   General Gait Details: cues for sequence, posture and position from [employer]            Wheelchair Mobility    Modified Rankin (Stroke Patients Only)       Balance Overall balance assessment: Mild deficits observed, not formally tested                                           Pertinent Vitals/Pain Pain Assessment: 0-10 Pain Score: 8  Pain Location: R hip Pain Descriptors  / Indicators: Discomfort;Sore Pain Intervention(s): Limited activity within patient's tolerance;Monitored during session;Premedicated before session;Ice applied    Home Living Family/patient expects to be discharged to:: Private residence Living Arrangements: Parent Available Help at Discharge: Family Type of Home: House Home Access: Stairs to enter   Technical brewer of Steps: 2 Home Layout: One level Home Equipment: Clinical cytogeneticist - 2 wheels;Bedside commode      Prior Function Level of Independence: Independent               Hand Dominance        Extremity/Trunk Assessment   Upper Extremity Assessment Upper Extremity Assessment: Defer to OT evaluation    Lower Extremity Assessment Lower Extremity Assessment: RLE deficits/detail RLE Deficits / Details: 2+/5 strength at hip wtih AAROM at hip to 80 flex and 10 abd       Communication   Communication: No difficulties  Cognition Arousal/Alertness: Awake/alert Behavior During Therapy: WFL for tasks assessed/performed Overall Cognitive Status: Within Functional Limits for tasks assessed                                        General Comments      Exercises Total Joint Exercises Ankle Circles/Pumps: AROM;Both;20 reps;Supine Quad Sets: AROM;Both;10 reps;Supine Heel Slides: AAROM;Right;20 reps;Supine Hip ABduction/ADduction: AAROM;Right;15 reps;Supine  Assessment/Plan    PT Assessment Patient needs continued PT services  PT Problem List Decreased strength;Decreased range of motion;Decreased activity tolerance;Decreased mobility;Decreased knowledge of use of DME;Pain       PT Treatment Interventions DME instruction;Gait training;Stair training;Functional mobility training;Therapeutic activities;Therapeutic exercise;Patient/family education    PT Goals (Current goals can be found in the Care Plan section)  Acute Rehab PT Goals Patient Stated Goal: Regain IND PT Goal Formulation:  With patient Time For Goal Achievement: 01/29/18 Potential to Achieve Goals: Good    Frequency 7X/week   Barriers to discharge        Co-evaluation               AM-PAC PT "6 Clicks" Daily Activity  Outcome Measure Difficulty turning over in bed (including adjusting bedclothes, sheets and blankets)?: Unable Difficulty moving from lying on back to sitting on the side of the bed? : Unable Difficulty sitting down on and standing up from a chair with arms (e.g., wheelchair, bedside commode, etc,.)?: Unable Help needed moving to and from a bed to chair (including a wheelchair)?: A Little Help needed walking in hospital room?: A Little Help needed climbing 3-5 steps with a railing? : A Lot 6 Click Score: 11    End of Session Equipment Utilized During Treatment: Gait belt Activity Tolerance: Patient tolerated treatment well;Patient limited by pain Patient left: in chair Nurse Communication: Mobility status PT Visit Diagnosis: Difficulty in walking, not elsewhere classified (R26.2)    Time: 2111-7356 PT Time Calculation (min) (ACUTE ONLY): 42 min   Charges:   PT Evaluation $PT Eval Low Complexity: 1 Low PT Treatments $Gait Training: 8-22 mins $Therapeutic Exercise: 8-22 mins        [place] Pager [phone] Office [phone]   Navaya Wiatrek 01/22/2018, 12:48 PM

## 2018-01-23 LAB — CBC
HEMATOCRIT: 21.6 % — AB (ref 36.0–46.0)
HEMOGLOBIN: 7.5 g/dL — AB (ref 12.0–15.0)
MCH: 33.2 pg (ref 26.0–34.0)
MCHC: 34.7 g/dL (ref 30.0–36.0)
MCV: 95.6 fL (ref 78.0–100.0)
Platelets: 163 10*3/uL (ref 150–400)
RBC: 2.26 MIL/uL — AB (ref 3.87–5.11)
RDW: 14 % (ref 11.5–15.5)
WBC: 6.8 10*3/uL (ref 4.0–10.5)

## 2018-01-23 LAB — BASIC METABOLIC PANEL
Anion gap: 4 — ABNORMAL LOW (ref 5–15)
BUN: 10 mg/dL (ref 8–23)
CHLORIDE: 105 mmol/L (ref 98–111)
CO2: 29 mmol/L (ref 22–32)
CREATININE: 0.57 mg/dL (ref 0.44–1.00)
Calcium: 8.3 mg/dL — ABNORMAL LOW (ref 8.9–10.3)
GFR calc non Af Amer: >60 mL/min (ref >60)
Glucose, Bld: 152 mg/dL — ABNORMAL HIGH (ref 70–99)
POTASSIUM: 4.5 mmol/L (ref 3.5–5.1)
SODIUM: 138 mmol/L (ref 135–145)

## 2018-01-23 MED ORDER — TRANEXAMIC ACID 650 MG PO TABS
1300.0000 mg | ORAL_TABLET | Freq: Once | ORAL | Status: AC
  Administered 2018-01-23: 1300 mg via ORAL
  Filled 2018-01-23: qty 2

## 2018-01-23 MED FILL — HYDROCODON-APAP 7.5-325: 7.5-325 | 5 days supply | Qty: 60 | Fill #0

## 2018-01-23 MED FILL — CYCLOBENZAPRINE HCL 10 MG T: 10 | 10 days supply | Qty: 30 | Fill #0

## 2018-01-23 NOTE — Progress Notes (Signed)
Physical Therapy Treatment Patient Details Name: Sarah Ballard  MRN: [id number] DOB: [birth year] Today's Date: 01/23/2018    History of Present Illness Pt s/p R THR and with hx of MI and CAD    PT Comments    Steady progress with mobility.  Pt reviewed car transfers and stairs with written instruction provided.  Follow Up Recommendations  Follow surgeon's recommendation for DC plan and follow-up therapies     Equipment Recommendations  None recommended by PT    Recommendations for Other Services OT consult     Precautions / Restrictions Precautions Precautions: Fall Restrictions Weight Bearing Restrictions: No Other Position/Activity Restrictions: WBAT    Mobility  Bed Mobility               General bed mobility comments: Pt up in chair an requests back to same  Transfers Overall transfer level: Needs assistance Equipment used: Rolling walker (2 wheeled) Transfers: Sit to/from Stand Sit to Stand: Supervision Stand pivot transfers: Supervision       General transfer comment: cues for LE management and use of UEs to self assist  Ambulation/Gait Ambulation/Gait assistance: Min guard;Supervision Gait Distance (Feet): 120 Feet Assistive device: Rolling walker (2 wheeled) Gait Pattern/deviations: Decreased step length - right;Decreased step length - left;Shuffle;Trunk flexed;Step-to pattern;Step-through pattern Gait velocity: decr   General Gait Details: cues for sequence, posture and position from RW   Stairs Stairs: Yes Stairs assistance: Min assist;Min guard Stair Management: No rails;Step to pattern;Forwards;With walker Number of Stairs: 4 General stair comments: 4 single steps with cues for sequence and RW/foot placement   Wheelchair Mobility    Modified Rankin (Stroke Patients Only)       Balance Overall balance assessment: Mild deficits observed, not formally tested                                           Cognition Arousal/Alertness: Awake/alert Behavior During Therapy: WFL for tasks assessed/performed Overall Cognitive Status: Within Functional Limits for tasks assessed                                        Exercises Total Joint Exercises Ankle Circles/Pumps: AROM;Both;20 reps;Supine Quad Sets: AROM;Both;10 reps;Supine Heel Slides: AAROM;Right;20 reps;Supine Hip ABduction/ADduction: AAROM;Right;15 reps;Supine Long Arc Quad: AROM;Right;10 reps;Seated    General Comments        Pertinent Vitals/Pain Pain Assessment: 0-10 Pain Score: 4  Pain Location: R hip Pain Descriptors / Indicators: Discomfort;Sore Pain Intervention(s): Limited activity within patient's tolerance;Monitored during session;Ice applied    Home Living                      Prior Function            PT Goals (current goals can now be found in the care plan section) Acute Rehab PT Goals Patient Stated Goal: Regain IND PT Goal Formulation: With patient Time For Goal Achievement: 01/29/18 Potential to Achieve Goals: Good Progress towards PT goals: Progressing toward goals    Frequency    7X/week      PT Plan Current plan remains appropriate    Co-evaluation              AM-PAC PT "6 Clicks" Daily Activity  Outcome Measure  Difficulty turning over in bed (including adjusting  bedclothes, sheets and blankets)?: A Lot Difficulty moving from lying on back to sitting on the side of the bed? : A Lot Difficulty sitting down on and standing up from a chair with arms (e.g., wheelchair, bedside commode, etc,.)?: A Lot Help needed moving to and from a bed to chair (including a wheelchair)?: A Little Help needed walking in hospital room?: A Little Help needed climbing 3-5 steps with a railing? : A Little 6 Click Score: 15    End of Session Equipment Utilized During Treatment: Gait belt Activity Tolerance: Patient tolerated treatment well;Patient limited by fatigue Patient  left: in chair;with call bell/phone within reach Nurse Communication: Mobility status PT Visit Diagnosis: Difficulty in walking, not elsewhere classified (R26.2)     Time: 6712-4580 PT Time Calculation (min) (ACUTE ONLY): 26 min  Charges:  $Gait Training: 8-22 mins $Therapeutic Exercise: 23-37 mins $Therapeutic Activity: 8-22 mins                     [place] Pager [phone] Office [phone]    Alisea Matte 01/23/2018, 1:01 PM

## 2018-01-23 NOTE — Progress Notes (Signed)
Physical Therapy Treatment Patient Details Name: Sarah Ballard MRN: [id number] DOB: 1956-04-08 Today's Date: 01/23/2018    History of Present Illness Pt s/p R THR and with hx of MI and CAD    PT Comments    Pt reviewed home therex program with progression and .written instruction provided.  Follow Up Recommendations  Follow surgeon's recommendation for DC plan and follow-up therapies     Equipment Recommendations  None recommended by PT    Recommendations for Other Services OT consult     Precautions / Restrictions Precautions Precautions: Fall Restrictions Weight Bearing Restrictions: No Other Position/Activity Restrictions: WBAT    Mobility  Bed Mobility               General bed mobility comments: Pt up in chair an requests back to same  Transfers Overall transfer level: Needs assistance Equipment used: Rolling walker (2 wheeled) Transfers: Sit to/from [facility] Sit to Stand: Min guard;Supervision Stand pivot transfers: Supervision       General transfer comment: cues for LE management and use of UEs to self assist  Ambulation/Gait                 Stairs             Wheelchair Mobility    Modified Rankin (Stroke Patients Only)       Balance Overall balance assessment: Mild deficits observed, not formally tested                                          Cognition Arousal/Alertness: Awake/alert Behavior During Therapy: WFL for tasks assessed/performed Overall Cognitive Status: Within Functional Limits for tasks assessed                                        Exercises Total Joint Exercises Ankle Circles/Pumps: AROM;Both;20 reps;Supine Quad Sets: AROM;Both;10 reps;Supine Heel Slides: AAROM;Right;20 reps;Supine Hip ABduction/ADduction: AAROM;Right;15 reps;Supine Long Arc Quad: AROM;Right;10 reps;Seated    General Comments        Pertinent Vitals/Pain Pain  Assessment: 0-10 Pain Score: 4  Pain Location: R hip Pain Descriptors / Indicators: Discomfort;Sore Pain Intervention(s): Limited activity within patient's tolerance;Monitored during session;Premedicated before session;Ice applied    Home Living                      Prior Function            PT Goals (current goals can now be found in the care plan section) Acute Rehab PT Goals Patient Stated Goal: Regain IND PT Goal Formulation: With patient Time For Goal Achievement: 01/29/18 Potential to Achieve Goals: Good Progress towards PT goals: Progressing toward goals    Frequency    7X/week      PT Plan Current plan remains appropriate    Co-evaluation              AM-PAC PT "6 Clicks" Daily Activity  Outcome Measure  Difficulty turning over in bed (including adjusting bedclothes, sheets and blankets)?: A Lot Difficulty moving from lying on back to sitting on the side of the bed? : A Lot Difficulty sitting down on and standing up from a chair with arms (e.g., wheelchair, bedside commode, etc,.)?: A Lot Help needed moving to and from a bed to chair (  including a wheelchair)?: A Little Help needed walking in hospital room?: A Little Help needed climbing 3-5 steps with a railing? : A Little 6 Click Score: 15    End of Session   Activity Tolerance: Patient tolerated treatment well;Patient limited by fatigue Patient left: in chair;with call bell/phone within reach Nurse Communication: Mobility status PT Visit Diagnosis: Difficulty in walking, not elsewhere classified (R26.2)     Time: 7564-3329 PT Time Calculation (min) (ACUTE ONLY): 29 min  Charges:  $Therapeutic Exercise: 23-37 mins                     [place] Pager [phone] Office [phone]    Jenine Krisher 01/23/2018, 12:54 PM

## 2018-01-23 NOTE — Progress Notes (Signed)
Patient ID: Sarah Ballard, female   DOB: Aug 03, 1955, 62 y.o.   MRN: [id number] Subjective: 2 Days Post-Op Procedure(s) (LRB): RIGHT TOTAL HIP ARTHROPLASTY ANTERIOR APPROACH (Right)    Patient reports pain as mild.  Feels weak but out of bed when I saw her going to bathroom  Objective:   VITALS:   Vitals:   01/22/18 2119 01/23/18 0540  BP: (!) 124/56 91/61  Pulse: 84 84  Resp: 16 16  Temp: 98 F (36.7 C) 98.4 F (36.9 C)  SpO2: 99% 94%    Neurovascular intact Incision: dressing C/D/I  LABS Recent Labs    01/22/18 0457 01/23/18 0451  HGB 8.6* 7.5*  HCT 24.1* 21.6*  WBC 7.8 6.8  PLT 196 163    Recent Labs    01/22/18 0457 01/23/18 0451  NA 137 138  K 3.8 4.5  BUN 8 10  CREATININE 0.68 0.57  GLUCOSE 192* 152*    No results for input(s): LABPT, INR in the last 72 hours.   Assessment/Plan: 2 Days Post-Op Procedure(s) (LRB): RIGHT TOTAL HIP ARTHROPLASTY ANTERIOR APPROACH (Right)   Up with therapy  ABLA - observation, IRON supplement for 3 weeks If does well with PT this am then plan for discharge this pm RTC in 2 weeks

## 2018-01-23 NOTE — Progress Notes (Signed)
Occupational Therapy Treatment Patient Details Name: Sarah Ballard MRN: [id number] DOB: [birth year] Today's Date: 01/23/2018    History of present illness Pt s/p R THR and with hx of MI and CAD   OT comments  Pt VERY anxious.  Pt doing well functionally but very nervous about going home.  Pt very focused on low hemoglobin (7.7)  Follow Up Recommendations  Home health OT;Supervision/Assistance - 24 hour    Equipment Recommendations  None recommended by OT    Recommendations for Other Services      Precautions / Restrictions Precautions Precautions: Fall Restrictions Weight Bearing Restrictions: No Other Position/Activity Restrictions: WBAT       Mobility Bed Mobility               General bed mobility comments: pt in chair  Transfers Overall transfer level: Needs assistance Equipment used: Rolling walker (2 wheeled) Transfers: Sit to/from Stand;Stand Pivot Transfers Sit to Stand: Min guard;Supervision Stand pivot transfers: Supervision       General transfer comment: cues for LE management and use of UEs to self assist    Balance Overall balance assessment: Mild deficits observed, not formally tested                                         ADL either performed or assessed with clinical judgement   ADL Overall ADL's : Needs assistance/impaired Eating/Feeding: Set up;Sitting   Grooming: Set up;Cueing for safety;Standing           Upper Body Dressing : Set up;Sitting   Lower Body Dressing: Minimal assistance;Sit to/from stand;Cueing for sequencing;Cueing for safety   Toilet Transfer: Minimal assistance;Min guard;RW;Comfort height toilet   Toileting- Clothing Manipulation and Hygiene: Min guard;Sit to/from stand         General ADL Comments: pt VERY anxious! Educated pt on deep breathing and relaxation texhniques.  OT provided reassurance               Cognition Arousal/Alertness: Awake/alert Behavior During Therapy:  WFL for tasks assessed/performed Overall Cognitive Status: Within Functional Limits for tasks assessed                                                     Pertinent Vitals/ Pain       Pain Score: 4  Pain Location: R hip Pain Descriptors / Indicators: Discomfort;Sore Pain Intervention(s): Limited activity within patient's tolerance;Repositioned;Monitored during session         Frequency  Min 2X/week        Progress Toward Goals  OT Goals(current goals can now be found in the care plan section)  Progress towards OT goals: Progressing toward goals     Plan Discharge plan remains appropriate    Co-evaluation                 AM-PAC PT "6 Clicks" Daily Activity     Outcome Measure   Help from another person eating meals?: None Help from another person taking care of personal grooming?: None Help from another person toileting, which includes using toliet, bedpan, or urinal?: A Little Help from another person bathing (including washing, rinsing, drying)?: A Little Help from another person to put on and taking off regular upper body clothing?:  A Little Help from another person to put on and taking off regular lower body clothing?: A Lot 6 Click Score: 19    End of Session Equipment Utilized During Treatment: Rolling walker;Gait belt  OT Visit Diagnosis: Unsteadiness on feet (R26.81);Muscle weakness (generalized) (M62.81);Pain Pain - part of body: Hip   Activity Tolerance Patient tolerated treatment well   Patient Left in chair;with call bell/phone within reach   Nurse Communication Mobility status        Time: 0712-1975 OT Time Calculation (min): 30 min  Charges: OT General Charges $OT Visit: 1 Visit OT Treatments $Self Care/Home Management : 23-37 mins  Kari Baars, [place] [phone] Office- [phone]      Jenita [place], Edwena Felty D 01/23/2018, 12:24 PM

## 2018-01-24 ENCOUNTER — Other Ambulatory Visit: Payer: Self-pay | Admitting: *Deleted

## 2018-01-24 NOTE — Patient Outreach (Signed)
[facility]) Care Management  01/24/2018  AMIREE NO 1955-08-30 [id number]   Subjective: Telephone call to patient's home  / mobile number, no answer, left HIPAA compliant voicemail message, and requested call back.    Objective: Per KPN (Knowledge Performance Now, point of care tool) and chart review, patient hospitalized 01/21/18 -01/23/18 for Right hip osteoarthritis, status post RIGHT TOTAL HIP ARTHROPLASTY ANTERIOR APPROACH on 01/21/18.   Patient also has a history of CAD, hypertension, hyperlipidemia, Hypothyroidism, Depression, and MI (myocardial infarction).         Assessment:  Received UMR Preoperative / Transition of care referral on 01/17/18.   Preoperative call completed, and Transition of care follow up pending patient contact.       Plan: RNCM will send unsuccessful outreach  letter, [facility] pamphlet, will call patient for 2nd telephone outreach attempt, transition of care follow up, and proceed with case closure, within 10 business days if no return call.         Briza Bark H. Annia Friendly, BSN, [place] [facility] Telephonic CM Phone: [phone] Fax: [phone]

## 2018-01-27 ENCOUNTER — Other Ambulatory Visit: Payer: 59 | Admitting: *Deleted

## 2018-01-27 NOTE — Discharge Summary (Signed)
Physician Discharge Summary  Patient ID: Sarah Ballard MRN: [id number] DOB/AGE: 1956/01/29 62 y.o.  Admit date: 01/21/2018 Discharge date: 01/23/2018   Procedures:  Procedure(s) (LRB): RIGHT TOTAL HIP ARTHROPLASTY ANTERIOR APPROACH (Right)  Attending Physician:  Dr. Paralee Cancel   Admission Diagnoses:   Right hip primary OA / pain  Discharge Diagnoses:  Principal Problem:   S/P right THA, AA Active Problems:   Overweight (BMI 25.0-29.9)  Past Medical History:  Diagnosis Date  . Angina pectoris ([place]) 2005   with stent placement Anterior decscending  . Anxiety   . Arthritis   . Bone spur    Right Hip  . CAD (coronary artery disease) 2005   s/p PCI LAD and cutting balloon angioplasty  with stent reexpansion 04/2008  . Colon polyps   . Degenerative disc disease   . Depression   . GERD (gastroesophageal reflux disease)   . Hypercholesterolemia   . Hyperlipemia   . Hypertension   . Hypothyroidism   . MI (myocardial infarction) ([place]) 2005   angio with stent  . Positive PPD    neg. CXR, treated with INH for 1 yr.  about 62  . UTI (urinary tract infection) 05/31/15    HPI:     Sarah Ballard, 62 y.o. female, has a history of pain and functional disability in the right hip(s) due to arthritis and patient has failed non-surgical conservative treatments for greater than 12 weeks to include NSAID's and/or analgesics, use of assistive devices and activity modification.  Onset of symptoms was gradual starting <1 year ago with rapidlly worsening course since that time.The patient noted no past surgery on the right hip(s).  Patient currently rates pain in the right hip at 10 out of 10 with activity. Patient has night pain, worsening of pain with activity and weight bearing, trendelenberg gait, pain that interfers with activities of daily living and pain with passive range of motion. Patient has evidence of periarticular osteophytes and joint space narrowing by imaging studies. This  condition presents safety issues increasing the risk of falls.  There is no current active infection.  Risks, benefits and expectations were discussed with the patient.  Risks including but not limited to the risk of anesthesia, blood clots, nerve damage, blood vessel damage, failure of the prosthesis, infection and up to and including death.  Patient understand the risks, benefits and expectations and wishes to proceed with surgery.   PCP: Shon Baton, MD   Discharged Condition: good  Hospital Course:  Patient underwent the above stated procedure on 01/21/2018. Patient tolerated the procedure well and brought to the recovery room in good condition and subsequently to the floor.  POD #1 BP: 107/85 ; Pulse: 88 ; Temp: 98.6 F (37 C) ; Resp: 16 Patient reports pain as mild, pain controlled.  No events throughout the night. States that she was a little light headed yesterday when getting up.  Dr. Alvan Dame discussed the process of recovery and what to expect.   Dorsiflexion/plantar flexion intact, incision: dressing C/D/I, no cellulitis present and compartment soft.   LABS  Basename    HGB     8.6  HCT     24.1   POD #2  BP: 91/61 ; Pulse: 84 ; Temp: 98.4 F (36.9 C) ; Resp: 16 Patient reports pain as mild.  Feels weak but out of bed when I saw her going to bathroom. Ready to be discharged home. Neurovascular intact and incision: dressing C/D/I.   Lorre Munroe  HGB     7.5  HCT     21.6    Discharge Exam: General appearance: alert, cooperative and no distress Extremities: Homans sign is negative, no sign of DVT, no edema, redness or tenderness in the calves or thighs and no ulcers, gangrene or trophic changes  Disposition:  Home with follow up in 2 weeks   Follow-up Information    Paralee Cancel, MD. Schedule an appointment as soon as possible for a visit in 2 weeks.   Specialty:  Orthopedic Surgery Contact information: [address] [place]  [address] [phone]           Discharge Instructions    Call MD / Call 911   Complete by:  As directed    If you experience chest pain or shortness of breath, CALL 911 and be transported to the hospital emergency room.  If you develope a fever above 101 F, pus (white drainage) or increased drainage or redness at the wound, or calf pain, call your surgeon's office.   Change dressing   Complete by:  As directed    Maintain surgical dressing until follow up in the clinic. If the edges start to pull up, may reinforce with tape. If the dressing is no longer working, may remove and cover with gauze and tape, but must keep the area dry and clean.  Call with any questions or concerns.   Constipation Prevention   Complete by:  As directed    Drink plenty of fluids.  Prune juice may be helpful.  You may use a stool softener, such as Colace (over the counter) 100 mg twice a day.  Use MiraLax (over the counter) for constipation as needed.   Diet - low sodium heart healthy   Complete by:  As directed    Discharge instructions   Complete by:  As directed    Maintain surgical dressing until follow up in the clinic. If the edges start to pull up, may reinforce with tape. If the dressing is no longer working, may remove and cover with gauze and tape, but must keep the area dry and clean.  Follow up in 2 weeks at [facility]. Call with any questions or concerns.   Increase activity slowly as tolerated   Complete by:  As directed    Weight bearing as tolerated with assist device (walker, cane, etc) as directed, use it as long as suggested by your surgeon or therapist, typically at least 4-6 weeks.   TED hose   Complete by:  As directed    Use stockings (TED hose) for 2 weeks on both leg(s).  You may remove them at night for sleeping.      Allergies as of 01/23/2018      Reactions   Latex    itching   Lisinopril Cough   Tramadol Hcl Nausea And Vomiting      Medication List    STOP  taking these medications   acetaminophen 500 MG tablet Commonly known as:  TYLENOL   aspirin EC 81 MG tablet   CORICIDIN HBP PO   estradiol 0.1 MG/GM vaginal cream Commonly known as:  ESTRACE   HYDROcodone-acetaminophen 5-325 MG tablet Commonly known as:  NORCO/VICODIN Replaced by:  HYDROcodone-acetaminophen 7.5-325 MG tablet   ibuprofen 200 MG tablet Commonly known as:  ADVIL,MOTRIN     TAKE these medications   ALPRAZolam 0.5 MG tablet Commonly known as:  XANAX Take 0.5 mg by mouth 2 (two) times daily.  busPIRone 5 MG tablet Commonly known as:  BUSPAR Take 5 mg by mouth 3 (three) times daily.   calcium carbonate 500 MG chewable tablet Commonly known as:  TUMS - dosed in mg elemental calcium Chew 1-2 tablets by mouth 3 (three) times daily as needed for indigestion or heartburn.   CALTRATE 600+D 600-400 MG-UNIT tablet Generic drug:  Calcium Carbonate-Vitamin D Take 1 tablet daily by mouth.   clopidogrel 75 MG tablet Commonly known as:  PLAVIX TAKE 1 TABLET BY MOUTH DAILY   cyclobenzaprine 10 MG tablet Commonly known as:  FLEXERIL Take 1 tablet (10 mg total) by mouth 3 (three) times daily as needed for muscle spasms.   docusate sodium 100 MG capsule Commonly known as:  COLACE Take 1 capsule (100 mg total) by mouth 2 (two) times daily. What changed:    when to take this  reasons to take this   DULoxetine 60 MG capsule Commonly known as:  CYMBALTA Take 60 mg by mouth daily.   ferrous sulfate 325 (65 FE) MG tablet Take 1 tablet (325 mg total) by mouth 3 (three) times daily with meals.   fluticasone 50 MCG/ACT nasal spray Commonly known as:  FLONASE Place 2 sprays into both nostrils daily.   HYDROcodone-acetaminophen 7.5-325 MG tablet Commonly known as:  NORCO Take 1-2 tablets by mouth every 4 (four) hours as needed for moderate pain. Replaces:  HYDROcodone-acetaminophen 5-325 MG tablet   irbesartan 150 MG tablet Commonly known as:  AVAPRO Take 1  tablet (150 mg total) by mouth daily.   levothyroxine 125 MCG tablet Commonly known as:  SYNTHROID, LEVOTHROID Take 125 mcg by mouth daily at 3 pm.   Magnesium 400 MG Caps Take 400 mg by mouth daily.   metoprolol tartrate 25 MG tablet Commonly known as:  LOPRESSOR TAKE 1/2 TABLET BY MOUTH TWICE A DAY   nitroGLYCERIN 0.4 MG SL tablet Commonly known as:  NITROSTAT Place 1 tablet (0.4 mg total) under the tongue every 5 (five) minutes as needed. For chest pain   NON FORMULARY Apply 1 application topically daily. [facility]  Scar Cream -  Verapamil 10%, Pentoxifylline 5% Apply 1-2 grams to affected area daily Qty. 120 gm 3 refills   omega-3 acid ethyl esters 1 g capsule Commonly known as:  LOVAZA Take 2 g by mouth 2 (two) times daily.   omeprazole 40 MG capsule Commonly known as:  PRILOSEC Take 1 capsule (40 mg total) by mouth daily. Before dinner   oxybutynin 10 MG 24 hr tablet Commonly known as:  DITROPAN-XL Take 10 mg by mouth daily.   polyethylene glycol packet Commonly known as:  MIRALAX / GLYCOLAX Take 17 g by mouth 2 (two) times daily. What changed:    when to take this  reasons to take this   rosuvastatin 20 MG tablet Commonly known as:  CRESTOR TAKE 1 TABLET (20 MG TOTAL) BY MOUTH DAILY. What changed:  when to take this            Discharge Care Instructions  (From admission, onward)         Start     Ordered   01/22/18 0000  Change dressing    Comments:  Maintain surgical dressing until follow up in the clinic. If the edges start to pull up, may reinforce with tape. If the dressing is no longer working, may remove and cover with gauze and tape, but must keep the area dry and clean.  Call with any questions or concerns.  01/22/18 2811           Signed: West Pugh. Meaghen Vecchiarelli   PA-C  01/27/2018, 11:35 PM

## 2018-01-27 NOTE — Patient Outreach (Signed)
[facility]) Care Management  01/27/2018  JAYLANNI ELTRINGHAM [birth year] [id number]   Subjective: Telephone call to patient's home / mobile number, spoke with patient, and HIPAA verified.  Discussed [facility] Care Management UMR Transition of care follow up, patient voiced understanding, and is in agreement to follow up.    Patient states she is currently resting, stayed an additional day in the hospital due to low blood pressure, had low hemoglobin, doing well overall at home, and requested call back at another time.   States she remember speaking with this RNCM in the past and looking forward to call back later.     Objective:Per KPN (Knowledge Performance Now, point of care tool) and chart review,patient hospitalized 01/21/18 -01/23/18 forRighthip osteoarthritis, status post RIGHT TOTAL HIP ARTHROPLASTY ANTERIOR APPROACH on 01/21/18. Patient also has a history of CAD, hypertension, hyperlipidemia, Hypothyroidism,Depression, andMI (myocardial infarction).       Assessment: Received UMR Preoperative / Transition of care referral on 01/17/18.Preoperative call completed, and Transition of care follow up pending patient contact.       Plan:RNCM has sent unsuccessful outreach  letter, [facility] pamphlet, will call patient for 3rd telephone outreach attempt, transition of care follow up, and proceed with case closure, within 10 business days if no return call.        Sommer Spickard H. Annia Friendly, BSN, [place] Management [facility] Telephonic CM Phone: [phone] Fax: [phone]

## 2018-01-28 ENCOUNTER — Other Ambulatory Visit: Payer: Self-pay | Admitting: *Deleted

## 2018-01-28 ENCOUNTER — Encounter: Payer: Self-pay | Admitting: *Deleted

## 2018-01-28 NOTE — Patient Outreach (Addendum)
[facility] [place] [facility]) Care Management  01/28/2018  Sarah Ballard December 26, 1955 [id number]   Subjective: Telephone call to patient's home / mobile number, spoke with patient, and HIPAA verified.  Discussed [facility] Care Management UMR Transition of care follow up, patient voiced understanding, and is in agreement to follow up.  Received a voicemail message from patient states she is returning call and requested call back.  Telephone call to patient's home / mobile number, spoke with patient, and HIPAA verified.  Discussed [facility] Care Management UMR Transition of care follow up, patient voiced understanding, and is in agreement to follow up.   Patient states she is doing good, having a lot of pain, pushing through the pain as needed, managing with medications, increasing activities of daily living as tolerated, mobility is good, and able to perform home exercise program without difficulty. States she is aware of signs / symptoms to report to MD and will follow up with MD as needed.  States she has a follow up appointment with surgeon on 02/05/18.   States her hemoglobin was 7.5 on discharge from hospital, is taking iron three times a day with meals, tires easily, will continue to monitor activities tolerance, and blood pressure.  States she has a blood pressure cuff and last blood pressure reading was 126/88 on 01/27/18.  Patient has been taking Buspar 5 mg bid versus tid per pre hospitalization treatment plan and she will call primary MD today to verify frequency.  Patient states she is able to manage self care and has assistance as needed from family / friends with activities of daily living / home management.   [facility] benefits discussed on 01/17/18 preoperative call and patient states no additional questions at this time.  States is planning to utilize the [employer] benefit / resource for her father on 03/02/18.  Patient states she does not have any education material, transition of care, care  coordination, disease management, disease monitoring, transportation, community resource, or pharmacy needs at this time.  States she is very appreciative of the follow up and is in agreement to receive [place] Management information.      Objective:Per KPN (Knowledge Performance Now, point of care tool) and chart review,patienthospitalized9/24/19 -9/26/19forRighthip osteoarthritis, status postRIGHT TOTAL HIP ARTHROPLASTY ANTERIOR APPROACHon 01/21/18. Patient also has a history of CAD, hypertension, hyperlipidemia, Hypothyroidism,Depression, andMI (myocardial infarction).       Assessment: Received UMR Preoperative / Transition of care referral on 01/17/18.Preoperative call completed, andTransition of care follow up completed, no care management needs, and will proceed with case closure.       Plan:RNCM will send patient successful outreach letter, [facility] pamphlet, and magnet. RNCM will complete case closure due to follow up completed / no care management needs.         Ruchama Kubicek H. Annia Friendly, BSN, [place] Management [facility] Telephonic CM Phone: [phone] Fax: [phone]

## 2018-02-05 MED FILL — HYDROCODON-APAP 5-325: 5-325 | 5 days supply | Qty: 60 | Fill #0

## 2018-02-05 MED FILL — CYCLOBENZAPRINE HCL 10 MG T: 10 | 13 days supply | Qty: 40 | Fill #0

## 2018-02-06 MED FILL — SULFAMETHOXAZOLE-TMP DS TAB: 800-160 | 10 days supply | Qty: 20 | Fill #0

## 2018-02-18 DIAGNOSIS — Z23 Encounter for immunization: Secondary | ICD-10-CM | POA: Diagnosis not present

## 2018-02-18 MED FILL — CLOPIDOGREL 75 MG TABLET: 75 | 90 days supply | Qty: 90 | Fill #2

## 2018-02-18 MED FILL — ALPRAZolam 0.5 MG TABS: 0.5 | 20 days supply | Qty: 60 | Fill #0 | Status: TO

## 2018-02-21 DIAGNOSIS — F419 Anxiety disorder, unspecified: Secondary | ICD-10-CM | POA: Diagnosis not present

## 2018-02-21 DIAGNOSIS — F329 Major depressive disorder, single episode, unspecified: Secondary | ICD-10-CM | POA: Diagnosis not present

## 2018-02-25 ENCOUNTER — Other Ambulatory Visit: Payer: Self-pay | Admitting: Gastroenterology

## 2018-02-25 MED FILL — OMEPRAZOLE 40 MG CPDR: 40 | 30 days supply | Qty: 30 | Fill #0 | Status: TO

## 2018-02-26 DIAGNOSIS — Z1231 Encounter for screening mammogram for malignant neoplasm of breast: Secondary | ICD-10-CM | POA: Diagnosis not present

## 2018-03-04 ENCOUNTER — Telehealth: Payer: Self-pay | Admitting: Certified Nurse Midwife

## 2018-03-04 NOTE — Telephone Encounter (Signed)
Patient recently finished Bactrim for UTI and has developed yeast infection. She is having severe itching. Patient states she is bleeding from scratching Would like prescription for Diflucan, but does not want to be seen. I let patient know that she will probably need to come in for an office visit. If calling in prescription, patient uses [facility].

## 2018-03-04 NOTE — Telephone Encounter (Signed)
Call to patient. Has had hip surgery and took bactrim a couple of weeks ago.  Has possible yeast and external irritation. Has tried vaseline and Monistat without improvement. Advised needs office visit for evaluation. Scheduled to see Debbi on Friday, 03-06-18. Offered to move up annual to tomorrow.Appointment with Dr Talbert Nan for annual and evaluation.   Encounter closed.

## 2018-03-05 ENCOUNTER — Encounter: Payer: Self-pay | Admitting: Obstetrics and Gynecology

## 2018-03-05 ENCOUNTER — Ambulatory Visit (INDEPENDENT_AMBULATORY_CARE_PROVIDER_SITE_OTHER): Payer: 59 | Admitting: Obstetrics and Gynecology

## 2018-03-05 ENCOUNTER — Other Ambulatory Visit: Payer: Self-pay

## 2018-03-05 VITALS — BP 104/68 | HR 68 | Ht 64.5 in | Wt 168.2 lb

## 2018-03-05 DIAGNOSIS — N952 Postmenopausal atrophic vaginitis: Secondary | ICD-10-CM

## 2018-03-05 DIAGNOSIS — L309 Dermatitis, unspecified: Secondary | ICD-10-CM | POA: Diagnosis not present

## 2018-03-05 DIAGNOSIS — Z01419 Encounter for gynecological examination (general) (routine) without abnormal findings: Secondary | ICD-10-CM

## 2018-03-05 DIAGNOSIS — Z124 Encounter for screening for malignant neoplasm of cervix: Secondary | ICD-10-CM

## 2018-03-05 DIAGNOSIS — Z96641 Presence of right artificial hip joint: Secondary | ICD-10-CM | POA: Diagnosis not present

## 2018-03-05 DIAGNOSIS — Z471 Aftercare following joint replacement surgery: Secondary | ICD-10-CM | POA: Diagnosis not present

## 2018-03-05 DIAGNOSIS — N760 Acute vaginitis: Secondary | ICD-10-CM

## 2018-03-05 MED ORDER — BETAMETHASONE VALERATE 0.1 % EX OINT: TOPICAL_OINTMENT | CUTANEOUS | 0 refills | Status: DC

## 2018-03-05 MED ORDER — ESTRADIOL 0.1 MG/GM VA CREA: TOPICAL_CREAM | VAGINAL | 1 refills | Status: DC

## 2018-03-05 MED FILL — BETAMETHASONE VALER 0.1% OI: 0.1 | 14 days supply | Qty: 15 | Fill #0

## 2018-03-05 MED FILL — ESTRADIOL 0.1 MG/GM CREA: 0.1 | 37 days supply | Qty: 43 | Fill #0

## 2018-03-05 NOTE — Progress Notes (Signed)
62 y.o. [id number] Divorced White or Caucasian Not Hispanic or Latino female here for annual exam.   No vaginal bleeding. Not sexually active.   She is 6 weeks post op from a hip replacement, was treated for a UTI, now thinks she has a vaginal yeast infection. She started having significant vaginal itching this weekend, no vaginal discharge. The itching has extended to the perianal itching.   She has had a bad year. 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 She took some time off. She had surgery for a bunion, has nerve injury (mostly healed). 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 She has been taking care of her Father with dementia. He has lived with her for 20 years, she can no longer leave him for long periods of time. Very stressful. She isn't sleeping well, worrying about him.  She is seeing a Software engineer. On medication.   She has a h/o uterine prolapse, grade 2, tolerable. She has some trouble emptying her bladder, will often need to double void. She is on Oxybutin for leakage, it has helped. Still needs to wear a mini-pad, leaks on the way to the bathroom a couple of times a month.  She does feel like she empties her bladder. No GSI. She tried pelvic floor PT.   Some issues with constipation. Takes peri-colace, helps  She has a h/o a MI, has stents, HTN on plavix.   H/O atrophic vaginitis, uses estrace cream intermittently for comfort. Not sexually active.     Patient's last menstrual period was 06/29/2006 (approximate).          Sexually active: No.  The current method of family planning is post menopausal status, tubal ligation.  Exercising: No.  The patient does not participate in regular exercise at present. Smoker:  no  Health Maintenance: Pap:     10/05/14, Negative with neg HR HPV             09/10/11, Negative History of Abnormal Pap: no MMG: 02/23/16, 3D-yes, Density Category B, Bi-Rads 2:   Benign Just had it 02/26/18 at [facility] Colonoscopy: 07/08/15, Normal, repeat in 10 years BMD: 02/28/15 T Score: -0.1 Spine / -0.8 Right Femur Neck / -0.8 Left Femur Neck TDaP: 05/10/15 Gardasil: N/A   reports that she has never smoked. She has never used smokeless tobacco. She reports that she drinks about 1.0 standard drinks of alcohol per week. She reports that she does not use drugs. 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 3 kids, daughters are local, son in [place]. 26 and 49 year old grandchildren are local.   Past Medical History:  Diagnosis Date  . Angina pectoris ([place]) 2005   with stent placement Anterior decscending  . Anxiety   . Arthritis   . Bone spur    Right Hip  . CAD (coronary artery disease) 2005   s/p PCI LAD and cutting balloon angioplasty  with stent reexpansion 04/2008  . Colon polyps   . Degenerative disc disease   . Depression   . GERD (gastroesophageal reflux disease)   . Hypercholesterolemia   . Hyperlipemia   . Hypertension   . Hypothyroidism   . MI (myocardial infarction) ([place] 2005   angio with stent  . Positive PPD    neg. CXR, treated with INH for 1 yr.  about 1982  . UTI (urinary tract infection) 05/31/15    Past Surgical History:  Procedure Laterality Date  . BUNIONECTOMY    . COLONOSCOPY    . CORONARY ANGIOPLASTY  2009  . CORONARY ANGIOPLASTY WITH STENT PLACEMENT  2005  . HEMORRHOID SURGERY  06/2015   Removal   . LAPAROSCOPY     for endometriosis  . LEFT HEART CATHETERIZATION WITH CORONARY ANGIOGRAM Bilateral 06/27/2011   Procedure: LEFT HEART CATHETERIZATION WITH CORONARY ANGIOGRAM;  Surgeon: Burnell Blanks, MD;  Location: [facility] CATH LAB;  Service: Cardiovascular;  Laterality: Bilateral;  . LUMBAR EPIDURAL INJECTION     many  . NERVE ROOT BLOCK     epidural steroids; multiple  . POLYPECTOMY    . ruptured disc  2007   with epidural   . TONGUE BIOPSY     keratin  accumulation - Benign   . TONSILLECTOMY    . TOTAL HIP ARTHROPLASTY Right 01/21/2018   Procedure: RIGHT TOTAL HIP ARTHROPLASTY ANTERIOR APPROACH;  Surgeon: Paralee Cancel, MD;  Location: [facility] ORS;  Service: Orthopedics;  Laterality: Right;  67mins  . TUBAL LIGATION      Current Outpatient Medications  Medication Sig Dispense Refill  . ALPRAZolam (XANAX) 0.5 MG tablet Take 0.5 mg by mouth 2 (two) times daily.     . busPIRone (BUSPAR) 5 MG tablet Take 5 mg by mouth 3 (three) times daily.    . calcium carbonate (TUMS - DOSED IN MG ELEMENTAL CALCIUM) 500 MG chewable tablet Chew 1-2 tablets by mouth 3 (three) times daily as needed for indigestion or heartburn.    . Calcium Carbonate-Vitamin D (CALTRATE 600+D) 600-400 MG-UNIT per tablet Take 1 tablet daily by mouth.     . clopidogrel (PLAVIX) 75 MG tablet TAKE 1 TABLET BY MOUTH DAILY (Patient taking differently: Take 75 mg by mouth daily. ) 90 tablet 3  . cyclobenzaprine (FLEXERIL) 10 MG tablet Take 1 tablet (10 mg total) by mouth 3 (three) times daily as needed for muscle spasms. 30 tablet 0  . DULoxetine (CYMBALTA) 60 MG capsule Take 60 mg by mouth daily.    . fluticasone (FLONASE) 50 MCG/ACT nasal spray Place 2 sprays into both nostrils daily.     Marland Kitchen HYDROcodone-acetaminophen (NORCO) 7.5-325 MG tablet Take 1-2 tablets by mouth every 4 (four) hours as needed for moderate pain. 60 tablet 0  . irbesartan (AVAPRO) 150 MG tablet Take 1 tablet (150 mg total) by mouth daily. 90 tablet 3  . levothyroxine (SYNTHROID, LEVOTHROID) 125 MCG tablet Take 125 mcg by mouth daily at 3 pm.     . Magnesium 400 MG CAPS Take 400 mg by mouth daily.    . metoprolol tartrate (LOPRESSOR) 25 MG tablet TAKE 1/2 TABLET BY MOUTH TWICE A DAY (Patient taking differently: Take 12.5 mg by mouth 2 (two) times daily. ) 90 tablet 3  . nitroGLYCERIN (NITROSTAT) 0.4 MG SL tablet Place 1 tablet (0.4 mg total) under the tongue every 5 (five) minutes as needed. For chest pain 25 tablet 6  .  NON FORMULARY Apply 1 application topically daily. [facility]  Scar Cream -  Verapamil 10%, Pentoxifylline 5% Apply 1-2 grams to affected area daily Qty. 120 gm 3 refills    . omega-3 acid ethyl esters (LOVAZA) 1 G capsule Take 2 g by mouth 2 (two) times daily.      Marland Kitchen omeprazole (PRILOSEC) 40 MG capsule TAKE 1 CAPSULE BY MOUTH DAILY. BEFORE DINNER 30 capsule 2  . oxybutynin (DITROPAN-XL) 10  MG 24 hr tablet Take 10 mg by mouth daily.  11  . rosuvastatin (CRESTOR) 20 MG tablet TAKE 1 TABLET (20 MG TOTAL) BY MOUTH DAILY. (Patient taking differently: Take 20 mg by mouth at bedtime. ) 90 tablet 3  . docusate sodium (COLACE) 100 MG capsule Take 1 capsule (100 mg total) by mouth 2 (two) times daily. (Patient not taking: Reported on 03/05/2018) 10 capsule 0  . ferrous sulfate (FERROUSUL) 325 (65 FE) MG tablet Take 1 tablet (325 mg total) by mouth 3 (three) times daily with meals. (Patient not taking: Reported on 03/05/2018)  3  . polyethylene glycol (MIRALAX / GLYCOLAX) packet Take 17 g by mouth 2 (two) times daily. (Patient not taking: Reported on 03/05/2018) 14 each 0   No current facility-administered medications for this visit.     Family History  Problem Relation Age of Onset  . Colon cancer Mother 11  . Stroke Mother   . Hypertension Mother   . Diabetes Mother   . Rheum arthritis Mother   . Atrial fibrillation Mother   . Heart attack Father        x2  . Diabetes Father   . Kidney disease Father   . Heart disease Father   . Stroke Maternal Grandmother   . Colon cancer Paternal Grandmother   . Diabetes Paternal Grandmother   . Esophageal cancer Cousin 61    Review of Systems  Constitutional: Negative.   HENT: Negative.   Eyes: Negative.   Respiratory: Negative.   Cardiovascular: Negative.   Gastrointestinal: Negative.   Endocrine: Negative.   Genitourinary:       Vaginal itching  Musculoskeletal: Negative.   Skin: Negative.   Allergic/Immunologic: Negative.    Neurological: Negative.   Hematological: Negative.   Psychiatric/Behavioral: Negative.     Exam:   BP 104/Sarah (BP Location: Right Arm, Patient Position: Sitting, Cuff Size: Normal)   Pulse Sarah   Ht 5' 4.5" (1.638 m)   Wt 168 lb 3.2 oz (76.3 kg)   LMP 06/29/2006 (Approximate)   BMI 28.43 kg/m   Weight change: @WEIGHTCHANGE @ Height:   Height: 5' 4.5" (163.8 cm)  Ht Readings from Last 3 Encounters:  03/05/18 5' 4.5" (1.638 m)  01/21/18 5\' 6"  (1.676 m)  01/13/18 5\' 6"  (1.676 m)    General appearance: alert, cooperative and appears stated age Head: Normocephalic, without obvious abnormality, atraumatic Neck: no adenopathy, supple, symmetrical, trachea midline and thyroid normal to inspection and palpation Lungs: clear to auscultation bilaterally Cardiovascular: regular rate and rhythm Breasts: normal appearance, no masses or tenderness Abdomen: soft, non-tender; non distended,  no masses,  no organomegaly Extremities: extremities normal, atraumatic, no cyanosis or edema Skin: Skin color, texture, turgor normal. No rashes or lesions Lymph nodes: Cervical, supraclavicular, and axillary nodes normal. No abnormal inguinal nodes palpated Neurologic: Grossly normal   Pelvic: External genitalia:  no lesions, atrophic, loss of architecture, no whitening.               Urethra:  normal appearing urethra with no masses, tenderness or lesions              Bartholins and Skenes: normal                 Vagina: atrophic appearing vagina with normal color no discharge, no lesions              Cervix: no lesions               Bimanual  Exam:  Uterus:  normal size, contour, position, consistency, mobility, non-tender              Adnexa: no mass, fullness, tenderness               Rectovaginal: Confirms               Anus:  normal sphincter tone, no lesions. Perianal erythema  Chaperone was present for exam.  A:  Well Woman with normal exam  Vulvovaginitis   Perianal dermatitis  Atrophic  vulvovaginitis  P:   Pap with hpv  Discussed breast self exam  Discussed calcium and vit D intake  DEXA UTD  Mammogram just done, will get report  Colonoscopy UTD  Estrace cream  Steroid ointment for peri-anal dermatitis  Affirm sent

## 2018-03-05 NOTE — Patient Instructions (Signed)
EXERCISE AND DIET:  We recommended that you start or continue a regular exercise program for good health. Regular exercise means any activity that makes your heart beat faster and makes you sweat.  We recommend exercising at least 30 minutes per day at least 3 days a week, preferably 4 or 5.  We also recommend a diet low in fat and sugar.  Inactivity, poor dietary choices and obesity can cause diabetes, heart attack, stroke, and kidney damage, among others.    ALCOHOL AND SMOKING:  Women should limit their alcohol intake to no more than 7 drinks/beers/glasses of wine (combined, not each!) per week. Moderation of alcohol intake to this level decreases your risk of breast cancer and liver damage. And of course, no recreational drugs are part of a healthy lifestyle.  And absolutely no smoking or even second hand smoke. Most people know smoking can cause heart and lung diseases, but did you know it also contributes to weakening of your bones? Aging of your skin?  Yellowing of your teeth and nails?  CALCIUM AND VITAMIN D:  Adequate intake of calcium and Vitamin D are recommended.  The recommendations for exact amounts of these supplements seem to change often, but generally speaking 1,200 mg of calcium (either carbonate or citrate) and 800 units of Vitamin D per day seems prudent. Certain women may benefit from higher intake of Vitamin D.  If you are among these women, your doctor will have told you during your visit.    PAP SMEARS:  Pap smears, to check for cervical cancer or precancers,  have traditionally been done yearly, although recent scientific advances have shown that most women can have pap smears less often.  However, every woman still should have a physical exam from her gynecologist every year. It will include a breast check, inspection of the vulva and vagina to check for abnormal growths or skin changes, a visual exam of the cervix, and then an exam to evaluate the size and shape of the uterus and  ovaries.  And after 63 years of age, a rectal exam is indicated to check for rectal cancers. We will also provide age appropriate advice regarding health maintenance, like when you should have certain vaccines, screening for sexually transmitted diseases, bone density testing, colonoscopy, mammograms, etc.   MAMMOGRAMS:  All women over 40 years old should have a yearly mammogram. Many facilities now offer a "3D" mammogram, which may cost around $50 extra out of pocket. If possible,  we recommend you accept the option to have the 3D mammogram performed.  It both reduces the number of women who will be called back for extra views which then turn out to be normal, and it is better than the routine mammogram at detecting truly abnormal areas.    COLONOSCOPY:  Colonoscopy to screen for colon cancer is recommended for all women at age 50.  We know, you hate the idea of the prep.  We agree, BUT, having colon cancer and not knowing it is worse!!  Colon cancer so often starts as a polyp that can be seen and removed at colonscopy, which can quite literally save your life!  And if your first colonoscopy is normal and you have no family history of colon cancer, most women don't have to have it again for 10 years.  Once every ten years, you can do something that may end up saving your life, right?  We will be happy to help you get it scheduled when you are ready.    Be sure to check your insurance coverage so you understand how much it will cost.  It may be covered as a preventative service at no cost, but you should check your particular policy.      Breast Self-Awareness Breast self-awareness means being familiar with how your breasts look and feel. It involves checking your breasts regularly and reporting any changes to your health care provider. Practicing breast self-awareness is important. A change in your breasts can be a sign of a serious medical problem. Being familiar with how your breasts look and feel allows  you to find any problems early, when treatment is more likely to be successful. All women should practice breast self-awareness, including women who have had breast implants. How to do a breast self-exam One way to learn what is normal for your breasts and whether your breasts are changing is to do a breast self-exam. To do a breast self-exam: Look for Changes  1. Remove all the clothing above your waist. 2. Stand in front of a mirror in a room with good lighting. 3. Put your hands on your hips. 4. Push your hands firmly downward. 5. Compare your breasts in the mirror. Look for differences between them (asymmetry), such as: ? Differences in shape. ? Differences in size. ? Puckers, dips, and bumps in one breast and not the other. 6. Look at each breast for changes in your skin, such as: ? Redness. ? Scaly areas. 7. Look for changes in your nipples, such as: ? Discharge. ? Bleeding. ? Dimpling. ? Redness. ? A change in position. Feel for Changes  Carefully feel your breasts for lumps and changes. It is best to do this while lying on your back on the floor and again while sitting or standing in the shower or tub with soapy water on your skin. Feel each breast in the following way:  Place the arm on the side of the breast you are examining above your head.  Feel your breast with the other hand.  Start in the nipple area and make  inch (2 cm) overlapping circles to feel your breast. Use the pads of your three middle fingers to do this. Apply light pressure, then medium pressure, then firm pressure. The light pressure will allow you to feel the tissue closest to the skin. The medium pressure will allow you to feel the tissue that is a little deeper. The firm pressure will allow you to feel the tissue close to the ribs.  Continue the overlapping circles, moving downward over the breast until you feel your ribs below your breast.  Move one finger-width toward the center of the body.  Continue to use the  inch (2 cm) overlapping circles to feel your breast as you move slowly up toward your collarbone.  Continue the up and down exam using all three pressures until you reach your armpit.  Write Down What You Find  Write down what is normal for each breast and any changes that you find. Keep a written record with breast changes or normal findings for each breast. By writing this information down, you do not need to depend only on memory for size, tenderness, or location. Write down where you are in your menstrual cycle, if you are still menstruating. If you are having trouble noticing differences in your breasts, do not get discouraged. With time you will become more familiar with the variations in your breasts and more comfortable with the exam. How often should I examine my breasts? Examine   your breasts every month. If you are breastfeeding, the best time to examine your breasts is after a feeding or after using a breast pump. If you menstruate, the best time to examine your breasts is 5-7 days after your period is over. During your period, your breasts are lumpier, and it may be more difficult to notice changes. When should I see my health care provider? See your health care provider if you notice:  A change in shape or size of your breasts or nipples.  A change in the skin of your breast or nipples, such as a reddened or scaly area.  Unusual discharge from your nipples.  A lump or thick area that was not there before.  Pain in your breasts.  Anything that concerns you.  This information is not intended to replace advice given to you by your health care provider. Make sure you discuss any questions you have with your health care provider. Document Released: 04/16/2005 Document Revised: 09/22/2015 Document Reviewed: 03/06/2015 Elsevier Interactive Patient Education  2018 Elsevier Inc.  

## 2018-03-06 ENCOUNTER — Other Ambulatory Visit (HOSPITAL_COMMUNITY)
Admission: RE | Admit: 2018-03-06 | Discharge: 2018-03-06 | Disposition: A | Discharge status: Home / Self Care | Payer: 59 | Source: Ambulatory Visit | Attending: Obstetrics and Gynecology | Admitting: Obstetrics and Gynecology

## 2018-03-06 DIAGNOSIS — Z124 Encounter for screening for malignant neoplasm of cervix: Secondary | ICD-10-CM | POA: Insufficient documentation

## 2018-03-06 LAB — VAGINITIS/VAGINOSIS, DNA PROBE
CANDIDA SPECIES: NEGATIVE
GARDNERELLA VAGINALIS: NEGATIVE
TRICHOMONAS VAG: NEGATIVE

## 2018-03-06 NOTE — Addendum Note (Signed)
Addended by: Dorothy Spark on: 03/06/2018 05:43 PM   Modules accepted: Orders

## 2018-03-07 ENCOUNTER — Ambulatory Visit: Payer: 59 | Admitting: Certified Nurse Midwife

## 2018-03-10 LAB — CYTOLOGY - PAP
Diagnosis: NEGATIVE
HPV (WINDOPATH): NOT DETECTED

## 2018-03-11 DIAGNOSIS — N3941 Urge incontinence: Secondary | ICD-10-CM | POA: Diagnosis not present

## 2018-03-11 DIAGNOSIS — N3946 Mixed incontinence: Secondary | ICD-10-CM | POA: Diagnosis not present

## 2018-03-11 MED FILL — IRBESARTAN 150 MG TAB: 150 | 90 days supply | Qty: 90 | Fill #0

## 2018-03-11 MED FILL — OXYBUTYNIN CL ER 10 MG TAB: 10 | 90 days supply | Qty: 90 | Fill #0

## 2018-03-18 DIAGNOSIS — F329 Major depressive disorder, single episode, unspecified: Secondary | ICD-10-CM | POA: Diagnosis not present

## 2018-03-18 DIAGNOSIS — F419 Anxiety disorder, unspecified: Secondary | ICD-10-CM | POA: Diagnosis not present

## 2018-03-21 DIAGNOSIS — D2371 Other benign neoplasm of skin of right lower limb, including hip: Secondary | ICD-10-CM | POA: Diagnosis not present

## 2018-03-21 DIAGNOSIS — L578 Other skin changes due to chronic exposure to nonionizing radiation: Secondary | ICD-10-CM | POA: Diagnosis not present

## 2018-03-21 DIAGNOSIS — L570 Actinic keratosis: Secondary | ICD-10-CM | POA: Diagnosis not present

## 2018-03-21 DIAGNOSIS — D692 Other nonthrombocytopenic purpura: Secondary | ICD-10-CM | POA: Diagnosis not present

## 2018-03-21 DIAGNOSIS — D225 Melanocytic nevi of trunk: Secondary | ICD-10-CM | POA: Diagnosis not present

## 2018-03-21 DIAGNOSIS — D2372 Other benign neoplasm of skin of left lower limb, including hip: Secondary | ICD-10-CM | POA: Diagnosis not present

## 2018-03-24 MED FILL — ALPRAZolam 0.5 MG TABS: 0.5 | 20 days supply | Qty: 60 | Fill #0

## 2018-04-01 DIAGNOSIS — F4323 Adjustment disorder with mixed anxiety and depressed mood: Secondary | ICD-10-CM | POA: Diagnosis not present

## 2018-04-01 MED FILL — OMEPRAZOLE 40 MG CPDR: 40 | 30 days supply | Qty: 30 | Fill #0

## 2018-04-03 DIAGNOSIS — F329 Major depressive disorder, single episode, unspecified: Secondary | ICD-10-CM | POA: Diagnosis not present

## 2018-04-03 DIAGNOSIS — F419 Anxiety disorder, unspecified: Secondary | ICD-10-CM | POA: Diagnosis not present

## 2018-04-11 ENCOUNTER — Other Ambulatory Visit: Payer: Self-pay | Admitting: Cardiovascular Disease

## 2018-04-11 NOTE — Telephone Encounter (Signed)
Called pt back and let her know that she has enough refills in the system up until she sees her dr in 06/2018. Pt thanked me for the call.

## 2018-04-11 NOTE — Telephone Encounter (Signed)
  Patient would like her pharmacy information updated to [facility] [address] at [facility]   *STAT* If patient is at the pharmacy, call can be transferred to refill team.   1. Which medications need to be refilled? (please list name of each medication and dose if known)  nitroGLYCERIN (NITROSTAT) 0.4 MG SL tablet clopidogrel (PLAVIX) 75 MG tablet rosuvastatin (CRESTOR) 20 MG tablet metoprolol tartrate (LOPRESSOR) 25 MG tablet irbesartan (AVAPRO) 150 MG tablet  2. Which pharmacy/location (including street and city if local pharmacy) is medication to be sent to? [facility]  3. Do they need a 30 day or 90 day supply? [place]

## 2018-04-14 ENCOUNTER — Encounter: Payer: Self-pay | Admitting: Obstetrics and Gynecology

## 2018-04-15 MED FILL — DULOXETINE HCL 60 MG CPEP: 60 | 90 days supply | Qty: 90 | Fill #0

## 2018-04-21 MED FILL — LEVOTHYROXINE 112 MCG TAB: 112 | 90 days supply | Qty: 90 | Fill #1

## 2018-04-21 MED FILL — ALPRAZolam 0.5 MG TABS: 0.5 | 20 days supply | Qty: 60 | Fill #1

## 2018-04-24 MED FILL — OMEGA-3 ETHYL ESTER 1 GM CA: 1 | 90 days supply | Qty: 360 | Fill #0

## 2018-04-24 MED FILL — ROSUVASTATIN CALCIUM 20 MG: 20 | 90 days supply | Qty: 90 | Fill #3

## 2018-04-24 MED FILL — METOPROLOL TARTRATE 25 MG T: 25 | 90 days supply | Qty: 90 | Fill #1

## 2018-04-24 MED FILL — busPIRone HCL 5 MG TABS: 5 | 90 days supply | Qty: 180 | Fill #0

## 2018-04-29 ENCOUNTER — Telehealth: Payer: Self-pay | Admitting: *Deleted

## 2018-04-29 NOTE — Telephone Encounter (Signed)
[facility] and left a message on Dr. Donell Sievert assistant's vmial to call back with anesthesia type.      [facility] Pre-operative Risk Assessment    Request for surgical clearance:  1. What type of surgery is being performed? LUMBAR SPINE TRANSF RIGHT [id number]   2. When is this surgery scheduled?  05/29/18   3. What type of clearance is required (medical clearance vs. Pharmacy clearance to hold med vs. Both)? PHARMACY  4. Are there any medications that need to be held prior to surgery and how long? PLAVIX FOR 7 DAYS   5. Practice name and name of physician performing surgery?  [facility] DR. HARKINS  6. What is your office phone number [phone]    7.   What is your office fax number [phone]  8.   Anesthesia type (None, local, MAC, general) ?  HAVE LEFT A MESSAGE ON SURGEONS MACHINE TO CALL BACK WITH ANESTHESIA FOR SX.   Sarah Ballard 04/29/2018, 11:10 AM  _________________________________________________________________   (provider comments below)

## 2018-04-29 NOTE — Telephone Encounter (Signed)
Dr Acie Fredrickson can you comment on request to hold Plavix in this patient ?  Kerin Ransom PA-C 04/29/2018 3:36 PM

## 2018-04-30 NOTE — Telephone Encounter (Signed)
Sarah Ballard may hold her Plavix for 5 days prior to surgery She is at low risk She should restart the plavix when given the OK from the surgical team

## 2018-05-01 NOTE — Telephone Encounter (Signed)
   Primary Cardiologist: Mertie Moores, MD  Chart reviewed as part of pre-operative protocol coverage. Given past medical history and time since last visit, based on ACC/AHA guidelines, Sarah Ballard would be at acceptable risk for the planned procedure without further cardiovascular testing.   OK to hold Plavix 5 days pre op- resume when OK with the surgical team.  I will route this recommendation to the requesting party via [place] fax function and remove from pre-op pool.  Please call with questions.  Kerin Ransom, PA-C 05/01/2018, 2:58 PM

## 2018-05-02 ENCOUNTER — Telehealth: Payer: Self-pay | Admitting: *Deleted

## 2018-05-02 NOTE — Telephone Encounter (Signed)
Received fax today for clearance. Reviewed chart and clearance and was sent yesterday. Left message if did not receive clearance to please call back.

## 2018-05-06 ENCOUNTER — Telehealth: Payer: Self-pay | Admitting: Cardiovascular Disease

## 2018-05-06 NOTE — Telephone Encounter (Signed)
Follow Up:      They will using Local  [facility]est[facility]

## 2018-05-08 NOTE — Telephone Encounter (Signed)
Clearance faxed again in case not received. Will remove from preop pool.

## 2018-05-12 NOTE — Telephone Encounter (Addendum)
   Primary Cardiologist: Mertie Moores, MD  Chart reviewed as part of pre-operative protocol coverage.   Will forward to Dr. Acie Fredrickson to make sure he is OK with 7 days. Of note for a different procedure he has previously requested she go on ASA if possible while off Plavix. Await input on this. Not clear if this would be possible with surgery listed below.  Dr. Acie Fredrickson - - Please route response to P [facility] DIV PREOP (the pre-op pool). Thank you.   Charlie Pitter, PA-C 05/12/2018, 1:56 PM

## 2018-05-12 NOTE — Telephone Encounter (Signed)
OK to hold Plavix for 7 days prior to spinal surgery  Also OK to hold ASA for 7 days prior to surgery  She is at low risk for her spinal surgery

## 2018-05-12 NOTE — Telephone Encounter (Addendum)
Spoke with Neurosurgery office, they did not receive the fax, it should be sent to [phone] and they also requested that Plavix be held for 7 days, not 5 days. Sending to Pre-op.

## 2018-05-12 NOTE — Telephone Encounter (Signed)
   Primary Cardiologist:Philip Nahser, MD  Chart reviewed as part of pre-operative protocol coverage. Pre-op clearance already addressed by colleagues in earlier phone notes. To summarize recommendations:  - Per Kerin Ransom, PA-C "Chart reviewed as part of pre-operative protocol coverage. Given past medical history and time since last visit, based on ACC/AHA guidelines, Sarah Ballard would be at acceptable risk for the planned procedure without further cardiovascular testing. "  - Per Dr. Acie Fredrickson, "OK to hold Plavix for 7 days prior to spinal surgery." Patient is not on aspirin per our records.  Will route this bundled recommendation to requesting provider via Epic fax function. Please call with questions.  Charlie Pitter, PA-C 05/12/2018, 2:52 PM

## 2018-06-13 ENCOUNTER — Other Ambulatory Visit: Payer: Self-pay | Admitting: Gastroenterology

## 2018-06-23 DIAGNOSIS — F4320 Adjustment disorder, unspecified: Secondary | ICD-10-CM | POA: Diagnosis not present

## 2018-06-30 DIAGNOSIS — F4320 Adjustment disorder, unspecified: Secondary | ICD-10-CM | POA: Diagnosis not present

## 2018-07-03 DIAGNOSIS — M1831 Unilateral post-traumatic osteoarthritis of first carpometacarpal joint, right hand: Secondary | ICD-10-CM | POA: Diagnosis not present

## 2018-07-14 DIAGNOSIS — F4320 Adjustment disorder, unspecified: Secondary | ICD-10-CM | POA: Diagnosis not present

## 2018-07-16 ENCOUNTER — Ambulatory Visit: Payer: 59 | Admitting: Cardiovascular Disease

## 2018-07-17 ENCOUNTER — Other Ambulatory Visit: Payer: Self-pay

## 2018-07-17 MED ORDER — METOPROLOL TARTRATE 25 MG PO TABS: 12.5000 mg | ORAL_TABLET | Freq: Two times a day (BID) | ORAL | 1 refills | Status: DC

## 2018-07-28 DIAGNOSIS — F4320 Adjustment disorder, unspecified: Secondary | ICD-10-CM | POA: Diagnosis not present

## 2018-08-11 DIAGNOSIS — F4320 Adjustment disorder, unspecified: Secondary | ICD-10-CM | POA: Diagnosis not present

## 2018-08-15 ENCOUNTER — Other Ambulatory Visit: Payer: Self-pay | Admitting: Cardiovascular Disease

## 2018-08-18 ENCOUNTER — Other Ambulatory Visit: Payer: Self-pay | Admitting: Cardiovascular Disease

## 2018-08-18 MED ORDER — ROSUVASTATIN CALCIUM 20 MG PO TABS: 20.0000 mg | ORAL_TABLET | Freq: Every day | ORAL | 0 refills | Status: DC

## 2018-08-18 MED ORDER — CLOPIDOGREL BISULFATE 75 MG PO TABS: 75.0000 mg | ORAL_TABLET | Freq: Every day | ORAL | 0 refills | Status: DC

## 2018-08-25 DIAGNOSIS — F419 Anxiety disorder, unspecified: Secondary | ICD-10-CM | POA: Diagnosis not present

## 2018-08-25 DIAGNOSIS — F329 Major depressive disorder, single episode, unspecified: Secondary | ICD-10-CM | POA: Diagnosis not present

## 2018-09-10 ENCOUNTER — Other Ambulatory Visit: Payer: Self-pay | Admitting: Cardiovascular Disease

## 2018-09-11 ENCOUNTER — Other Ambulatory Visit: Payer: Self-pay | Admitting: Cardiovascular Disease

## 2018-09-11 MED ORDER — IRBESARTAN 150 MG PO TABS: 150.0000 mg | ORAL_TABLET | Freq: Every day | ORAL | 0 refills | Status: DC

## 2018-09-11 NOTE — Telephone Encounter (Signed)
Pt's medication was sent to pt's pharmacy as requested. Confirmation received.  °

## 2018-09-11 NOTE — Telephone Encounter (Signed)
 *  STAT* If patient is at the pharmacy, call can be transferred to refill team.   1. Which medications need to be refilled? (please list name of each medication and dose if known) irbesartan (AVAPRO) 150 MG tablet  2. Which pharmacy/location (including street and city if local pharmacy) is medication to be sent to? [facility]  3. Do they need a 30 day or 90 day supply? 90 days if possible

## 2018-09-14 ENCOUNTER — Other Ambulatory Visit: Payer: Self-pay | Admitting: Cardiovascular Disease

## 2018-09-15 DIAGNOSIS — F4320 Adjustment disorder, unspecified: Secondary | ICD-10-CM | POA: Diagnosis not present

## 2018-10-13 DIAGNOSIS — F329 Major depressive disorder, single episode, unspecified: Secondary | ICD-10-CM | POA: Diagnosis not present

## 2018-10-13 DIAGNOSIS — F419 Anxiety disorder, unspecified: Secondary | ICD-10-CM | POA: Diagnosis not present

## 2018-10-13 DIAGNOSIS — F4320 Adjustment disorder, unspecified: Secondary | ICD-10-CM | POA: Diagnosis not present

## 2018-10-23 DIAGNOSIS — M1831 Unilateral post-traumatic osteoarthritis of first carpometacarpal joint, right hand: Secondary | ICD-10-CM | POA: Diagnosis not present

## 2018-10-27 DIAGNOSIS — F329 Major depressive disorder, single episode, unspecified: Secondary | ICD-10-CM | POA: Diagnosis not present

## 2018-10-27 DIAGNOSIS — F4320 Adjustment disorder, unspecified: Secondary | ICD-10-CM | POA: Diagnosis not present

## 2018-10-27 DIAGNOSIS — F419 Anxiety disorder, unspecified: Secondary | ICD-10-CM | POA: Diagnosis not present

## 2018-11-10 DIAGNOSIS — F4320 Adjustment disorder, unspecified: Secondary | ICD-10-CM | POA: Diagnosis not present

## 2018-11-13 ENCOUNTER — Other Ambulatory Visit: Payer: Self-pay | Admitting: Cardiovascular Disease

## 2018-11-19 ENCOUNTER — Other Ambulatory Visit: Payer: Self-pay | Admitting: Cardiovascular Disease

## 2018-11-20 ENCOUNTER — Other Ambulatory Visit: Payer: Self-pay | Admitting: *Deleted

## 2018-11-20 MED ORDER — CLOPIDOGREL BISULFATE 75 MG PO TABS: 75.0000 mg | ORAL_TABLET | Freq: Every day | ORAL | 0 refills | Status: DC

## 2018-12-01 DIAGNOSIS — F4320 Adjustment disorder, unspecified: Secondary | ICD-10-CM | POA: Diagnosis not present

## 2018-12-01 DIAGNOSIS — F329 Major depressive disorder, single episode, unspecified: Secondary | ICD-10-CM | POA: Diagnosis not present

## 2018-12-02 ENCOUNTER — Telehealth: Payer: Self-pay

## 2018-12-02 NOTE — Telephone Encounter (Signed)
Patient returned call, she changed her appt to virtual appt.

## 2018-12-02 NOTE — Telephone Encounter (Signed)
YOUR CARDIOLOGY TEAM HAS ARRANGED FOR AN E-VISIT FOR YOUR APPOINTMENT - PLEASE REVIEW IMPORTANT INFORMATION BELOW SEVERAL DAYS PRIOR TO YOUR APPOINTMENT  Due to the recent COVID-19 pandemic, we are transitioning in-person office visits to tele-medicine visits in an effort to decrease unnecessary exposure to our patients, their families, and staff. These visits are billed to your insurance just like a normal visit is. We also encourage you to sign up for MyChart if you have not already done so. You will need a smartphone if possible. For patients that do not have this, we can still complete the visit using a regular telephone but do prefer a smartphone to enable video when possible. You may have a family member that lives with you that can help. If possible, we also ask that you have a blood pressure cuff and scale at home to measure your blood pressure, heart rate and weight prior to your scheduled appointment. Patients with clinical needs that need an in-person evaluation and testing will still be able to come to the office if absolutely necessary. If you have any questions, feel free to call our office.     YOUR PROVIDER WILL BE USING THE FOLLOWING PLATFORM TO COMPLETE YOUR VISIT: [email or web address]  . IF USING MYCHART - How to Download the MyChart App to Your SmartPhone   - If Apple, go to App Store and type in MyChart in the search bar and download the app. If Android, ask patient to go to Google Play Store and type in MyChart in the search bar and download the app. The app is free but as with any other app downloads, your phone may require you to verify saved payment information or Apple/Android password.  - You will need to then log into the app with your MyChart username and password, and select [facility] as your healthcare provider to link the account.  - When it is time for your visit, go to the MyChart app, find appointments, and click Begin Video Visit. Be sure to Select Allow for your device to  access the Microphone and Camera for your visit. You will then be connected, and your provider will be with you shortly.  **If you have any issues connecting or need assistance, please contact MyChart service desk [phone] ([phone])**  **If using a computer, in order to ensure the best quality for your visit, you will need to use either of the following Internet Browsers: Google Chrome or Microsoft Edge**  . IF USING DOXIMITY or [email or web address].ME - The staff will give you instructions on receiving your link to join the meeting the day of your visit.      2-3 DAYS BEFORE YOUR APPOINTMENT  You will receive a telephone call from one of our [facility] team members - your caller ID may say "Unknown caller." If this is a video visit, we will walk you through how to get the video launched on your phone. We will remind you check your blood pressure, heart rate and weight prior to your scheduled appointment. If you have an Apple Watch or Kardia, please upload any pertinent ECG strips the day before or morning of your appointment to MyChart. Our staff will also make sure you have reviewed the consent and agree to move forward with your scheduled tele-health visit.     THE DAY OF YOUR APPOINTMENT  Approximately 15 minutes prior to your scheduled appointment, you will receive a telephone call from one of Heart[facility] team - your caller ID may say "Unknown caller."    Our staff will confirm medications, vital signs for the day and any symptoms you may be experiencing. Please have this information available prior to the time of visit start. It may also be helpful for you to have a pad of paper and pen handy for any instructions given during your visit. They will also walk you through joining the smartphone meeting if this is a video visit.    CONSENT FOR TELE-HEALTH VISIT - PLEASE REVIEW  I hereby voluntarily request, consent and authorize [facility] and its employed or contracted physicians, physician  assistants, nurse practitioners or other licensed health care professionals (the Practitioner), to provide me with telemedicine health care services (the "Services") as deemed necessary by the treating Practitioner. I acknowledge and consent to receive the Services by the Practitioner via telemedicine. I understand that the telemedicine visit will involve communicating with the Practitioner through live audiovisual communication technology and the disclosure of certain medical information by electronic transmission. I acknowledge that I have been given the opportunity to request an in-person assessment or other available alternative prior to the telemedicine visit and am voluntarily participating in the telemedicine visit.  I understand that I have the right to withhold or withdraw my consent to the use of telemedicine in the course of my care at any time, without affecting my right to future care or treatment, and that the Practitioner or I may terminate the telemedicine visit at any time. I understand that I have the right to inspect all information obtained and/or recorded in the course of the telemedicine visit and may receive copies of available information for a reasonable fee.  I understand that some of the potential risks of receiving the Services via telemedicine include:  . Delay or interruption in medical evaluation due to technological equipment failure or disruption; . Information transmitted may not be sufficient (e.g. poor resolution of images) to allow for appropriate medical decision making by the Practitioner; and/or  . In rare instances, security protocols could fail, causing a breach of personal health information.  Furthermore, I acknowledge that it is my responsibility to provide information about my medical history, conditions and care that is complete and accurate to the best of my ability. I acknowledge that Practitioner's advice, recommendations, and/or decision may be based on  factors not within their control, such as incomplete or inaccurate data provided by me or distortions of diagnostic images or specimens that may result from electronic transmissions. I understand that the practice of medicine is not an exact science and that Practitioner makes no warranties or guarantees regarding treatment outcomes. I acknowledge that I will receive a copy of this consent concurrently upon execution via email to the email address I last provided but may also request a printed copy by calling the office of [facility].    I understand that my insurance will be billed for this visit.   I have read or had this consent read to me. . I understand the contents of this consent, which adequately explains the benefits and risks of the Services being provided via telemedicine.  . I have been provided ample opportunity to ask questions regarding this consent and the Services and have had my questions answered to my satisfaction. . I give my informed consent for the services to be provided through the use of telemedicine in my medical care  By participating in this telemedicine visit I agree to the above.  

## 2018-12-02 NOTE — Telephone Encounter (Signed)
Attempted to reach pt to change in office appointment with Dr.Nahser on 8/5 to a virtual visit. No answer. Lvm

## 2018-12-03 ENCOUNTER — Other Ambulatory Visit: Payer: Self-pay | Admitting: Cardiovascular Disease

## 2018-12-03 ENCOUNTER — Telehealth (INDEPENDENT_AMBULATORY_CARE_PROVIDER_SITE_OTHER): Payer: BLUE CROSS/BLUE SHIELD | Admitting: Cardiovascular Disease

## 2018-12-03 ENCOUNTER — Other Ambulatory Visit: Payer: Self-pay

## 2018-12-03 VITALS — BP 113/75 | HR 98 | Ht 65.5 in | Wt 172.0 lb

## 2018-12-03 DIAGNOSIS — I2510 Atherosclerotic heart disease of native coronary artery without angina pectoris: Secondary | ICD-10-CM

## 2018-12-03 DIAGNOSIS — E782 Mixed hyperlipidemia: Secondary | ICD-10-CM | POA: Diagnosis not present

## 2018-12-03 DIAGNOSIS — Z7189 Other specified counseling: Secondary | ICD-10-CM

## 2018-12-03 DIAGNOSIS — I10 Essential (primary) hypertension: Secondary | ICD-10-CM

## 2018-12-03 NOTE — Progress Notes (Signed)
Virtual Visit via Video Note   This visit type was conducted due to national recommendations for restrictions regarding the COVID-19 Pandemic (e.g. social distancing) in an effort to limit this patient's exposure and mitigate transmission in our community.  Due to her co-morbid illnesses, this patient is at least at moderate risk for complications without adequate follow up.  This format is felt to be most appropriate for this patient at this time.  All issues noted in this document were discussed and addressed.  A limited physical exam was performed with this format.  Please refer to the patient's chart for her consent to telehealth for [facility].   Date:  12/03/2018   ID:  Sarah Ballard, DOB 11-27-1955, MRN [id number]  Patient Location: Home Provider Location: Home  PCP:  Shon Baton, MD  Cardiologist:  Mertie Moores, MD  Electrophysiologist:  None   Problem List Problem List 1. Coronary artery disease-  2. anxiety 3. Hyperlipidemia 4. Hypertension 5. Hypothyroidism 6. Chronic back pain     The 63 yo  female with a history of coronary artery disease. She's status post PTCA and stenting of her left anterior descending artery. She also had [place] procedure and reexpansion of that stent in January 2010.  She was admitted recently to the hospital for chest pain an dizziness and cath revealed no significant irregularities.  He is walking twice a week. She's not having episodes of angina with walking. 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  October 27, 2012:  [name] is doing well from a cardiac standpoint.   She is having some back pain - needs to have a back injection but does not want to stop the Plavix.    October 27, 2013:  [name] is doing well.  She denies any chest pain.  Bumping into lots of things and has bruising on her legs.   Her potassium was low at her last check.    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  Asked about stopping the plavix   Jan. 24, 2017:  [name] is doing ok Has been losing weight.   Lots of stress with taking care of her father and her divorce and her son .  No CP .     November 17, 2015:  Doing ok.  Sleepy .  Had to work late last night . No CP,  Breathing is good Lots of anxiety with work stress.   July 12, 2016:  Doing ok from a cardiac standpoint Has gained some weight .  Rare episodes of CP - she thinks its due to stress or indigestion,   its not related to exertion  requests a refill of her NTG  Is trying to move  Is having lots of artheritis   Pain  Is falling lots at work   Nov. 14, 2018  Has  Moved since I last saw her. Is having some chest pain  Thinks its GERD or anxeity.  Lots of stress  Lots of stress related to the move.    The CP are atypical , do not feel like her previouis angina Takes NTG - does not really relieve the CP Finds [employer] work   Is planning on having bunion surgery at the end of this year Will be at low risk Will need to hold Plavix  for 5 days prior to surgery .    Father has gained some fluid .   Feb. 27, 2019 Doing well Is very appreciative that I sent her to see Sarah Ballard for her anxiety .  Father recently had the flu  [name] has had a cough / cold.   Developed quickly. Has been seen by Dr. 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    December 26, 2017:  [name] is seen today for follow-up visit. MRI of her hip shows that she needs to have a hip replacement Sees Dr. Alvan Ballard . No angina     Evaluation Performed:  Follow-Up Visit  Chief Complaint:  CAD   Aug. 5, 2020    Sarah Ballard is a 63 y.o. female with CAD. Lots of depression since the death of her father and son on the same day .  No angina cp.  Has grief related cp.   Has tried to exercise.  Walks her dog.     The patient does not have symptoms concerning for COVID-19 infection (fever, chills, cough, or new shortness of breath).    Past Medical History:  Diagnosis Date  . Angina pectoris ([place]) 2005   with stent placement Anterior decscending  . Anxiety   . Arthritis   . Bone spur    Right Hip  . CAD (coronary artery disease) 2005   s/p PCI LAD and cutting balloon angioplasty  with stent reexpansion 04/2008  . Colon polyps   . Degenerative disc disease   . Depression   . GERD (gastroesophageal reflux disease)   . Hypercholesterolemia   . Hyperlipemia   . Hypertension   . Hypothyroidism   . MI (myocardial infarction) ([place]) 2005   angio with stent  . Positive PPD    neg. CXR, treated with INH for 1 yr.  about 61  . UTI (urinary tract infection) 05/31/15   Past Surgical History:  Procedure Laterality Date  . BUNIONECTOMY    . COLONOSCOPY    . CORONARY ANGIOPLASTY  2009  . CORONARY ANGIOPLASTY WITH STENT PLACEMENT  2005  . HEMORRHOID SURGERY  06/2015   Removal   . LAPAROSCOPY     for endometriosis  . LEFT HEART CATHETERIZATION WITH CORONARY ANGIOGRAM Bilateral 06/27/2011   Procedure: LEFT HEART CATHETERIZATION WITH CORONARY ANGIOGRAM;  Surgeon: Burnell Blanks, MD;  Location: [facility] CATH LAB;  Service: Cardiovascular;  Laterality: Bilateral;  . LUMBAR EPIDURAL INJECTION     many  . NERVE ROOT BLOCK     epidural steroids; multiple  . POLYPECTOMY    . ruptured disc  2007   with epidural   . TONGUE BIOPSY     keratin accumulation - Benign   . TONSILLECTOMY    . TOTAL HIP ARTHROPLASTY Right 01/21/2018   Procedure: RIGHT TOTAL HIP ARTHROPLASTY ANTERIOR APPROACH;  Surgeon: Sarah Cancel, MD;  Location: [facility] ORS;  Service: Orthopedics;  Laterality: Right;  62mins  . TUBAL LIGATION       Current Meds  Medication Sig  . ALPRAZolam (XANAX) 0.5 MG tablet Take 0.5 mg by mouth 2 (two) times daily.   Marland Kitchen amitriptyline (ELAVIL) 10 MG tablet Take 20 mg by mouth at bedtime.  .  betamethasone valerate ointment (VALISONE) 0.1 % Apply a pea sized amount topically BID for 1-2 weeks as needed. Not for daily long term use.  . busPIRone (BUSPAR) 5 MG tablet Take 5 mg by mouth 2 (two) times daily.   Marland Kitchen  calcium carbonate (TUMS - DOSED IN MG ELEMENTAL CALCIUM) 500 MG chewable tablet Chew 1-2 tablets by mouth 3 (three) times daily as needed for indigestion or heartburn.  . Calcium Carbonate-Vitamin D (CALTRATE 600+D) 600-400 MG-UNIT per tablet Take 1 tablet daily by mouth.   . clopidogrel (PLAVIX) 75 MG tablet Take 1 tablet (75 mg total) by mouth daily. Patient must keep upcoming appt with Dr. Acie Fredrickson for further refills.  . cyclobenzaprine (FLEXERIL) 10 MG tablet Take 1 tablet (10 mg total) by mouth 3 (three) times daily as needed for muscle spasms.  Marland Kitchen docusate sodium (COLACE) 100 MG capsule Take 1 capsule (100 mg total) by mouth 2 (two) times daily.  . DULoxetine (CYMBALTA) 30 MG capsule Take 30 mg by mouth 3 (three) times daily.  Marland Kitchen estradiol (ESTRACE) 0.1 MG/GM vaginal cream 1 gram vaginally twice weekly  . fluticasone (FLONASE) 50 MCG/ACT nasal spray Place 2 sprays into both nostrils daily.   Marland Kitchen HYDROcodone-acetaminophen (NORCO) 7.5-325 MG tablet Take 1-2 tablets by mouth every 4 (four) hours as needed for moderate pain.  Marland Kitchen irbesartan (AVAPRO) 150 MG tablet Take 1 tablet (150 mg total) by mouth daily. Please keep upcoming appt with Dr. Acie Fredrickson in August for future refills. Thank you  . levothyroxine (SYNTHROID) 112 MCG tablet Take 112 mcg by mouth daily before breakfast.  . Magnesium 400 MG CAPS Take 400 mg by mouth daily.  . metoprolol tartrate (LOPRESSOR) 25 MG tablet Take 0.5 tablets (12.5 mg total) by mouth 2 (two) times daily.  . nitroGLYCERIN (NITROSTAT) 0.4 MG SL tablet Place 1 tablet (0.4 mg total) under the tongue every 5 (five) minutes as needed. For chest pain  . NON FORMULARY Apply 1 application topically daily. [facility]  Scar Cream -  Verapamil 10%,  Pentoxifylline 5% Apply 1-2 grams to affected area daily Qty. 120 gm 3 refills  . omega-3 acid ethyl esters (LOVAZA) 1 G capsule Take 2 g by mouth 2 (two) times daily.    Marland Kitchen omeprazole (PRILOSEC) 40 MG capsule TAKE ONE CAPSULE BY MOUTH DAILY BEFORE DINNER  . oxybutynin (DITROPAN-XL) 10 MG 24 hr tablet Take 10 mg by mouth daily.  . polyethylene glycol (MIRALAX / GLYCOLAX) packet Take 17 g by mouth 2 (two) times daily.  . rosuvastatin (CRESTOR) 20 MG tablet TAKE ONE TABLET BY MOUTH EVERY NIGHT AT BEDTIME *MUST KEEPT YEARLY APPOINTMENT WITH DR. Acie Fredrickson IN AUGUST FOR FUTURE REFILLS*     Allergies:   Latex, Lisinopril, and Tramadol hcl   Social History   Tobacco Use  . Smoking status: Never Smoker  . Smokeless tobacco: Never Used  Substance Use Topics  . Alcohol use: Yes    Alcohol/week: 1.0 standard drinks    Types: 1 Glasses of wine per week    Comment: occ wine  . Drug use: No     Family Hx: The patient's family history includes Atrial fibrillation in her mother; Colon cancer in her paternal grandmother; Colon cancer (age of onset: 46) in her mother; Diabetes in her father, mother, and paternal grandmother; Esophageal cancer (age of onset: 52) in her cousin; Heart attack in her father; Heart disease in her father; Hypertension in her mother; Kidney disease in her father; Rheum arthritis in her mother; Stroke in her maternal grandmother and mother.  ROS:   Please see the history of present illness.     All other systems reviewed and are negative.   Prior CV studies:   The following studies were reviewed today:  Labs/Other Tests and Data Reviewed:    EKG:  No ECG reviewed.  Recent Labs: 12/26/2017: ALT 22 01/23/2018: BUN 10; Creatinine, Ser 0.57; Hemoglobin 7.5; Platelets 163; Potassium 4.5; Sodium 138   Recent Lipid Panel Lab Results  Component Value Date/Time   CHOL 152 12/26/2017 11:20 AM   TRIG 55 12/26/2017 11:20 AM   HDL 60 12/26/2017 11:20 AM   CHOLHDL 2.5  12/26/2017 11:20 AM   CHOLHDL 2.1 11/17/2015 09:20 AM   LDLCALC 81 12/26/2017 11:20 AM    Wt Readings from Last 3 Encounters:  12/03/18 172 lb (78 kg)  03/05/18 168 lb 3.2 oz (76.3 kg)  01/21/18 167 lb (75.8 kg)     Objective:    Vital Signs:  BP 113/75   Pulse 98   Ht 5' 5.5" (1.664 m)   Wt 172 lb (78 kg)   LMP 06/29/2006 (Approximate)   BMI 28.19 kg/m    VITAL SIGNS:  reviewed GEN:  very depressed today .  tearful.   NAD  EYES:  sclerae anicteric, EOMI - Extraocular Movements Intact RESPIRATORY:  normal respiratory effort, symmetric expansion CARDIOVASCULAR:  no peripheral edema SKIN:  no rash, lesions or ulcers. MUSCULOSKELETAL:  no obvious deformities. NEURO:  alert and oriented x 3, no obvious focal deficit very depressed .     ASSESSMENT & PLAN:    1. Coronary artery disease: [name] is doing well from a cardiac standpoint.  She is not had any episodes of chest pain or shortness of breath.  She exercises only very rarely now.  She has been out walking her dog but needs to walk further.  I encouraged her to get out and walk on a regular basis.  I am very pleased with her cardiac status at this point.  2.  Hyperlipidemia: She will have labs with Dr. Virgina Ballard soon.  I have asked her to send [place] those labs.  3.  Hypertension: I encouraged her to work on weight loss.    COVID-19 Education: The signs and symptoms of COVID-19 were discussed with the patient and how to seek care for testing (follow up with PCP or arrange E-visit).  The importance of social distancing was discussed today.  Time:   Today, I have spent  25  minutes with the patient with telehealth technology discussing the above problems.     Medication Adjustments/Labs and Tests Ordered: Current medicines are reviewed at length with the patient today.  Concerns regarding medicines are outlined above.   Tests Ordered: No orders of the defined types were placed in this encounter.   Medication Changes: No  orders of the defined types were placed in this encounter.   Follow Up:  Virtual Visit or In Person in 6 month(s)  Signed, Mertie Moores, MD  12/03/2018 9:59 AM    [place]

## 2018-12-03 NOTE — Patient Instructions (Signed)

## 2018-12-07 ENCOUNTER — Other Ambulatory Visit: Payer: Self-pay | Admitting: Gastroenterology

## 2018-12-08 ENCOUNTER — Telehealth: Payer: Self-pay | Admitting: Cardiovascular Disease

## 2018-12-08 NOTE — Telephone Encounter (Signed)
Spoke with patient who states Vascepa is too expensive. States she will continue to take Lovaza and will agrees that she is open to try again in the future if the price decreases. She thanked me for the call.

## 2018-12-08 NOTE — Telephone Encounter (Signed)
New message     Patient states she dicussed maybe starting to take Vascepa, but upon research it's way to expensive so she wants to continue taking Lovaza.  Please call back to discuss.

## 2018-12-11 DIAGNOSIS — E038 Other specified hypothyroidism: Secondary | ICD-10-CM | POA: Diagnosis not present

## 2018-12-11 DIAGNOSIS — E7849 Other hyperlipidemia: Secondary | ICD-10-CM | POA: Diagnosis not present

## 2018-12-11 DIAGNOSIS — Z0000 Encounter for general adult medical examination without abnormal findings: Secondary | ICD-10-CM | POA: Diagnosis not present

## 2018-12-11 DIAGNOSIS — I10 Essential (primary) hypertension: Secondary | ICD-10-CM | POA: Diagnosis not present

## 2018-12-12 DIAGNOSIS — I10 Essential (primary) hypertension: Secondary | ICD-10-CM | POA: Diagnosis not present

## 2018-12-12 DIAGNOSIS — R82998 Other abnormal findings in urine: Secondary | ICD-10-CM | POA: Diagnosis not present

## 2018-12-15 DIAGNOSIS — F419 Anxiety disorder, unspecified: Secondary | ICD-10-CM | POA: Diagnosis not present

## 2018-12-15 DIAGNOSIS — F329 Major depressive disorder, single episode, unspecified: Secondary | ICD-10-CM | POA: Diagnosis not present

## 2018-12-29 DIAGNOSIS — F329 Major depressive disorder, single episode, unspecified: Secondary | ICD-10-CM | POA: Diagnosis not present

## 2018-12-29 DIAGNOSIS — F4320 Adjustment disorder, unspecified: Secondary | ICD-10-CM | POA: Diagnosis not present

## 2019-01-01 DIAGNOSIS — F4320 Adjustment disorder, unspecified: Secondary | ICD-10-CM | POA: Diagnosis not present

## 2019-01-01 DIAGNOSIS — F329 Major depressive disorder, single episode, unspecified: Secondary | ICD-10-CM | POA: Diagnosis not present

## 2019-01-01 DIAGNOSIS — Z0000 Encounter for general adult medical examination without abnormal findings: Secondary | ICD-10-CM | POA: Diagnosis not present

## 2019-01-01 DIAGNOSIS — M2010 Hallux valgus (acquired), unspecified foot: Secondary | ICD-10-CM | POA: Diagnosis not present

## 2019-01-01 DIAGNOSIS — Z1339 Encounter for screening examination for other mental health and behavioral disorders: Secondary | ICD-10-CM | POA: Diagnosis not present

## 2019-01-01 DIAGNOSIS — D692 Other nonthrombocytopenic purpura: Secondary | ICD-10-CM | POA: Diagnosis not present

## 2019-01-01 DIAGNOSIS — Z23 Encounter for immunization: Secondary | ICD-10-CM | POA: Diagnosis not present

## 2019-01-11 ENCOUNTER — Other Ambulatory Visit: Payer: Self-pay | Admitting: Gastroenterology

## 2019-01-12 ENCOUNTER — Telehealth: Payer: Self-pay | Admitting: Gastroenterology

## 2019-01-12 ENCOUNTER — Other Ambulatory Visit: Payer: Self-pay | Admitting: Cardiovascular Disease

## 2019-01-12 MED ORDER — OMEPRAZOLE 40 MG PO CPDR: 40.0000 mg | DELAYED_RELEASE_CAPSULE | Freq: Every day | ORAL | 3 refills | Status: DC

## 2019-01-12 NOTE — Telephone Encounter (Signed)
Pt requested 90 day supply.

## 2019-01-12 NOTE — Telephone Encounter (Signed)
Med sent to pharmacy as requested Pt must keep follow up appointment

## 2019-01-14 DIAGNOSIS — F329 Major depressive disorder, single episode, unspecified: Secondary | ICD-10-CM | POA: Diagnosis not present

## 2019-01-14 DIAGNOSIS — F419 Anxiety disorder, unspecified: Secondary | ICD-10-CM | POA: Diagnosis not present

## 2019-01-16 DIAGNOSIS — Z1212 Encounter for screening for malignant neoplasm of rectum: Secondary | ICD-10-CM | POA: Diagnosis not present

## 2019-01-31 ENCOUNTER — Other Ambulatory Visit: Payer: Self-pay | Admitting: Cardiovascular Disease

## 2019-02-02 DIAGNOSIS — F329 Major depressive disorder, single episode, unspecified: Secondary | ICD-10-CM | POA: Diagnosis not present

## 2019-02-02 DIAGNOSIS — F419 Anxiety disorder, unspecified: Secondary | ICD-10-CM | POA: Diagnosis not present

## 2019-02-09 DIAGNOSIS — F4320 Adjustment disorder, unspecified: Secondary | ICD-10-CM | POA: Diagnosis not present

## 2019-02-09 DIAGNOSIS — F4310 Post-traumatic stress disorder, unspecified: Secondary | ICD-10-CM | POA: Diagnosis not present

## 2019-02-17 ENCOUNTER — Other Ambulatory Visit: Payer: Self-pay | Admitting: Cardiovascular Disease

## 2019-02-17 MED ORDER — NITROGLYCERIN 0.4 MG SL SUBL: 0.4000 mg | SUBLINGUAL_TABLET | SUBLINGUAL | 6 refills | Status: DC | PRN

## 2019-02-19 DIAGNOSIS — M1811 Unilateral primary osteoarthritis of first carpometacarpal joint, right hand: Secondary | ICD-10-CM | POA: Diagnosis not present

## 2019-02-23 ENCOUNTER — Ambulatory Visit: Payer: Self-pay | Admitting: Gastroenterology

## 2019-02-23 ENCOUNTER — Other Ambulatory Visit: Payer: Self-pay

## 2019-02-23 ENCOUNTER — Encounter: Payer: Self-pay | Admitting: Gastroenterology

## 2019-02-23 VITALS — BP 128/90 | HR 96 | Temp 98.3°F | Ht 64.75 in | Wt 171.1 lb

## 2019-02-23 DIAGNOSIS — Z800 Family history of malignant neoplasm of digestive organs: Secondary | ICD-10-CM

## 2019-02-23 DIAGNOSIS — Z634 Disappearance and death of family member: Secondary | ICD-10-CM

## 2019-02-23 DIAGNOSIS — F4310 Post-traumatic stress disorder, unspecified: Secondary | ICD-10-CM | POA: Diagnosis not present

## 2019-02-23 DIAGNOSIS — F4321 Adjustment disorder with depressed mood: Secondary | ICD-10-CM

## 2019-02-23 DIAGNOSIS — K219 Gastro-esophageal reflux disease without esophagitis: Secondary | ICD-10-CM

## 2019-02-23 DIAGNOSIS — K5904 Chronic idiopathic constipation: Secondary | ICD-10-CM

## 2019-02-23 MED ORDER — PANTOPRAZOLE SODIUM 40 MG PO TBEC: 40.0000 mg | DELAYED_RELEASE_TABLET | Freq: Every day | ORAL | 3 refills | Status: DC

## 2019-02-23 NOTE — Progress Notes (Signed)
Sarah Ballard    AO:[id number]    [birth year]  Primary Care Physician:Russo, Jenny Reichmann, MD  Referring Physician: Shon Baton, MD [address] [place],  [place] [address]   Chief complaint:  GERD, constipation  HPI: 63 year old female with history of chronic GERD and constipation for follow-up  Family history or colon cancer Colonoscopy: 07/08/2015: Internal hemorrhoids otherwise unremarkable exam  Colonoscopy was in 2012 by Dr Olevia Perches 1 sessile polyp removed was polypoid normal mucosa with no adenoma. Prior to that she had a normal colonoscopy in 2006. Her mother had colon cancer at age 73 and paternal grandmother had colon cancer in her 55s.  GERD: She is taking Prilosec over-the-counter.  Denies any dysphagia, odynophagia, vomiting, abdominal pain, melena or blood per rectum She feels reflux symptoms are worse in the past few months, is under significant grief.  She recently lost her father and also her son on the same day. Chronic constipation, is taking daily Colace and MiraLAX 1-2 times daily as needed.  Outpatient Encounter Medications as of 02/23/2019  Medication Sig  . ALPRAZolam (XANAX) 0.5 MG tablet Take 0.5 mg by mouth 2 (two) times daily.   Marland Kitchen amitriptyline (ELAVIL) 10 MG tablet Take 20 mg by mouth at bedtime.  . betamethasone valerate ointment (VALISONE) 0.1 % Apply a pea sized amount topically BID for 1-2 weeks as needed. Not for daily long term use.  . busPIRone (BUSPAR) 5 MG tablet Take 5 mg by mouth 2 (two) times daily.   . calcium carbonate (TUMS - DOSED IN MG ELEMENTAL CALCIUM) 500 MG chewable tablet Chew 1-2 tablets by mouth 3 (three) times daily as needed for indigestion or heartburn.  . Calcium Carbonate-Vitamin D (CALTRATE 600+D) 600-400 MG-UNIT per tablet Take 1 tablet daily by mouth.   . clopidogrel (PLAVIX) 75 MG tablet Take 1 tablet (75 mg total) by mouth daily. Patient must keep upcoming appt with Dr. Acie Fredrickson for further refills.  . cyclobenzaprine  (FLEXERIL) 10 MG tablet Take 1 tablet (10 mg total) by mouth 3 (three) times daily as needed for muscle spasms.  Marland Kitchen docusate sodium (COLACE) 100 MG capsule Take 1 capsule (100 mg total) by mouth 2 (two) times daily.  . DULoxetine (CYMBALTA) 30 MG capsule Take 30 mg by mouth 3 (three) times daily.  Marland Kitchen estradiol (ESTRACE) 0.1 MG/GM vaginal cream 1 gram vaginally twice weekly  . fluticasone (FLONASE) 50 MCG/ACT nasal spray Place 2 sprays into both nostrils daily.   Marland Kitchen HYDROcodone-acetaminophen (NORCO) 7.5-325 MG tablet Take 1-2 tablets by mouth every 4 (four) hours as needed for moderate pain.  Marland Kitchen irbesartan (AVAPRO) 150 MG tablet TAKE ONE TABLET BY MOUTH DAILY**PLEASE KEEP UPCOMING APPOINTMENT WITH DR. Acie Fredrickson IN AUGUST FOR FUTURE REFILLS**  . levothyroxine (SYNTHROID) 112 MCG tablet Take 112 mcg by mouth daily before breakfast.  . Magnesium 400 MG CAPS Take 400 mg by mouth daily.  . metoprolol tartrate (LOPRESSOR) 25 MG tablet TAKE 1/2 TABLET (12.5MG ) TWO TIMES A DAY WITH FOOD  . omega-3 acid ethyl esters (LOVAZA) 1 G capsule Take 2 g by mouth 2 (two) times daily.    Marland Kitchen omeprazole (PRILOSEC) 40 MG capsule Take 1 capsule (40 mg total) by mouth daily. You must make a follow up appointment for any further refills. [phone]  . oxybutynin (DITROPAN-XL) 10 MG 24 hr tablet Take 10 mg by mouth daily.  . polyethylene glycol (MIRALAX / GLYCOLAX) packet Take 17 g by mouth 2 (two) times daily.  Marland Kitchen  rosuvastatin (CRESTOR) 20 MG tablet Take 1 tablet (20 mg total) by mouth daily.  . nitroGLYCERIN (NITROSTAT) 0.4 MG SL tablet Place 1 tablet (0.4 mg total) under the tongue every 5 (five) minutes as needed. For chest pain (Patient not taking: Reported on 02/23/2019)  . [DISCONTINUED] NON FORMULARY Apply 1 application topically daily. [facility]  Scar Cream -  Verapamil 10%, Pentoxifylline 5% Apply 1-2 grams to affected area daily Qty. 120 gm 3 refills   No facility-administered encounter medications on file  as of 02/23/2019.     Allergies as of 02/23/2019 - Review Complete 02/23/2019  Allergen Reaction Noted  . Latex  05/04/2010  . Lisinopril Cough 06/19/2010  . Tramadol hcl Nausea And Vomiting 05/04/2010    Past Medical History:  Diagnosis Date  . Angina pectoris ([place]) 2005   with stent placement Anterior decscending  . Anxiety   . Arthritis   . Bone spur    Right Hip  . CAD (coronary artery disease) 2005   s/p PCI LAD and cutting balloon angioplasty  with stent reexpansion 04/2008  . Colon polyps   . Degenerative disc disease   . Depression   . GERD (gastroesophageal reflux disease)   . Hypercholesterolemia   . Hyperlipemia   . Hypertension   . Hypothyroidism   . MI (myocardial infarction) ([place]) 2005   angio with stent  . Positive PPD    neg. CXR, treated with INH for 1 yr.  about 37  . UTI (urinary tract infection) 05/31/15    Past Surgical History:  Procedure Laterality Date  . BUNIONECTOMY    . COLONOSCOPY    . CORONARY ANGIOPLASTY  2009  . CORONARY ANGIOPLASTY WITH STENT PLACEMENT  2005  . HEMORRHOID SURGERY  06/2015   Removal   . LAPAROSCOPY     for endometriosis  . LEFT HEART CATHETERIZATION WITH CORONARY ANGIOGRAM Bilateral 06/27/2011   Procedure: LEFT HEART CATHETERIZATION WITH CORONARY ANGIOGRAM;  Surgeon: Burnell Blanks, MD;  Location: [facility] CATH LAB;  Service: Cardiovascular;  Laterality: Bilateral;  . LUMBAR EPIDURAL INJECTION     many  . NERVE ROOT BLOCK     epidural steroids; multiple  . POLYPECTOMY    . ruptured disc  2007   with epidural   . TONGUE BIOPSY     keratin accumulation - Benign   . TONSILLECTOMY    . TOTAL HIP ARTHROPLASTY Right 01/21/2018   Procedure: RIGHT TOTAL HIP ARTHROPLASTY ANTERIOR APPROACH;  Surgeon: Paralee Cancel, MD;  Location: [facility] ORS;  Service: Orthopedics;  Laterality: Right;  44mins  . TUBAL LIGATION      Family History  Problem Relation Age of Onset  . Colon cancer Mother 50  . Stroke Mother   . Hypertension  Mother   . Diabetes Mother   . Rheum arthritis Mother   . Atrial fibrillation Mother   . Heart attack Father        x2  . Diabetes Father   . Kidney disease Father   . Heart disease Father   . Stroke Maternal Grandmother   . Colon cancer Paternal Grandmother   . Diabetes Paternal Grandmother   . Esophageal cancer Cousin 61    Social History   Socioeconomic History  . Marital status: Divorced    Spouse name: Not on file  . Number of children: 3  . Years of education: Not on file  . Highest education level: Not on file  Occupational History  . Occupation: Programmer, multimedia:  [employer]  . Financial resource strain: Not on file  . Food insecurity    Worry: Not on file    Inability: Not on file  . Transportation needs    Medical: Not on file    Non-medical: Not on file  Tobacco Use  . Smoking status: Never Smoker  . Smokeless tobacco: Never Used  Substance and Sexual Activity  . Alcohol use: Yes    Alcohol/week: 1.0 standard drinks    Types: 1 Glasses of wine per week    Comment: occ wine  . Drug use: No  . Sexual activity: Not Currently    Birth control/protection: Post-menopausal    Comment: BTL 07/03/89  Lifestyle  . Physical activity    Days per week: Not on file    Minutes per session: Not on file  . Stress: Not on file  Relationships  . Social Herbalist on phone: Not on file    Gets together: Not on file    Attends religious service: Not on file    Active member of club or organization: Not on file    Attends meetings of clubs or organizations: Not on file    Relationship status: Not on file  . Intimate partner violence    Fear of current or ex partner: Not on file    Emotionally abused: Not on file    Physically abused: Not on file    Forced sexual activity: Not on file  Other Topics Concern  . Not on file  Social History Narrative  . Not on file      Review of systems: Review of Systems  Constitutional: Negative for  fever and chills.  Positive for lack of energy HENT: Positive for sinus problem Eyes: Negative for blurred vision.  Respiratory: Negative for cough, shortness of breath and wheezing.   Cardiovascular: Negative for chest pain and palpitations.  Gastrointestinal: as per HPI Genitourinary: Negative for dysuria, urgency, frequency and hematuria.  Musculoskeletal: Positive for myalgias, back pain and joint pain.  Skin: Negative for itching and rash.  Neurological: Negative for dizziness, tremors, focal weakness, seizures and loss of consciousness.  Endo/Heme/Allergies: Positive for easy bruising.  Psychiatric/Behavioral: Negative for suicidal ideas and hallucinations.  Positive for depression and anxiety All other systems reviewed and are negative.   Physical Exam: Vitals:   02/23/19 1055  BP: 128/90  Pulse: 96  Temp: 98.3 F (36.8 C)   Body mass index is 28.7 kg/m. Gen:      No acute distress HEENT:  EOMI, sclera anicteric Neck:     No masses; no thyromegaly Lungs:    Clear to auscultation bilaterally; normal respiratory effort CV:         Regular rate and rhythm; no murmurs Abd:      + bowel sounds; soft, non-tender; no palpable masses, no distension Ext:    No edema; adequate peripheral perfusion Skin:      Warm and dry; no rash Neuro: alert and oriented x 3 Psych: normal mood and affect  Data Reviewed:  Reviewed labs, radiology imaging, old records and pertinent past GI work up   Assessment and Plan/Recommendations:  63 year old female with family history of colon cancer, chronic constipation and GERD  GERD: Start Protonix 40 mg twice daily Discussed antireflux measures and lifestyle modifications  Constipation: Start Benefiber 1 tablespoon 3 times daily with meals Increase dietary fiber and water intake MiraLAX 1 capful daily, titrate as needed to have 1-2 soft bowel  movements daily  Family history of colon cancer, due for screening colonoscopy March 2022   Advised patient to continue with counseling to cope with  grief at untimely loss of her adult child  Return in 2 years or sooner if needed  25 minutes was spent face-to-face with the patient. Greater than 50% of the time used for counseling as well as treatment plan and follow-up. She had multiple questions which were answered to her satisfaction  K. Denzil Magnuson , MD    CC: Shon Baton, MD

## 2019-02-23 NOTE — Patient Instructions (Signed)
If you are age 64 or older, your body mass index should be between 23-30. Your Body mass index is 28.7 kg/m. If this is out of the aforementioned range listed, please consider follow up with your Primary Care Provider.  If you are age 65 or younger, your body mass index should be between 19-25. Your Body mass index is 28.7 kg/m. If this is out of the aformentioned range listed, please consider follow up with your Primary Care Provider.   We have sent the following medications to your pharmacy for you to pick up at your convenience:  1. Stop Omeprazole. Start Protonix 40 mg twice daily.   2. Start Benifiber 1 tablespoon three times daily.   3. Miralax 1 cap full daily as needed.   Follow up in 2 years   It was a pleasure to see you today!  Dr. Silverio Decamp

## 2019-02-24 ENCOUNTER — Telehealth: Payer: Self-pay | Admitting: Gastroenterology

## 2019-02-24 MED ORDER — PANTOPRAZOLE SODIUM 40 MG PO TBEC: 40.0000 mg | DELAYED_RELEASE_TABLET | Freq: Two times a day (BID) | ORAL | 3 refills | Status: DC

## 2019-02-24 NOTE — Telephone Encounter (Signed)
Informed patient that it was sent to the pharmacy for once daily dosing in error but she is supposed to take it twice daily. Informed patient to take the new prescription twice daily and when she runs out to pick up the new prescription. Patient verbalized understanding.

## 2019-02-24 NOTE — Telephone Encounter (Signed)
Pt is calling to verify medication. She got prescribed pantoprazole and on the after visit summary she says it says to take twice daily but the pharmacy filled the prescription of 1 tablet once daily. She wants to know which is right.

## 2019-03-02 DIAGNOSIS — Z1231 Encounter for screening mammogram for malignant neoplasm of breast: Secondary | ICD-10-CM | POA: Diagnosis not present

## 2019-03-04 ENCOUNTER — Encounter: Payer: Self-pay | Admitting: Gastroenterology

## 2019-03-10 ENCOUNTER — Other Ambulatory Visit: Payer: Self-pay | Admitting: Cardiovascular Disease

## 2019-03-11 ENCOUNTER — Telehealth: Payer: Self-pay | Admitting: Obstetrics and Gynecology

## 2019-03-11 ENCOUNTER — Ambulatory Visit: Payer: BLUE CROSS/BLUE SHIELD | Admitting: Obstetrics and Gynecology

## 2019-03-11 NOTE — Telephone Encounter (Signed)
Patient arrived for annual exam with out of network insurance. Unprepared to self pay for visit. Will call back to reschedule once she changes her insurance.

## 2019-03-23 DIAGNOSIS — F4310 Post-traumatic stress disorder, unspecified: Secondary | ICD-10-CM | POA: Diagnosis not present

## 2019-07-08 DIAGNOSIS — F4310 Post-traumatic stress disorder, unspecified: Secondary | ICD-10-CM | POA: Diagnosis not present

## 2019-07-14 DIAGNOSIS — F329 Major depressive disorder, single episode, unspecified: Secondary | ICD-10-CM | POA: Diagnosis not present

## 2019-07-14 DIAGNOSIS — I10 Essential (primary) hypertension: Secondary | ICD-10-CM | POA: Diagnosis not present

## 2019-07-14 DIAGNOSIS — E039 Hypothyroidism, unspecified: Secondary | ICD-10-CM | POA: Diagnosis not present

## 2019-07-14 DIAGNOSIS — D692 Other nonthrombocytopenic purpura: Secondary | ICD-10-CM | POA: Diagnosis not present

## 2019-07-14 DIAGNOSIS — R32 Unspecified urinary incontinence: Secondary | ICD-10-CM | POA: Diagnosis not present

## 2019-07-14 DIAGNOSIS — K5900 Constipation, unspecified: Secondary | ICD-10-CM | POA: Diagnosis not present

## 2019-07-14 DIAGNOSIS — E669 Obesity, unspecified: Secondary | ICD-10-CM | POA: Diagnosis not present

## 2019-07-14 DIAGNOSIS — M1990 Unspecified osteoarthritis, unspecified site: Secondary | ICD-10-CM | POA: Diagnosis not present

## 2019-07-21 ENCOUNTER — Encounter: Payer: Self-pay | Admitting: Certified Nurse Midwife

## 2019-07-21 DIAGNOSIS — I10 Essential (primary) hypertension: Secondary | ICD-10-CM | POA: Diagnosis not present

## 2019-07-21 DIAGNOSIS — E038 Other specified hypothyroidism: Secondary | ICD-10-CM | POA: Diagnosis not present

## 2019-07-22 DIAGNOSIS — F4310 Post-traumatic stress disorder, unspecified: Secondary | ICD-10-CM | POA: Diagnosis not present

## 2019-07-30 DIAGNOSIS — M1831 Unilateral post-traumatic osteoarthritis of first carpometacarpal joint, right hand: Secondary | ICD-10-CM | POA: Diagnosis not present

## 2019-07-30 DIAGNOSIS — M720 Palmar fascial fibromatosis [Dupuytren]: Secondary | ICD-10-CM | POA: Diagnosis not present

## 2019-08-03 ENCOUNTER — Telehealth: Payer: Self-pay | Admitting: *Deleted

## 2019-08-03 ENCOUNTER — Telehealth: Payer: Self-pay | Admitting: Gastroenterology

## 2019-08-03 MED ORDER — NITROGLYCERIN 0.4 MG SL SUBL: 0.4000 mg | SUBLINGUAL_TABLET | SUBLINGUAL | 3 refills | Status: DC | PRN

## 2019-08-03 NOTE — Telephone Encounter (Signed)
   Primary Cardiologist: Mertie Moores, MD  Chart reviewed as part of pre-operative protocol coverage. Patient was contacted 08/03/2019 in reference to pre-operative risk assessment for pending surgery as outlined below.  Sarah Ballard was last seen on 12/03/2018 by Dr. Acie Fredrickson. Since that day, Sarah Ballard has done fine from a cardiac standpoint. Still dealing with a lot of grief after the passing of her son and father on the same day last year. She has chronic DOE which she attributes to her weight and is unchanged in recent weeks/months. No complaints of exertional chest pain. She can complete 4 METs without anginal complaints.   Therefore, based on ACC/AHA guidelines, the patient would be at acceptable risk for the planned procedure without further cardiovascular testing.   Per prior recommendations by Dr. Acie Fredrickson and given lack of interval change in cardiac history, patient can hold plavix 5-7 days prior to her upcoming surgery and should restart plavix when cleared to do so by her surgeon.   I will route this recommendation to the requesting party via Epic fax function and remove from pre-op pool.  Please call with questions.  Abigail Butts, PA-C 08/03/2019, 3:34 PM

## 2019-08-03 NOTE — Telephone Encounter (Signed)
She will be on Plavix so no need to start ASA also ( unless she has another stent placed )   we can discuss startign Vasepa at our next visit.    For now,  she would benefit from continued diet , weight loss and starting an exercise program when she is able     Mertie Moores, MD  08/03/2019 4:26 PM    [address],  [place], [place]  [address] Phone: [phone]; Fax: [phone]

## 2019-08-03 NOTE — Telephone Encounter (Signed)
   [facility] Pre-operative Risk Assessment    Request for surgical clearance:  1. What type of surgery is being performed? RIGHT L4 NERVE ROOT BLOCK/EPIDURAL STEROID INJECTION  2. When is this surgery scheduled? 08/11/19   3. What type of clearance is required (medical clearance vs. Pharmacy clearance to hold med vs. Both)? MEDICAL  4. Are there any medications that need to be held prior to surgery and how long? PLAVIX    5. Practice name and name of physician performing surgery? [facility]; DR. Broadus John STERN   6. What is your office phone number [phone]    7.   What is your office fax number [phone]  8.   Anesthesia type (None, local, MAC, general) ? IV SEDATION   Julaine Hua 08/03/2019, 3:18 PM  _________________________________________________________________   (provider comments below)

## 2019-08-03 NOTE — Telephone Encounter (Signed)
Please advise 

## 2019-08-04 MED ORDER — DEXILANT 60 MG PO CPDR: 60.0000 mg | DELAYED_RELEASE_CAPSULE | Freq: Every day | ORAL | 3 refills | Status: DC

## 2019-08-04 NOTE — Telephone Encounter (Signed)
Please send Rx for Dexilant 60mg  daily X30 with 3 refills. Schedule follow up office visit, next available to discuss further options for management of GERD.    She has tried Protonix, Prilosec and Nexium in the past with persistent GERD symptoms.   Thanks

## 2019-08-04 NOTE — Telephone Encounter (Signed)
Informed patient of switch to Dexilant and scheduled follow up appointment. Patient voiced understanding.

## 2019-08-05 DIAGNOSIS — F4310 Post-traumatic stress disorder, unspecified: Secondary | ICD-10-CM | POA: Diagnosis not present

## 2019-08-11 DIAGNOSIS — M47816 Spondylosis without myelopathy or radiculopathy, lumbar region: Secondary | ICD-10-CM | POA: Diagnosis not present

## 2019-08-11 DIAGNOSIS — M5416 Radiculopathy, lumbar region: Secondary | ICD-10-CM | POA: Diagnosis not present

## 2019-08-11 DIAGNOSIS — M48062 Spinal stenosis, lumbar region with neurogenic claudication: Secondary | ICD-10-CM | POA: Diagnosis not present

## 2019-08-11 DIAGNOSIS — M48061 Spinal stenosis, lumbar region without neurogenic claudication: Secondary | ICD-10-CM | POA: Diagnosis not present

## 2019-08-11 DIAGNOSIS — M5136 Other intervertebral disc degeneration, lumbar region: Secondary | ICD-10-CM | POA: Diagnosis not present

## 2019-08-19 DIAGNOSIS — F4310 Post-traumatic stress disorder, unspecified: Secondary | ICD-10-CM | POA: Diagnosis not present

## 2019-08-21 ENCOUNTER — Other Ambulatory Visit: Payer: Self-pay

## 2019-08-21 ENCOUNTER — Encounter: Payer: Self-pay | Admitting: Cardiovascular Disease

## 2019-08-21 ENCOUNTER — Ambulatory Visit: Payer: PPO | Admitting: Cardiovascular Disease

## 2019-08-21 VITALS — BP 118/78 | HR 66 | Ht 65.5 in | Wt 172.5 lb

## 2019-08-21 DIAGNOSIS — E782 Mixed hyperlipidemia: Secondary | ICD-10-CM

## 2019-08-21 DIAGNOSIS — I10 Essential (primary) hypertension: Secondary | ICD-10-CM

## 2019-08-21 DIAGNOSIS — I2510 Atherosclerotic heart disease of native coronary artery without angina pectoris: Secondary | ICD-10-CM

## 2019-08-21 NOTE — Patient Instructions (Signed)
Medication Instructions:  Your physician recommends that you continue on your current medications as directed. Please refer to the Current Medication list given to you today.  *If you need a refill on your cardiac medications before your next appointment, please call your pharmacy*   Lab Work: TODAY - cholesterol, liver panel, kidney function/electrolytes If you have labs (blood work) drawn today and your tests are completely normal, you will receive your results only by: Marland Kitchen MyChart Message (if you have MyChart) OR . A paper copy in the mail If you have any lab test that is abnormal or we need to change your treatment, we will call you to review the results.   Testing/Procedures: None Ordered   Follow-Up: At [facility], you and your health needs are our priority.  As part of our continuing mission to provide you with exceptional heart care, we have created designated Provider Care Teams.  These Care Teams include your primary Cardiologist (physician) and Advanced Practice Providers (APPs -  Physician Assistants and Nurse Practitioners) who all work together to provide you with the care you need, when you need it.  We recommend signing up for the patient portal called "MyChart".  Sign up information is provided on this After Visit Summary.  MyChart is used to connect with patients for Virtual Visits (Telemedicine).  Patients are able to view lab/test results, encounter notes, upcoming appointments, etc.  Non-urgent messages can be sent to your provider as well.   To learn more about what you can do with MyChart, go to [email or web address].    Your next appointment:   6 month(s)  The format for your next appointment:   Either In Person or Virtual  Provider:   You may see Mertie Moores, MD or one of the following Advanced Practice Providers on your designated Care Team:    Richardson Dopp, PA-C  [place], [place]  Daune Perch, [place]

## 2019-08-21 NOTE — Progress Notes (Signed)
Sarah Ballard Date of Birth  [birth year] [facility] [address]    [place], [place]  [address] [phone]  Fax  [phone]  Problem List 1. Coronary artery disease-  2. anxiety 3. Hyperlipidemia 4. Hypertension 5. Hypothyroidism 6. Chronic back pain     The 64 yo  female with a history of coronary artery disease. She's status post PTCA and stenting of her left anterior descending artery. She also had Cut[place] Balloon procedure and reexpansion of that stent in January 2010.  She was admitted recently to the hospital for chest pain an dizziness and cath revealed no significant irregularities.  He is walking twice a week. She's not having episodes of angina with walking. 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  October 27, 2012:  Sarah Ballard is doing well from a cardiac standpoint.   She is having some back pain - needs to have a back injection but does not want to stop the Plavix.    October 27, 2013:  Sarah Ballard is doing well.  She denies any chest pain.  Bumping into lots of things and has bruising on her legs.   Her potassium was low at her last check.    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  Asked about stopping the plavix   Jan. 24, 2017:  Sarah Ballard is doing ok Has been losing weight.   Lots of stress with taking care of her father and her divorce and her son .  No CP .     November 17, 2015:  Doing ok.  Sleepy .  Had to work late last night . No CP,  Breathing is good Lots of anxiety with work stress.   July 12, 2016:  Doing ok from a cardiac standpoint Has gained some weight .  Rare episodes of CP - she thinks its due to stress or indigestion,   its not related to exertion  requests a refill of her NTG  Is trying to move  Is having lots of artheritis   Pain  Is falling lots at work   Nov. 14,  2018  Has  Moved since I last saw her. Is having some chest pain  Thinks its GERD or anxeity.  Lots of stress  Lots of stress related to the move.    The CP are atypical , do not feel like her previouis angina Takes NTG - does not really relieve the CP Finds [employer] work   Is planning on having bunion surgery at the end of this year Will be at low risk Will need to hold Plavix for 5 days prior to surgery .    Father has gained some fluid .   Feb. 27, 2019 Doing well Is very appreciative that I sent her to see Ezekiel Slocumb for her anxiety .  Father recently had the flu  Sarah Ballard has had a cough / cold.   Developed quickly. Has been seen by Dr. 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    December 26, 2017:  Sarah Ballard is seen today for follow-up visit. MRI of her hip shows that she needs to have a hip replacement Sees Dr. Alvan Dame . No angina    April, 23, 2021:  Sarah Ballard is doing well  Has some indigestion  Not relieved with  protonix.  , suggested pepcid complete in addition to her Protonix   No angina  Needs to exercise more.  Has gained some weight Has been getting short of breath while walking ,  I suspect most of this dyspnea is due to weight gain and general deconditioning .  No angina similar to her presenting symptoms of angina pain from years ago .    Current Outpatient Medications on File Prior to Visit  Medication Sig Dispense Refill  . ALPRAZolam (XANAX) 0.5 MG tablet Take 0.5 mg by mouth 2 (two) times daily.     Marland Kitchen amitriptyline (ELAVIL) 10 MG tablet Take 20 mg by mouth at bedtime.    . betamethasone valerate ointment (VALISONE) 0.1 % Apply a pea sized amount topically BID for 1-2 weeks as needed. Not for daily long term use. 15 g 0  . busPIRone (BUSPAR) 5 MG tablet Take 5 mg by mouth 2 (two) times daily.     . calcium carbonate (TUMS - DOSED IN MG ELEMENTAL CALCIUM) 500 MG chewable tablet Chew 1-2 tablets by mouth 3 (three) times daily as needed for  indigestion or heartburn.    . Calcium Carbonate-Vitamin D (CALTRATE 600+D) 600-400 MG-UNIT per tablet Take 1 tablet daily by mouth.     . clopidogrel (PLAVIX) 75 MG tablet Take 1 tablet (75 mg total) by mouth daily. 30 tablet 9  . cyclobenzaprine (FLEXERIL) 10 MG tablet Take 1 tablet (10 mg total) by mouth 3 (three) times daily as needed for muscle spasms. 30 tablet 0  . dexlansoprazole (DEXILANT) 60 MG capsule Take 1 capsule (60 mg total) by mouth daily. 30 capsule 3  . docusate sodium (COLACE) 100 MG capsule Take 1 capsule (100 mg total) by mouth 2 (two) times daily. 10 capsule 0  . DULoxetine (CYMBALTA) 30 MG capsule Take 30 mg by mouth 3 (three) times daily.    Marland Kitchen estradiol (ESTRACE) 0.1 MG/GM vaginal cream 1 gram vaginally twice weekly 42.5 g 1  . fluticasone (FLONASE) 50 MCG/ACT nasal spray Place 2 sprays into both nostrils daily.     Marland Kitchen HYDROcodone-acetaminophen (NORCO) 7.5-325 MG tablet Take 1-2 tablets by mouth every 4 (four) hours as needed for moderate pain. 60 tablet 0  . irbesartan (AVAPRO) 150 MG tablet TAKE ONE TABLET BY MOUTH DAILY**PLEASE KEEP UPCOMING APPOINTMENT WITH DR. Acie Fredrickson IN AUGUST FOR FUTURE REFILLS** 90 tablet 3  . levothyroxine (SYNTHROID) 112 MCG tablet Take 112 mcg by mouth daily before breakfast.    . Magnesium 400 MG CAPS Take 400 mg by mouth daily.    . metoprolol tartrate (LOPRESSOR) 25 MG tablet TAKE 1/2 TABLET (12.5MG ) TWO TIMES A DAY WITH FOOD 90 tablet 3  . nitroGLYCERIN (NITROSTAT) 0.4 MG SL tablet Place 1 tablet (0.4 mg total) under the tongue every 5 (five) minutes as needed. For chest pain 25 tablet 3  . omega-3 acid ethyl esters (LOVAZA) 1 G capsule Take 2 g by mouth 2 (two) times daily.      Marland Kitchen oxybutynin (DITROPAN-XL) 10 MG 24 hr tablet Take 10 mg by mouth daily.  11  . polyethylene glycol (MIRALAX / GLYCOLAX) packet Take 17 g by mouth 2 (two) times daily. 14 each 0  . rosuvastatin (CRESTOR) 20 MG tablet Take 1 tablet (20 mg total) by mouth daily. 90  tablet 3  . Wheat Dextrin (BENEFIBER) POWD Take 1 Scoop by mouth 3 (three) times daily. As needed     No current facility-administered medications on file prior to visit.  Allergies  Allergen Reactions  . Latex     itching  . Lisinopril Cough  . Tramadol Hcl Nausea And Vomiting    Past Medical History:  Diagnosis Date  . Angina pectoris ([place]) 2005   with stent placement Anterior decscending  . Anxiety   . Arthritis   . Bone spur    Right Hip  . CAD (coronary artery disease) 2005   s/p PCI LAD and cutting balloon angioplasty  with stent reexpansion 04/2008  . Colon polyps   . Degenerative disc disease   . Depression   . GERD (gastroesophageal reflux disease)   . Hypercholesterolemia   . Hyperlipemia   . Hypertension   . Hypothyroidism   . MI (myocardial infarction) ([place]) 2005   angio with stent  . Positive PPD    neg. CXR, treated with INH for 1 yr.  about 41  . UTI (urinary tract infection) 05/31/15    Past Surgical History:  Procedure Laterality Date  . BUNIONECTOMY    . COLONOSCOPY    . CORONARY ANGIOPLASTY  2009  . CORONARY ANGIOPLASTY WITH STENT PLACEMENT  2005  . HEMORRHOID SURGERY  06/2015   Removal   . LAPAROSCOPY     for endometriosis  . LEFT HEART CATHETERIZATION WITH CORONARY ANGIOGRAM Bilateral 06/27/2011   Procedure: LEFT HEART CATHETERIZATION WITH CORONARY ANGIOGRAM;  Surgeon: Burnell Blanks, MD;  Location: [facility] CATH LAB;  Service: Cardiovascular;  Laterality: Bilateral;  . LUMBAR EPIDURAL INJECTION     many  . NERVE ROOT BLOCK     epidural steroids; multiple  . POLYPECTOMY    . ruptured disc  2007   with epidural   . TONGUE BIOPSY     keratin accumulation - Benign   . TONSILLECTOMY    . TOTAL HIP ARTHROPLASTY Right 01/21/2018   Procedure: RIGHT TOTAL HIP ARTHROPLASTY ANTERIOR APPROACH;  Surgeon: Paralee Cancel, MD;  Location: [facility] ORS;  Service: Orthopedics;  Laterality: Right;  24mins  . TUBAL LIGATION      Social History   Tobacco  Use  Smoking Status Never Smoker  Smokeless Tobacco Never Used    Social History   Substance and Sexual Activity  Alcohol Use Yes  . Alcohol/week: 1.0 standard drinks  . Types: 1 Glasses of wine per week   Comment: occ wine    Family History  Problem Relation Age of Onset  . Colon cancer Mother 8  . Stroke Mother   . Hypertension Mother   . Diabetes Mother   . Rheum arthritis Mother   . Atrial fibrillation Mother   . Heart attack Father        x2  . Diabetes Father   . Kidney disease Father   . Heart disease Father   . Stroke Maternal Grandmother   . Colon cancer Paternal Grandmother   . Diabetes Paternal Grandmother   . Esophageal cancer Cousin 107    Reviw of Systems:  Reviewed in the HPI.  All other systems are negative.   Physical Exam: Blood pressure 118/78, pulse 66, height 5' 5.5" (1.664 m), weight 172 lb 8 oz (78.2 kg), last menstrual period 06/29/2006, SpO2 97 %.  GEN:   Moderately obese, middle age female  HEENT: Normal NECK: No JVD; No carotid bruits LYMPHATICS: No lymphadenopathy CARDIAC: RRR , no murmurs, rubs, gallops RESPIRATORY:  Clear to auscultation without rales, wheezing or rhonchi  ABDOMEN: Soft, non-tender, non-distended MUSCULOSKELETAL:  No edema; No deformity  SKIN: Warm and dry NEUROLOGIC:  Alert and  oriented x 3   ECG : August 21, 2019: Normal sinus rhythm at 67.  No ST or T wave changes.  Assessment / Plan:   1. Coronary artery disease-    No angina.  Has DOE.  I think its due to her weight gain and deconditioning .    No angina similar to her presenting symptoms  She will start a walking program   2. Anxiety -    Seeing Ezekiel Slocumb   she has lots of anxiety issues.  3. Hyperlipidemia -      check labs today.  4. Hypertension  -    PB is well controlled   5. Hypothyroidism  6. Chronic back pain  7. Weight gain :   Advised more exercise and fewer calories.      .    Body mass index is 28.27 kg/m.   Mertie Moores, MD  08/21/2019 2:47 PM    [place],  [place], [place]  [address] Pager [phone] Phone: [phone]; Fax: [phone]

## 2019-08-22 LAB — HEPATIC FUNCTION PANEL
ALT: 19 IU/L (ref 0–32)
AST: 20 IU/L (ref 0–40)
Albumin: 4.4 g/dL (ref 3.8–4.8)
Alkaline Phosphatase: 93 IU/L (ref 39–117)
Bilirubin Total: 0.4 mg/dL (ref 0.0–1.2)
Bilirubin, Direct: 0.14 mg/dL (ref 0.00–0.40)
Total Protein: 6.3 g/dL (ref 6.0–8.5)

## 2019-08-22 LAB — LIPID PANEL
Chol/HDL Ratio: 2.2 ratio (ref 0.0–4.4)
Cholesterol, Total: 146 mg/dL (ref 100–199)
HDL: 65 mg/dL (ref >39)
LDL Chol Calc (NIH): 69 mg/dL (ref 0–99)
Triglycerides: 58 mg/dL (ref 0–149)
VLDL Cholesterol Cal: 12 mg/dL (ref 5–40)

## 2019-08-22 LAB — BASIC METABOLIC PANEL
BUN/Creatinine Ratio: 23 (ref 12–28)
BUN: 16 mg/dL (ref 8–27)
CO2: 23 mmol/L (ref 20–29)
Calcium: 8.9 mg/dL (ref 8.7–10.3)
Chloride: 100 mmol/L (ref 96–106)
Creatinine, Ser: 0.7 mg/dL (ref 0.57–1.00)
GFR calc Af Amer: 105 mL/min/{1.73_m2} (ref >59)
GFR calc non Af Amer: 91 mL/min/{1.73_m2} (ref >59)
Glucose: 92 mg/dL (ref 65–99)
Potassium: 4.2 mmol/L (ref 3.5–5.2)
Sodium: 139 mmol/L (ref 134–144)

## 2019-09-02 DIAGNOSIS — F4310 Post-traumatic stress disorder, unspecified: Secondary | ICD-10-CM | POA: Diagnosis not present

## 2019-09-11 ENCOUNTER — Encounter: Payer: Self-pay | Admitting: Gastroenterology

## 2019-09-11 ENCOUNTER — Ambulatory Visit: Payer: PPO | Admitting: Gastroenterology

## 2019-09-11 DIAGNOSIS — R109 Unspecified abdominal pain: Secondary | ICD-10-CM

## 2019-09-11 DIAGNOSIS — K219 Gastro-esophageal reflux disease without esophagitis: Secondary | ICD-10-CM | POA: Diagnosis not present

## 2019-09-11 MED ORDER — DEXILANT 30 MG PO CPDR: 60.0000 mg | DELAYED_RELEASE_CAPSULE | Freq: Every day | ORAL | 11 refills | Status: DC

## 2019-09-11 NOTE — Patient Instructions (Signed)
If you are age 65 or older, your body mass index should be between 23-30. Your Body mass index is 28.35 kg/m. If this is out of the aforementioned range listed, please consider follow up with your Primary Care Provider.  If you are age 61 or younger, your body mass index should be between 19-25. Your Body mass index is 28.35 kg/m. If this is out of the aformentioned range listed, please consider follow up with your Primary Care Provider.   You have been scheduled for an abdominal ultrasound at [facility] (1st floor of hospital) on 09-18-19 at 8:00am. Please arrive 15 minutes prior to your appointment for registration. Make certain not to have anything to eat or drink after midnight the night prior to your appointment. Should you need to reschedule your appointment, please contact radiology at [phone]. This test typically takes about 30 minutes to perform.  CHANGE: Dexilant to 30mg  2 capsules daily  Please purchase the following medications over the counter and take as directed:  START: FD gard 1 capsule three times daily as needed.  Due to recent changes in healthcare laws, you may see the results of your imaging and laboratory studies on MyChart before your provider has had a chance to review them.  We understand that in some cases there may be results that are confusing or concerning to you. Not all laboratory results come back in the same time frame and the provider may be waiting for multiple results in order to interpret others.  Please give [place] 48 hours in order for your provider to thoroughly review all the results before contacting the office for clarification of your results.   I appreciate the opportunity to care for you. Thank you for choosing me and [facility],  Dr. Harl Bowie

## 2019-09-11 NOTE — Progress Notes (Signed)
Sarah Ballard    AO:[id number]    1955/05/12  Primary Care Physician:Russo, Jenny Reichmann, MD  Referring Physician: Shon Baton, MD [address] [place],  [place] [address]   Chief complaint:  GERD  HPI: 64 year old very pleasant female with history of chronic GERD and constipation for follow-up  Family history or colon cancer Colonoscopy: 07/08/2015: Internal hemorrhoids otherwise unremarkable exam  Colonoscopy was in 2012 by Dr Olevia Perches 1 sessile polyp removed was polypoid normal mucosa with no adenoma. Prior to that she had a normal colonoscopy in 2006. Her mother had colon cancer at age 90 and paternal grandmother had colon cancer in her 55s.  GERD: She is taking Dexilant.  Denies any dysphagia, odynophagia, vomiting, melena or blood per rectum He has epigastric abdominal discomfort, worse after certain meals. She feels reflux symptoms are worse in the past few months, is is grieving from recent loss of her father and son.    She does not have much appetite.  She tries to eat something quickly and does not think she needs the best food choices.  She has excessive belching and gas with bloating Constipation improved with stool softener, MiraLAX and fiber.  Outpatient Encounter Medications as of 09/11/2019  Medication Sig  . ALPRAZolam (XANAX) 0.5 MG tablet Take 0.5 mg by mouth 2 (two) times daily.   Marland Kitchen amitriptyline (ELAVIL) 10 MG tablet Take 20 mg by mouth at bedtime.  . betamethasone valerate ointment (VALISONE) 0.1 % Apply a pea sized amount topically BID for 1-2 weeks as needed. Not for daily long term use.  . busPIRone (BUSPAR) 5 MG tablet Take 5 mg by mouth 2 (two) times daily.   . calcium carbonate (TUMS - DOSED IN MG ELEMENTAL CALCIUM) 500 MG chewable tablet Chew 1-2 tablets by mouth 3 (three) times daily as needed for indigestion or heartburn.  . Calcium Carbonate-Vitamin D (CALTRATE 600+D) 600-400 MG-UNIT per tablet Take 1 tablet daily by mouth.   . clopidogrel  (PLAVIX) 75 MG tablet Take 1 tablet (75 mg total) by mouth daily.  . cyclobenzaprine (FLEXERIL) 10 MG tablet Take 1 tablet (10 mg total) by mouth 3 (three) times daily as needed for muscle spasms.  Marland Kitchen dexlansoprazole (DEXILANT) 60 MG capsule Take 1 capsule (60 mg total) by mouth daily.  Marland Kitchen docusate sodium (COLACE) 100 MG capsule Take 1 capsule (100 mg total) by mouth 2 (two) times daily.  . DULoxetine (CYMBALTA) 30 MG capsule Take 30 mg by mouth 3 (three) times daily.  Marland Kitchen estradiol (ESTRACE) 0.1 MG/GM vaginal cream 1 gram vaginally twice weekly  . fluticasone (FLONASE) 50 MCG/ACT nasal spray Place 2 sprays into both nostrils daily.   Marland Kitchen HYDROcodone-acetaminophen (NORCO) 7.5-325 MG tablet Take 1-2 tablets by mouth every 4 (four) hours as needed for moderate pain.  Marland Kitchen irbesartan (AVAPRO) 150 MG tablet TAKE ONE TABLET BY MOUTH DAILY**PLEASE KEEP UPCOMING APPOINTMENT WITH DR. Acie Fredrickson IN AUGUST FOR FUTURE REFILLS**  . levothyroxine (SYNTHROID) 112 MCG tablet Take 112 mcg by mouth daily before breakfast.  . Magnesium 400 MG CAPS Take 400 mg by mouth daily.  . metoprolol tartrate (LOPRESSOR) 25 MG tablet TAKE 1/2 TABLET (12.5MG ) TWO TIMES A DAY WITH FOOD  . nitroGLYCERIN (NITROSTAT) 0.4 MG SL tablet Place 1 tablet (0.4 mg total) under the tongue every 5 (five) minutes as needed. For chest pain  . omega-3 acid ethyl esters (LOVAZA) 1 G capsule Take 2 g  by mouth 2 (two) times daily.    Marland Kitchen oxybutynin (DITROPAN-XL) 10 MG 24 hr tablet Take 10 mg by mouth daily.  . polyethylene glycol (MIRALAX / GLYCOLAX) packet Take 17 g by mouth 2 (two) times daily.  . rosuvastatin (CRESTOR) 20 MG tablet Take 1 tablet (20 mg total) by mouth daily.  . Wheat Dextrin (BENEFIBER) POWD Take 1 Scoop by mouth 3 (three) times daily. As needed   No facility-administered encounter medications on file as of 09/11/2019.    Allergies as of 09/11/2019 - Review Complete 09/11/2019  Allergen Reaction Noted  . Latex  05/04/2010  . Lisinopril  Cough 06/19/2010  . Tramadol hcl Nausea And Vomiting 05/04/2010    Past Medical History:  Diagnosis Date  . Angina pectoris ([place]) 2005   with stent placement Anterior decscending  . Anxiety   . Arthritis   . Bone spur    Right Hip  . CAD (coronary artery disease) 2005   s/p PCI LAD and cutting balloon angioplasty  with stent reexpansion 04/2008  . Colon polyps   . Degenerative disc disease   . Depression   . GERD (gastroesophageal reflux disease)   . Hypercholesterolemia   . Hyperlipemia   . Hypertension   . Hypothyroidism   . MI (myocardial infarction) ([place]) 2005   angio with stent  . Positive PPD    neg. CXR, treated with INH for 1 yr.  about 30  . UTI (urinary tract infection) 05/31/15    Past Surgical History:  Procedure Laterality Date  . BUNIONECTOMY    . COLONOSCOPY    . CORONARY ANGIOPLASTY  2009  . CORONARY ANGIOPLASTY WITH STENT PLACEMENT  2005  . HEMORRHOID SURGERY  06/2015   Removal   . LAPAROSCOPY     for endometriosis  . LEFT HEART CATHETERIZATION WITH CORONARY ANGIOGRAM Bilateral 06/27/2011   Procedure: LEFT HEART CATHETERIZATION WITH CORONARY ANGIOGRAM;  Surgeon: Burnell Blanks, MD;  Location: [facility] CATH LAB;  Service: Cardiovascular;  Laterality: Bilateral;  . LUMBAR EPIDURAL INJECTION     many  . NERVE ROOT BLOCK     epidural steroids; multiple  . POLYPECTOMY    . ruptured disc  2007   with epidural   . TONGUE BIOPSY     keratin accumulation - Benign   . TONSILLECTOMY    . TOTAL HIP ARTHROPLASTY Right 01/21/2018   Procedure: RIGHT TOTAL HIP ARTHROPLASTY ANTERIOR APPROACH;  Surgeon: Paralee Cancel, MD;  Location: [facility] ORS;  Service: Orthopedics;  Laterality: Right;  10mins  . TUBAL LIGATION      Family History  Problem Relation Age of Onset  . Colon cancer Mother 58  . Stroke Mother   . Hypertension Mother   . Diabetes Mother   . Rheum arthritis Mother   . Atrial fibrillation Mother   . Heart attack Father        x2  . Diabetes Father    . Kidney disease Father   . Heart disease Father   . Stroke Maternal Grandmother   . Colon cancer Paternal Grandmother   . Diabetes Paternal Grandmother   . Esophageal cancer Cousin 61    Social History   Socioeconomic History  . Marital status: Divorced    Spouse name: Not on file  . Number of children: 3  . Years of education: Not on file  . Highest education level: Not on file  Occupational History  . Occupation: Programmer, multimedia: [employer]  Tobacco Use  .  Smoking status: Never Smoker  . Smokeless tobacco: Never Used  Substance and Sexual Activity  . Alcohol use: Yes    Alcohol/week: 1.0 standard drinks    Types: 1 Glasses of wine per week    Comment: occ wine  . Drug use: No  . Sexual activity: Not Currently    Birth control/protection: Post-menopausal    Comment: BTL 07/03/89  Other Topics Concern  . Not on file  Social History Narrative  . Not on file   Social Determinants of Health   Financial Resource Strain:   . Difficulty of Paying Living Expenses:   Food Insecurity:   . Worried About Charity fundraiser in the Last Year:   . Arboriculturist in the Last Year:   Transportation Needs:   . Film/video editor (Medical):   Marland Kitchen Lack of Transportation (Non-Medical):   Physical Activity:   . Days of Exercise per Week:   . Minutes of Exercise per Session:   Stress:   . Feeling of Stress :   Social Connections:   . Frequency of Communication with Friends and Family:   . Frequency of Social Gatherings with Friends and Family:   . Attends Religious Services:   . Active Member of Clubs or Organizations:   . Attends Archivist Meetings:   Marland Kitchen Marital Status:   Intimate Partner Violence:   . Fear of Current or Ex-Partner:   . Emotionally Abused:   Marland Kitchen Physically Abused:   . Sexually Abused:       Review of systems:  All other review of systems negative except as mentioned in the HPI.   Physical Exam: Vitals:   09/11/19 1049  BP:  110/74  Pulse: 77  Temp: 98.7 F (37.1 C)   Body mass index is 28.35 kg/m. Gen:      No acute distress Neuro: alert and oriented x 3 Psych: normal mood and affect  Data Reviewed:  Reviewed labs, radiology imaging, old records and pertinent past GI work up   Assessment and Plan/Recommendations:  64 year old very pleasant female with family history of colon cancer here for follow-up visit for GERD and chronic idiopathic constipation  GERD: Dexilant 30 mg, 2 capsules daily Antireflux measures  Dyspepsia and excessive belching: Trial of FD guard 1 capsule up to 3 times daily  Epigastric abdominal pain: Right upper quadrant ultrasound to exclude gallbladder disease  Family history of colon cancer, due for recall colonoscopy March 2022  Chronic idiopathic constipation: Continue MiraLAX, Colace and Benefiber, titrate to 1-2 soft bowel movements daily    This visit required 30 minutes of patient care (this includes precharting, chart review, review of results, face-to-face time used for counseling as well as treatment plan and follow-up. The patient was provided an opportunity to ask questions and all were answered. The patient agreed with the plan and demonstrated an understanding of the instructions.  Damaris Hippo , MD    CC: Shon Baton, MD

## 2019-09-14 DIAGNOSIS — F4310 Post-traumatic stress disorder, unspecified: Secondary | ICD-10-CM | POA: Diagnosis not present

## 2019-09-15 ENCOUNTER — Encounter: Payer: Self-pay | Admitting: Gastroenterology

## 2019-09-17 DIAGNOSIS — F329 Major depressive disorder, single episode, unspecified: Secondary | ICD-10-CM | POA: Diagnosis not present

## 2019-09-17 DIAGNOSIS — E039 Hypothyroidism, unspecified: Secondary | ICD-10-CM | POA: Diagnosis not present

## 2019-09-17 DIAGNOSIS — R32 Unspecified urinary incontinence: Secondary | ICD-10-CM | POA: Diagnosis not present

## 2019-09-17 DIAGNOSIS — I2510 Atherosclerotic heart disease of native coronary artery without angina pectoris: Secondary | ICD-10-CM | POA: Diagnosis not present

## 2019-09-17 DIAGNOSIS — I10 Essential (primary) hypertension: Secondary | ICD-10-CM | POA: Diagnosis not present

## 2019-09-17 DIAGNOSIS — F4310 Post-traumatic stress disorder, unspecified: Secondary | ICD-10-CM | POA: Diagnosis not present

## 2019-09-17 DIAGNOSIS — E669 Obesity, unspecified: Secondary | ICD-10-CM | POA: Diagnosis not present

## 2019-09-17 DIAGNOSIS — K219 Gastro-esophageal reflux disease without esophagitis: Secondary | ICD-10-CM | POA: Diagnosis not present

## 2019-09-17 DIAGNOSIS — M1990 Unspecified osteoarthritis, unspecified site: Secondary | ICD-10-CM | POA: Diagnosis not present

## 2019-09-17 DIAGNOSIS — M545 Low back pain: Secondary | ICD-10-CM | POA: Diagnosis not present

## 2019-09-17 DIAGNOSIS — D692 Other nonthrombocytopenic purpura: Secondary | ICD-10-CM | POA: Diagnosis not present

## 2019-09-18 ENCOUNTER — Other Ambulatory Visit: Payer: Self-pay

## 2019-09-18 ENCOUNTER — Ambulatory Visit (HOSPITAL_COMMUNITY)
Admission: RE | Admit: 2019-09-18 | Discharge: 2019-09-18 | Disposition: A | Discharge status: Home / Self Care | Payer: PPO | Source: Ambulatory Visit | Attending: Gastroenterology | Admitting: Gastroenterology

## 2019-09-18 DIAGNOSIS — K219 Gastro-esophageal reflux disease without esophagitis: Secondary | ICD-10-CM | POA: Insufficient documentation

## 2019-09-18 DIAGNOSIS — R109 Unspecified abdominal pain: Secondary | ICD-10-CM | POA: Diagnosis not present

## 2019-09-18 DIAGNOSIS — K8020 Calculus of gallbladder without cholecystitis without obstruction: Secondary | ICD-10-CM | POA: Diagnosis not present

## 2019-09-23 ENCOUNTER — Telehealth: Payer: Self-pay | Admitting: Gastroenterology

## 2019-09-24 ENCOUNTER — Other Ambulatory Visit: Payer: Self-pay

## 2019-09-24 NOTE — Telephone Encounter (Signed)
Called the patient back. No answer. Got her voicemail. Left a message for her to advise we will look into this.

## 2019-09-24 NOTE — Telephone Encounter (Signed)
Pharmacist says no rejection claim from the insurance and the co-pay was the same as it was for the Dexilant 60 mg.  Patient has picked up the prescription for the Dexilant 30 mg 2 daily.

## 2019-09-26 IMAGING — DX DG PORTABLE PELVIS
1 series · 1 of 1 positions shown · non-contrast
Comparison: Intraoperative C-arm views 01/21/2018.

CLINICAL DATA: 62-year-old female post right hip replacement.
Initial encounter.

EXAM:
PORTABLE PELVIS 1-2 VIEWS

[pelvis ap]
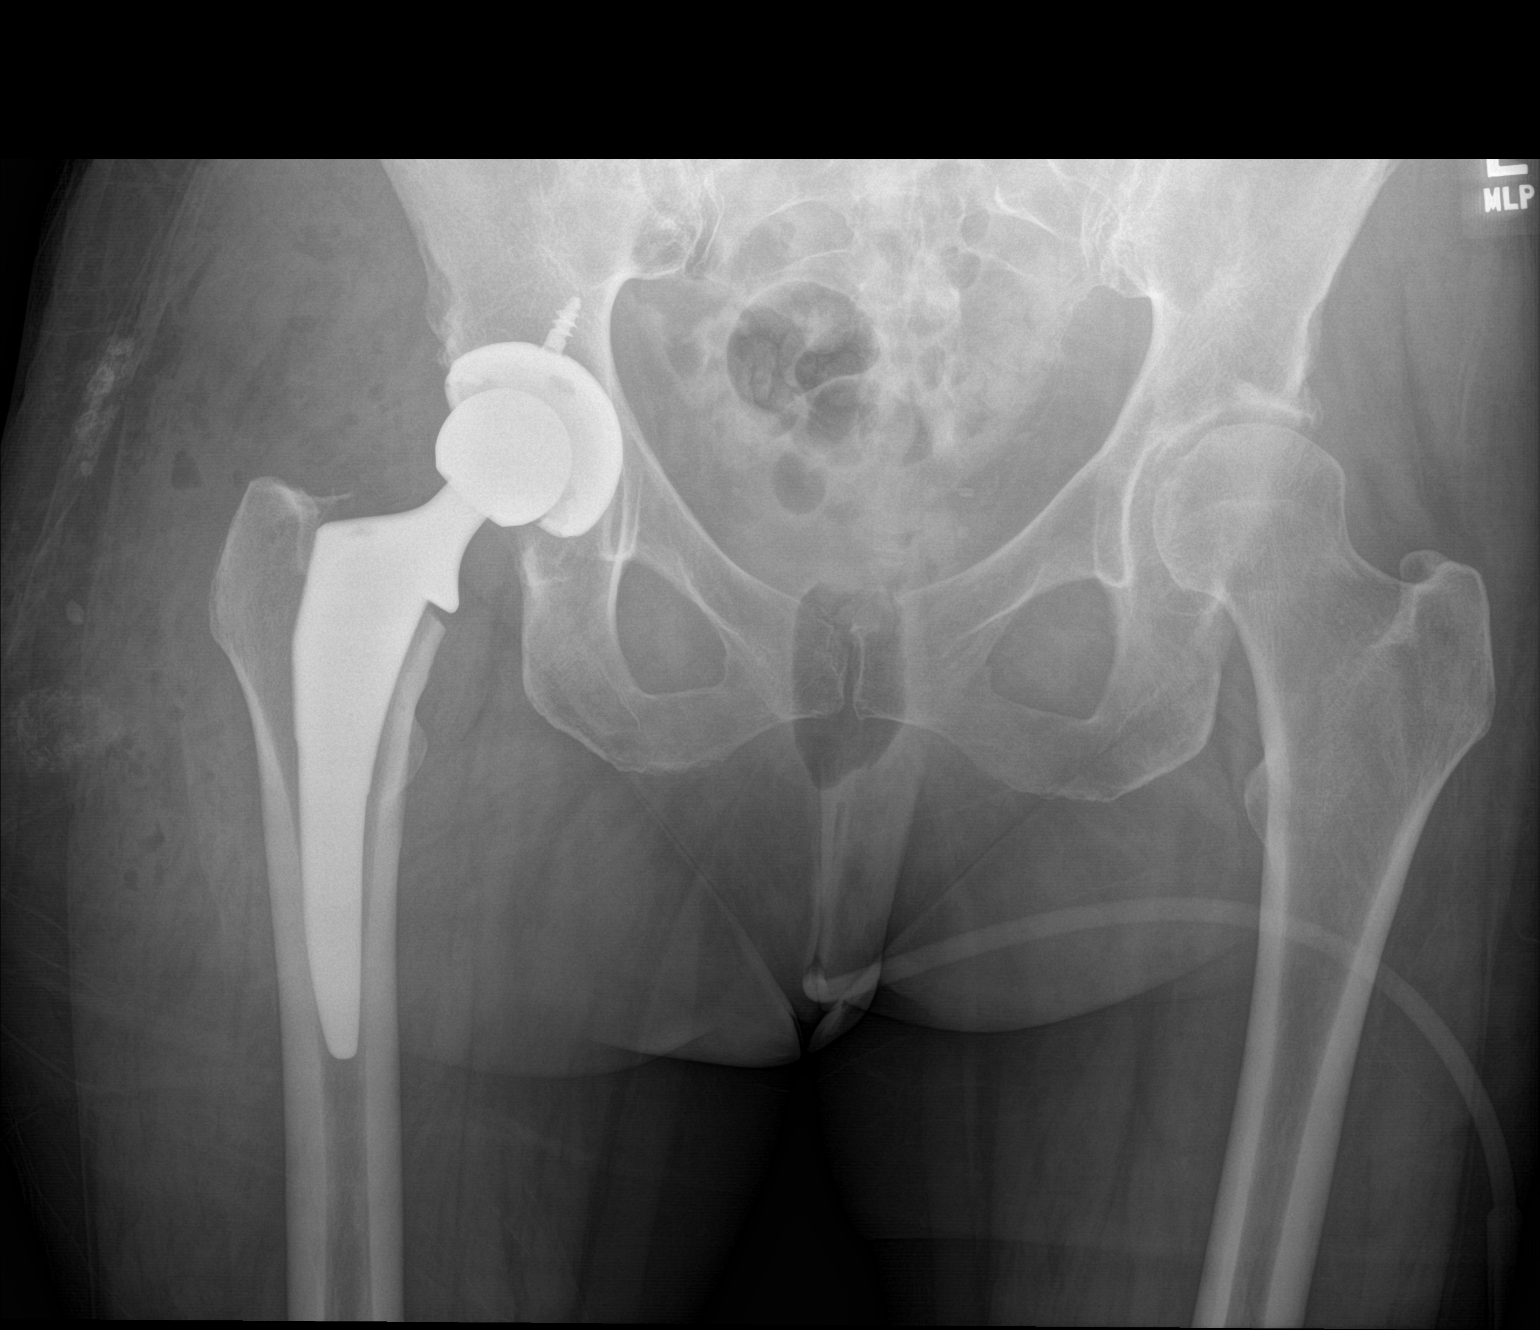

[1 of 1 positions shown; findings below may reference images not displayed]

FINDINGS: Post total right hip replacement. This appears in satisfactory
position without complication noted on frontal projection.
IMPRESSION: Post total right hip replacement.

## 2019-10-12 ENCOUNTER — Telehealth: Payer: Self-pay

## 2019-10-12 NOTE — Telephone Encounter (Signed)
Patient called.  Returned her call. No answer. Left message on the voicemail of returning her call.

## 2019-10-12 NOTE — Telephone Encounter (Signed)
Patient is calling to follow up and she said she is going top get imodium because she can not take it any longer has started a bland diet and still not working.

## 2019-10-12 NOTE — Telephone Encounter (Addendum)
Doc of the day- patient of Dr Woodward Ku  Spoke with this very kind patient. She has been experiencing diarrhea. This occurs every time she eats. Soon after eating she will have to go to the bathroom urgently. The stool is "muddy water." She stopped Miralax, stopped Benefiber and has stopped Peri-Colace. She is eating a BRAT diet or similar, but no spicy or fried foods. She wonders if the Dexilant could contribute to her symptoms. She has recently suffered losses with the passing of her son, her father and most recently the death a friend. She has not had fever, abdominal pain or bloody stools. Through discussion with the patient, she has decided she will avoid artificial sweeteners, add Bene fiber back 1 tsp with meals and may use a dose of Imodium to gain control of her stools. She will be cautious with Imodium due to her history of constipation. She will hold Dexilant 24 hours to decide if this has contributed to her diarrhea. She is encouraged to call back if her measures have not improved her symptoms.

## 2019-10-12 NOTE — Telephone Encounter (Signed)
Agree Sarah Ballard with what is written Dexilant can cause loose stools or diarrhea, but she can hold it for several days and if better possibly try it again to see if it is the definite reason. Thanks [employer]

## 2019-11-27 ENCOUNTER — Other Ambulatory Visit: Payer: Self-pay | Admitting: Cardiovascular Disease

## 2019-12-21 ENCOUNTER — Other Ambulatory Visit: Payer: Self-pay

## 2019-12-21 MED ORDER — CLOPIDOGREL BISULFATE 75 MG PO TABS: 75.0000 mg | ORAL_TABLET | Freq: Every day | ORAL | 7 refills | Status: DC

## 2019-12-29 DIAGNOSIS — M1831 Unilateral post-traumatic osteoarthritis of first carpometacarpal joint, right hand: Secondary | ICD-10-CM | POA: Diagnosis not present

## 2019-12-29 DIAGNOSIS — M720 Palmar fascial fibromatosis [Dupuytren]: Secondary | ICD-10-CM | POA: Diagnosis not present

## 2020-01-01 DIAGNOSIS — E785 Hyperlipidemia, unspecified: Secondary | ICD-10-CM | POA: Diagnosis not present

## 2020-01-01 DIAGNOSIS — E039 Hypothyroidism, unspecified: Secondary | ICD-10-CM | POA: Diagnosis not present

## 2020-01-08 DIAGNOSIS — I119 Hypertensive heart disease without heart failure: Secondary | ICD-10-CM | POA: Diagnosis not present

## 2020-01-08 DIAGNOSIS — I2510 Atherosclerotic heart disease of native coronary artery without angina pectoris: Secondary | ICD-10-CM | POA: Diagnosis not present

## 2020-01-08 DIAGNOSIS — E039 Hypothyroidism, unspecified: Secondary | ICD-10-CM | POA: Diagnosis not present

## 2020-01-08 DIAGNOSIS — Z23 Encounter for immunization: Secondary | ICD-10-CM | POA: Diagnosis not present

## 2020-01-08 DIAGNOSIS — M545 Low back pain: Secondary | ICD-10-CM | POA: Diagnosis not present

## 2020-01-08 DIAGNOSIS — E669 Obesity, unspecified: Secondary | ICD-10-CM | POA: Diagnosis not present

## 2020-01-08 DIAGNOSIS — E785 Hyperlipidemia, unspecified: Secondary | ICD-10-CM | POA: Diagnosis not present

## 2020-01-08 DIAGNOSIS — D692 Other nonthrombocytopenic purpura: Secondary | ICD-10-CM | POA: Diagnosis not present

## 2020-01-08 DIAGNOSIS — R296 Repeated falls: Secondary | ICD-10-CM | POA: Diagnosis not present

## 2020-01-08 DIAGNOSIS — F329 Major depressive disorder, single episode, unspecified: Secondary | ICD-10-CM | POA: Diagnosis not present

## 2020-01-08 DIAGNOSIS — F4310 Post-traumatic stress disorder, unspecified: Secondary | ICD-10-CM | POA: Diagnosis not present

## 2020-01-08 DIAGNOSIS — Z0000 Encounter for general adult medical examination without abnormal findings: Secondary | ICD-10-CM | POA: Diagnosis not present

## 2020-01-16 ENCOUNTER — Other Ambulatory Visit: Payer: Self-pay | Admitting: Cardiovascular Disease

## 2020-01-29 ENCOUNTER — Other Ambulatory Visit: Payer: Self-pay | Admitting: Cardiovascular Disease

## 2020-02-22 ENCOUNTER — Other Ambulatory Visit: Payer: Self-pay

## 2020-02-22 ENCOUNTER — Ambulatory Visit: Payer: PPO | Admitting: Cardiovascular Disease

## 2020-02-22 ENCOUNTER — Encounter: Payer: Self-pay | Admitting: Cardiovascular Disease

## 2020-02-22 VITALS — BP 114/88 | HR 72 | Ht 65.5 in | Wt 172.6 lb

## 2020-02-22 DIAGNOSIS — E782 Mixed hyperlipidemia: Secondary | ICD-10-CM

## 2020-02-22 DIAGNOSIS — I2510 Atherosclerotic heart disease of native coronary artery without angina pectoris: Secondary | ICD-10-CM | POA: Diagnosis not present

## 2020-02-22 NOTE — Progress Notes (Signed)
Sarah Ballard Date of Birth  [birth year] [facility] [address]    [place], [place]  [address] [phone]  Fax  [phone]  Problem List 1. Coronary artery disease-  2. anxiety 3. Hyperlipidemia 4. Hypertension 5. Hypothyroidism 6. Chronic back pain     The 64 yo  female with a history of coronary artery disease. She's status post PTCA and stenting of her left anterior descending artery. She also had Cutting Balloon procedure and reexpansion of that stent in January 2010.  She was admitted recently to the hospital for chest pain an dizziness and cath revealed no significant irregularities.  He is walking twice a week. She's not having episodes of angina with walking. 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  October 27, 2012:  Sarah Ballard is doing well from a cardiac standpoint.   She is having some back pain - needs to have a back injection but does not want to stop the Plavix.    October 27, 2013:  Sarah Ballard is doing well.  She denies any chest pain.  Bumping into lots of things and has bruising on her legs.   Her potassium was low at her last check.    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  Asked about stopping the plavix   Jan. 24, 2017:  Sarah Ballard is doing ok Has been losing weight.   Lots of stress with taking care of her father and her divorce and her son .  No CP .     November 17, 2015:  Doing ok.  Sleepy .  Had to work late last night . No CP,  Breathing is good Lots of anxiety with work stress.   July 12, 2016:  Doing ok from a cardiac standpoint Has gained some weight .  Rare episodes of CP - she thinks its due to stress or indigestion,   its not related to exertion  requests a refill of her NTG  Is trying to move  Is having lots of artheritis   Pain  Is falling lots at work   Nov. 14,  2018  Has  Moved since I last saw her. Is having some chest pain  Thinks its GERD or anxeity.  Lots of stress  Lots of stress related to the move.    The CP are atypical , do not feel like her previouis angina Takes NTG - does not really relieve the CP Finds [employer] work   Is planning on having bunion surgery at the end of this year Will be at low risk Will need to hold Plavix for 5 days prior to surgery .    Father has gained some fluid .   Feb. 27, 2019 Doing well Is very appreciative that I sent her to see Ezekiel Slocumb for her anxiety .  Father recently had the flu  Sarah Ballard has had a cough / cold.   Developed quickly. Has been seen by Dr. 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    December 26, 2017:  Sarah Ballard is seen today for follow-up visit. MRI of her hip shows that she needs to have a hip replacement Sees Dr. Alvan Dame . No angina    April, 23, 2021:  Sarah Ballard is doing well  Has some indigestion  Not relieved with  protonix.  , suggested pepcid complete in addition to her Protonix   No angina  Needs to exercise more.  Has gained some weight Has been getting short of breath while walking ,  I suspect most of this dyspnea is due to weight gain and general deconditioning .  No angina similar to her presenting symptoms of angina pain from years ago .   February 22, 2020: Sarah Ballard is doing well.  She is seen today for follow-up of her coronary artery disease and hyperlipidemia.  Her blood pressure and heart rate are normal. Had covid Sept. 22 .   Has had 2 Pfizer covid vaccines and now has had the booster (oct. 22) . Marland Kitchen Had chills, headache  vomitting , sneezing for a few days  Is still fatigued.    Current Outpatient Medications on File Prior to Visit  Medication Sig Dispense Refill  . ALPRAZolam (XANAX) 0.5 MG tablet Take 0.5 mg by mouth 2 (two) times daily.     Marland Kitchen amitriptyline (ELAVIL) 10 MG tablet Take 20 mg by mouth at bedtime.    . betamethasone  valerate ointment (VALISONE) 0.1 % Apply a pea sized amount topically BID for 1-2 weeks as needed. Not for daily long term use. 15 g 0  . busPIRone (BUSPAR) 5 MG tablet Take 5 mg by mouth 2 (two) times daily.     . calcium carbonate (TUMS - DOSED IN MG ELEMENTAL CALCIUM) 500 MG chewable tablet Chew 1-2 tablets by mouth 3 (three) times daily as needed for indigestion or heartburn.    . Calcium Carbonate-Vitamin D (CALTRATE 600+D) 600-400 MG-UNIT per tablet Take 1 tablet daily by mouth.     . clopidogrel (PLAVIX) 75 MG tablet Take 1 tablet (75 mg total) by mouth daily. 30 tablet 7  . cyclobenzaprine (FLEXERIL) 10 MG tablet Take 1 tablet (10 mg total) by mouth 3 (three) times daily as needed for muscle spasms. 30 tablet 0  . DULoxetine (CYMBALTA) 30 MG capsule Take 30 mg by mouth 3 (three) times daily.    Marland Kitchen estradiol (ESTRACE) 0.1 MG/GM vaginal cream 1 gram vaginally twice weekly 42.5 g 1  . famotidine (PEPCID) 20 MG tablet Take 20 mg by mouth 2 (two) times daily.    . fluticasone (FLONASE) 50 MCG/ACT nasal spray Place 2 sprays into both nostrils daily.     . irbesartan (AVAPRO) 150 MG tablet Take 1 tablet (150 mg total) by mouth daily. 90 tablet 2  . levothyroxine (SYNTHROID) 112 MCG tablet Take 112 mcg by mouth daily before breakfast.    . Magnesium 400 MG CAPS Take 400 mg by mouth daily.    . metoprolol tartrate (LOPRESSOR) 25 MG tablet TAKE 1/2 TABLET BY MOUTH TWICE A DAY WITH FOOD 90 tablet 3  . nitroGLYCERIN (NITROSTAT) 0.4 MG SL tablet Place 1 tablet (0.4 mg total) under the tongue every 5 (five) minutes as needed. For chest pain 25 tablet 3  . omega-3 acid ethyl esters (LOVAZA) 1 G capsule Take 2 g by mouth 2 (two) times daily.      Marland Kitchen oxybutynin (DITROPAN-XL) 10 MG 24 hr tablet Take 10 mg by mouth daily.  11  . polyethylene glycol (MIRALAX / GLYCOLAX) packet Take 17 g by mouth 2 (two) times daily. 14 each 0  . rosuvastatin (CRESTOR) 20 MG tablet TAKE ONE TABLET BY MOUTH DAILY 90 tablet 3  .  Wheat Dextrin (BENEFIBER) POWD Take 1 Scoop by mouth 3 (three) times daily. As needed  No current facility-administered medications on file prior to visit.    Allergies  Allergen Reactions  . Latex     itching  . Lisinopril Cough  . Tramadol Hcl Nausea And Vomiting    Past Medical History:  Diagnosis Date  . Angina pectoris ([place]) 2005   with stent placement Anterior decscending  . Anxiety   . Arthritis   . Bone spur    Right Hip  . CAD (coronary artery disease) 2005   s/p PCI LAD and cutting balloon angioplasty  with stent reexpansion 04/2008  . Colon polyps   . Degenerative disc disease   . Depression   . GERD (gastroesophageal reflux disease)   . Hypercholesterolemia   . Hyperlipemia   . Hypertension   . Hypothyroidism   . MI (myocardial infarction) ([place]) 2005   angio with stent  . Positive PPD    neg. CXR, treated with INH for 1 yr.  about 61  . UTI (urinary tract infection) 05/31/15    Past Surgical History:  Procedure Laterality Date  . BUNIONECTOMY    . COLONOSCOPY    . CORONARY ANGIOPLASTY  2009  . CORONARY ANGIOPLASTY WITH STENT PLACEMENT  2005  . HEMORRHOID SURGERY  06/2015   Removal   . LAPAROSCOPY     for endometriosis  . LEFT HEART CATHETERIZATION WITH CORONARY ANGIOGRAM Bilateral 06/27/2011   Procedure: LEFT HEART CATHETERIZATION WITH CORONARY ANGIOGRAM;  Surgeon: Burnell Blanks, MD;  Location: [facility] C[facility] LAB;  Service: Cardiovascular;  Laterality: Bilateral;  . LUMBAR EPIDURAL INJECTION     many  . NERVE ROOT BLOCK     epidural steroids; multiple  . POLYPECTOMY    . ruptured disc  2007   with epidural   . TONGUE BIOPSY     keratin accumulation - Benign   . TONSILLECTOMY    . TOTAL HIP ARTHROPLASTY Right 01/21/2018   Procedure: RIGHT TOTAL HIP ARTHROPLASTY ANTERIOR APPROACH;  Surgeon: Paralee Cancel, MD;  Location: [facility] ORS;  Service: Orthopedics;  Laterality: Right;  79mins  . TUBAL LIGATION      Social History   Tobacco Use   Smoking Status Never Smoker  Smokeless Tobacco Never Used    Social History   Substance and Sexual Activity  Alcohol Use Yes  . Alcohol/week: 1.0 standard drink  . Types: 1 Glasses of wine per week   Comment: occ wine    Family History  Problem Relation Age of Onset  . Colon cancer Mother 3  . Stroke Mother   . Hypertension Mother   . Diabetes Mother   . Rheum arthritis Mother   . Atrial fibrillation Mother   . Heart attack Father        x2  . Diabetes Father   . Kidney disease Father   . Heart disease Father   . Stroke Maternal Grandmother   . Colon cancer Paternal Grandmother   . Diabetes Paternal Grandmother   . Esophageal cancer Cousin 20    Reviw of Systems:  Reviewed in the HPI.  All other systems are negative.   Physical Exam: Blood pressure 114/88, pulse 72, height 5' 5.5" (1.664 m), weight 172 lb 9.6 oz (78.3 kg), last menstrual period 06/29/2006, SpO2 97 %.  GEN:  Well nourished, well developed in no acute distress HEENT: Normal NECK: No JVD; No carotid bruits LYMPHATICS: No lymphadenopathy CARDIAC: RRR , no murmurs, rubs, gallops RESPIRATORY:  Clear to auscultation without rales, wheezing or rhonchi  ABDOMEN: Soft, non-tender, non-distended MUSCULOSKELETAL:  No edema; No deformity  SKIN: Warm and dry NEUROLOGIC:  Alert and oriented x 3   ECG :   Assessment / Plan:   1. Coronary artery disease-    Is not having any angina .   Cont meds.  Advised her to exercise more   2. Anxiety -    Continues to see Ezekiel Slocumb for counseling .   3. Hyperlipidemia -       Will get lipids, liver , bmp at her next office visit   4. Hypertension  -    BP is well controlled.   5. Hypothyroidism  6. Chronic back pain  7. Weight gain :    Advised more exercise    will have her see Richardson Dopp, PA in 6 months for office visit and fasting labs  I'll see her in 1 year.   Mertie Moores, MD  02/22/2020 10:31 AM    [address],  [place], [place]  [address] Pager [phone] Phone: [phone]; Fax: [phone]

## 2020-02-22 NOTE — Patient Instructions (Signed)
Medication Instructions:  NO CHANGES *If you need a refill on your cardiac medications before your next appointment, please call your pharmacy*   Lab Work: NONE If you have labs (blood work) drawn today and your tests are completely normal, you will receive your results only by: Marland Kitchen MyChart Message (if you have MyChart) OR . A paper copy in the mail If you have any lab test that is abnormal or we need to change your treatment, we will call you to review the results.   Testing/Procedures: NONE   Follow-Up: At [facility], you and your health needs are our priority.  As part of our continuing mission to provide you with exceptional heart care, we have created designated Provider Care Teams.  These Care Teams include your primary Cardiologist (physician) and Advanced Practice Providers (APPs -  Physician Assistants and Nurse Practitioners) who all work together to provide you with the care you need, when you need it.  We recommend signing up for the patient portal called "MyChart".  Sign up information is provided on this After Visit Summary.  MyChart is used to connect with patients for Virtual Visits (Telemedicine).  Patients are able to view lab/test results, encounter notes, upcoming appointments, etc.  Non-urgent messages can be sent to your provider as well.   To learn more about what you can do with MyChart, go to [email or web address].    Your next appointment:   6 month(s)  The format for your next appointment:   In Person  Provider:   You will see one of the following Advanced Practice Providers on your designated Care Team:    Richardson Dopp, [place]

## 2020-03-29 DIAGNOSIS — R350 Frequency of micturition: Secondary | ICD-10-CM | POA: Diagnosis not present

## 2020-03-29 DIAGNOSIS — N3946 Mixed incontinence: Secondary | ICD-10-CM | POA: Diagnosis not present

## 2020-04-01 DIAGNOSIS — Z1231 Encounter for screening mammogram for malignant neoplasm of breast: Secondary | ICD-10-CM | POA: Diagnosis not present

## 2020-04-14 DIAGNOSIS — Z1212 Encounter for screening for malignant neoplasm of rectum: Secondary | ICD-10-CM | POA: Diagnosis not present

## 2020-05-19 DIAGNOSIS — M720 Palmar fascial fibromatosis [Dupuytren]: Secondary | ICD-10-CM | POA: Diagnosis not present

## 2020-05-19 DIAGNOSIS — M19041 Primary osteoarthritis, right hand: Secondary | ICD-10-CM | POA: Diagnosis not present

## 2020-05-19 DIAGNOSIS — M1831 Unilateral post-traumatic osteoarthritis of first carpometacarpal joint, right hand: Secondary | ICD-10-CM | POA: Diagnosis not present

## 2020-07-07 DIAGNOSIS — F329 Major depressive disorder, single episode, unspecified: Secondary | ICD-10-CM | POA: Diagnosis not present

## 2020-07-07 DIAGNOSIS — K219 Gastro-esophageal reflux disease without esophagitis: Secondary | ICD-10-CM | POA: Diagnosis not present

## 2020-07-07 DIAGNOSIS — I2510 Atherosclerotic heart disease of native coronary artery without angina pectoris: Secondary | ICD-10-CM | POA: Diagnosis not present

## 2020-07-07 DIAGNOSIS — K5900 Constipation, unspecified: Secondary | ICD-10-CM | POA: Diagnosis not present

## 2020-07-07 DIAGNOSIS — M545 Low back pain, unspecified: Secondary | ICD-10-CM | POA: Diagnosis not present

## 2020-07-07 DIAGNOSIS — I119 Hypertensive heart disease without heart failure: Secondary | ICD-10-CM | POA: Diagnosis not present

## 2020-07-07 DIAGNOSIS — R296 Repeated falls: Secondary | ICD-10-CM | POA: Diagnosis not present

## 2020-07-07 DIAGNOSIS — F4310 Post-traumatic stress disorder, unspecified: Secondary | ICD-10-CM | POA: Diagnosis not present

## 2020-07-07 DIAGNOSIS — R32 Unspecified urinary incontinence: Secondary | ICD-10-CM | POA: Diagnosis not present

## 2020-07-07 DIAGNOSIS — M1990 Unspecified osteoarthritis, unspecified site: Secondary | ICD-10-CM | POA: Diagnosis not present

## 2020-07-07 DIAGNOSIS — E669 Obesity, unspecified: Secondary | ICD-10-CM | POA: Diagnosis not present

## 2020-07-07 DIAGNOSIS — E785 Hyperlipidemia, unspecified: Secondary | ICD-10-CM | POA: Diagnosis not present

## 2020-07-07 DIAGNOSIS — D692 Other nonthrombocytopenic purpura: Secondary | ICD-10-CM | POA: Diagnosis not present

## 2020-07-07 DIAGNOSIS — E039 Hypothyroidism, unspecified: Secondary | ICD-10-CM | POA: Diagnosis not present

## 2020-07-07 DIAGNOSIS — F419 Anxiety disorder, unspecified: Secondary | ICD-10-CM | POA: Diagnosis not present

## 2020-07-19 ENCOUNTER — Ambulatory Visit: Payer: PPO | Admitting: Physician Assistant

## 2020-07-19 ENCOUNTER — Encounter: Payer: Self-pay | Admitting: Physician Assistant

## 2020-07-19 ENCOUNTER — Telehealth: Payer: Self-pay

## 2020-07-19 VITALS — BP 122/74 | HR 67 | Ht 65.5 in | Wt 180.0 lb

## 2020-07-19 DIAGNOSIS — Z800 Family history of malignant neoplasm of digestive organs: Secondary | ICD-10-CM

## 2020-07-19 DIAGNOSIS — Z7901 Long term (current) use of anticoagulants: Secondary | ICD-10-CM | POA: Diagnosis not present

## 2020-07-19 MED ORDER — NA SULFATE-K SULFATE-MG SULF 17.5-3.13-1.6 GM/177ML PO SOLN: 1.0000 | Freq: Once | ORAL | 0 refills | Status: AC

## 2020-07-19 NOTE — Telephone Encounter (Signed)
Sarah Ballard may hold her plavix for 5-7 days prior to her colonoscopy. Restart per the GI doctor

## 2020-07-19 NOTE — Telephone Encounter (Signed)
   Primary Cardiologist: Mertie Moores, MD  Chart reviewed as part of pre-operative protocol coverage. Given past medical history and time since last visit, based on ACC/AHA guidelines, Sarah Ballard would be at acceptable risk for the planned procedure without further cardiovascular testing.   Her Plavix may be held for 5-7 days prior to her procedure.  Please resume when hemostasis is achieved at the discretion of the surgeon.  I will route this recommendation to the requesting party via Epic fax function and remove from pre-op pool.  Please call with questions.  Jossie Ng. Ileigh Mettler NP-C    07/19/2020, 1:17 PM [address] Office [phone] Fax [phone]

## 2020-07-19 NOTE — Telephone Encounter (Signed)
Sarah Ballard 65 year old female who is requesting preoperative cardiac evaluation for colonoscopy.  She was last seen in the office 02/22/2020.  She was doing well at that time.  She reported she had Covid 9/22.  She had positive vaccination x2 and also received her booster.  She reported having chills, headache, vomiting, sneezing, and reported still being somewhat fatigued.  Her PMH includes coronary artery disease, anxiety, hyperlipidemia, hypertension, hypothyroidism, and chronic back pain.  She underwent LHC 2005 with PCI to her LAD and Cutting Balloon angioplasty with stent reexpansion 1/10.   May her Plavix be held prior to her procedure?  Thank you for your help.  Please direct response to CV DIV preop pool.  Jossie Ng. [place] Blucher NP-C    07/19/2020, 10:55 AM [place] Office [phone] Fax [phone]

## 2020-07-19 NOTE — Progress Notes (Signed)
Chief Complaint: Surveillance colonoscopy in a patient on Plavix  HPI:    Sarah Ballard is a 65 year old Caucasian female with a past medical history as listed below including CAD on Plavix as well as a family history of colon cancer, known to Dr. Silverio Decamp, who was referred to me by Shon Baton, MD for surveillance of adenomatous polyps.      07/08/2015 colonoscopy Dr. Silverio Decamp with nonbleeding internal hemorrhoids and otherwise normal.  Repeat recommended in 5 years due to a family history of colon cancer in a first-degree relative.    Today, the patient presents to clinic and tells me that she is doing well other than battling depression at the moment.  She has no GI complaints.    Helps take care of her 30 and 52-year-old grandchildren after school.    Denies fever, chills, change in bowel habits, blood in her stool or abdominal pain.  Past Medical History:  Diagnosis Date  . Angina pectoris ([place]) 2005   with stent placement Anterior decscending  . Anxiety   . Arthritis   . Bone spur    Right Hip  . CAD (coronary artery disease) 2005   s/p PCI LAD and cutting balloon angioplasty  with stent reexpansion 04/2008  . Colon polyps   . Degenerative disc disease   . Depression   . GERD (gastroesophageal reflux disease)   . Hypercholesterolemia   . Hyperlipemia   . Hypertension   . Hypothyroidism   . MI (myocardial infarction) ([place]) 2005   angio with stent  . Positive PPD    neg. CXR, treated with INH for 1 yr.  about 21  . UTI (urinary tract infection) 05/31/15    Past Surgical History:  Procedure Laterality Date  . BUNIONECTOMY    . COLONOSCOPY    . CORONARY ANGIOPLASTY  2009  . CORONARY ANGIOPLASTY WITH STENT PLACEMENT  2005  . HEMORRHOID SURGERY  06/2015   Removal   . LAPAROSCOPY     for endometriosis  . LEFT HEART CATHETERIZATION WITH CORONARY ANGIOGRAM Bilateral 06/27/2011   Procedure: LEFT HEART CATHETERIZATION WITH CORONARY ANGIOGRAM;  Surgeon: Burnell Blanks,  MD;  Location: [facility] CATH LAB;  Service: Cardiovascular;  Laterality: Bilateral;  . LUMBAR EPIDURAL INJECTION     many  . NERVE ROOT BLOCK     epidural steroids; multiple  . POLYPECTOMY    . ruptured disc  2007   with epidural   . TONGUE BIOPSY     keratin accumulation - Benign   . TONSILLECTOMY    . TOTAL HIP ARTHROPLASTY Right 01/21/2018   Procedure: RIGHT TOTAL HIP ARTHROPLASTY ANTERIOR APPROACH;  Surgeon: Paralee Cancel, MD;  Location: [facility] ORS;  Service: Orthopedics;  Laterality: Right;  59mins  . TUBAL LIGATION      Current Outpatient Medications  Medication Sig Dispense Refill  . ALPRAZolam (XANAX) 0.5 MG tablet Take 0.5 mg by mouth 2 (two) times daily.     Marland Kitchen amitriptyline (ELAVIL) 10 MG tablet Take 20 mg by mouth at bedtime.    . betamethasone valerate ointment (VALISONE) 0.1 % Apply a pea sized amount topically BID for 1-2 weeks as needed. Not for daily long term use. 15 g 0  . busPIRone (BUSPAR) 5 MG tablet Take 5 mg by mouth 2 (two) times daily.     . calcium carbonate (TUMS - DOSED IN MG ELEMENTAL CALCIUM) 500 MG chewable tablet Chew 1-2 tablets by mouth 3 (three) times daily as needed for indigestion or heartburn.    Marland Kitchen  Calcium Carbonate-Vitamin D (CALTRATE 600+D) 600-400 MG-UNIT per tablet Take 1 tablet daily by mouth.     . clopidogrel (PLAVIX) 75 MG tablet Take 1 tablet (75 mg total) by mouth daily. 30 tablet 7  . cyclobenzaprine (FLEXERIL) 10 MG tablet Take 1 tablet (10 mg total) by mouth 3 (three) times daily as needed for muscle spasms. 30 tablet 0  . DULoxetine (CYMBALTA) 30 MG capsule Take 30 mg by mouth 3 (three) times daily.    Marland Kitchen estradiol (ESTRACE) 0.1 MG/GM vaginal cream 1 gram vaginally twice weekly 42.5 g 1  . famotidine (PEPCID) 20 MG tablet Take 20 mg by mouth 2 (two) times daily.    . fluticasone (FLONASE) 50 MCG/ACT nasal spray Place 2 sprays into both nostrils daily.     . irbesartan (AVAPRO) 150 MG tablet Take 1 tablet (150 mg total) by mouth daily. 90 tablet 2   . levothyroxine (SYNTHROID) 112 MCG tablet Take 112 mcg by mouth daily before breakfast.    . Magnesium 400 MG CAPS Take 400 mg by mouth daily.    . metoprolol tartrate (LOPRESSOR) 25 MG tablet TAKE 1/2 TABLET BY MOUTH TWICE A DAY WITH FOOD 90 tablet 3  . nitroGLYCERIN (NITROSTAT) 0.4 MG SL tablet Place 1 tablet (0.4 mg total) under the tongue every 5 (five) minutes as needed. For chest pain 25 tablet 3  . omega-3 acid ethyl esters (LOVAZA) 1 G capsule Take 2 g by mouth 2 (two) times daily.      Marland Kitchen oxybutynin (DITROPAN-XL) 10 MG 24 hr tablet Take 10 mg by mouth daily.  11  . polyethylene glycol (MIRALAX / GLYCOLAX) packet Take 17 g by mouth 2 (two) times daily. 14 each 0  . rosuvastatin (CRESTOR) 20 MG tablet TAKE ONE TABLET BY MOUTH DAILY 90 tablet 3  . Wheat Dextrin (BENEFIBER) POWD Take 1 Scoop by mouth 3 (three) times daily. As needed     No current facility-administered medications for this visit.    Allergies as of 07/19/2020 - Review Complete 02/22/2020  Allergen Reaction Noted  . Latex  05/04/2010  . Lisinopril Cough 06/19/2010  . Tramadol hcl Nausea And Vomiting 05/04/2010    Family History  Problem Relation Age of Onset  . Colon cancer Mother 98  . Stroke Mother   . Hypertension Mother   . Diabetes Mother   . Rheum arthritis Mother   . Atrial fibrillation Mother   . Heart attack Father        x2  . Diabetes Father   . Kidney disease Father   . Heart disease Father   . Stroke Maternal Grandmother   . Colon cancer Paternal Grandmother   . Diabetes Paternal Grandmother   . Esophageal cancer Cousin 61    Social History   Socioeconomic History  . Marital status: Divorced    Spouse name: Not on file  . Number of children: 3  . Years of education: Not on file  . Highest education level: Not on file  Occupational History  . Occupation: Programmer, multimedia: [employer]  Tobacco Use  . Smoking status: Never Smoker  . Smokeless tobacco: Never Used  Vaping Use  .  Vaping Use: Never used  Substance and Sexual Activity  . Alcohol use: Yes    Alcohol/week: 1.0 standard drink    Types: 1 Glasses of wine per week    Comment: occ wine  . Drug use: No  . Sexual activity: Not Currently  Birth control/protection: Post-menopausal    Comment: BTL 07/03/89  Other Topics Concern  . Not on file  Social History Narrative  . Not on file   Social Determinants of Health   Financial Resource Strain: Not on file  Food Insecurity: Not on file  Transportation Needs: Not on file  Physical Activity: Not on file  Stress: Not on file  Social Connections: Not on file  Intimate Partner Violence: Not on file    Review of Systems:    Constitutional: No weight loss, fever or chills Skin: No rash  Cardiovascular: No chest pain  Respiratory: No SOB Gastrointestinal: See HPI and otherwise negative Genitourinary: No dysuria  Neurological: No headache, dizziness or syncope Musculoskeletal: No new muscle or joint pain Hematologic: No bleeding  Psychiatric: +depression   Physical Exam:  Vital signs: BP 122/74   Pulse 67   Ht 5' 5.5" (1.664 m)   Wt 180 lb (81.6 kg)   LMP 06/29/2006 (Approximate)   BMI 29.50 kg/m   Constitutional:   Pleasant overweight Caucasian female appears to be in NAD, Well developed, Well nourished, alert and cooperative Head:  Normocephalic and atraumatic. Eyes:   PEERL, EOMI. No icterus. Conjunctiva pink. Ears:  Normal auditory acuity. Neck:  Supple Throat: Oral cavity and pharynx without inflammation, swelling or lesion.  Respiratory: Respirations even and unlabored. Lungs clear to auscultation bilaterally.   No wheezes, crackles, or rhonchi.  Cardiovascular: Normal S1, S2. No MRG. Regular rate and rhythm. No peripheral edema, cyanosis or pallor.  Gastrointestinal:  Soft, nondistended, nontender. No rebound or guarding. Normal bowel sounds. No appreciable masses or hepatomegaly. Rectal:  Not performed.  Msk:  Symmetrical without  gross deformities. Without edema, no deformity or joint abnormality.  Neurologic:  Alert and  oriented x4;  grossly normal neurologically.  Skin:   Dry and intact without significant lesions or rashes. Psychiatric:  Demonstrates good judgement and reason without abnormal affect or behaviors.  No recent labs or imaging.  Assessment: 1.  History of colon cancer in her mother at the age of 73: Patient's last colonoscopy 5 years ago 2.  Chronic anticoagulation for CAD status post stent: On Plavix  Plan: 1.  Scheduled patient for surveillance colonoscopy with Dr. Silverio Decamp in the [facility].  Did provide the patient with a detailed list of risks for the procedure and she agrees to proceed. 2.  Patient is advised to hold her Plavix for 5 days prior to time of procedure.  We will communicate with her cardiologist to ensure that this acceptable for her. 3.  Patient to follow in clinic per recommendations from Dr. Silverio Decamp after time of procedure.  Ellouise Newer, PA-C [facility] 07/19/2020, 9:56 AM  Cc: Shon Baton, MD

## 2020-07-19 NOTE — Patient Instructions (Signed)
If you are age 66 or older, your body mass index should be between 23-30. Your Body mass index is 29.5 kg/m. If this is out of the aforementioned range listed, please consider follow up with your Primary Care Provider.  If you are age 2 or younger, your body mass index should be between 19-25. Your Body mass index is 29.5 kg/m. If this is out of the aformentioned range listed, please consider follow up with your Primary Care Provider.   You have been scheduled for a colonoscopy. Please follow written instructions given to you at your visit today.  Please pick up your prep supplies at the pharmacy within the next 1-3 days. If you use inhalers (even only as needed), please bring them with you on the day of your procedure.  Thank you for choosing me and [facility].  Ellouise Newer, PA-C

## 2020-07-19 NOTE — Telephone Encounter (Signed)
[facility] Pre-operative Risk Assessment     Request for surgical clearance:     Endoscopy Procedure  What type of surgery is being performed?     Colonoscopy   When is this surgery scheduled?     10/05/2020  What type of clearance is required ?   Pharmacy  Are there any medications that need to be held prior to surgery and how long? Plavix 5 days   Practice name and name of physician performing surgery?      [facility]  What is your office phone and fax number?      Phone- [phone]  [phone]  Anesthesia type (None, local, MAC, general) ?       MAC

## 2020-07-25 NOTE — Telephone Encounter (Signed)
Called patient and let her know to hold plavix 5 days prior to procedure. Patient stated that she understood.Patient also wanted to confirm that she was still scheduled for 10/05/20 @ 10:30am arriving at 9:30am.

## 2020-08-04 ENCOUNTER — Other Ambulatory Visit: Payer: Self-pay | Admitting: Cardiovascular Disease

## 2020-08-04 DIAGNOSIS — I2510 Atherosclerotic heart disease of native coronary artery without angina pectoris: Secondary | ICD-10-CM

## 2020-08-04 DIAGNOSIS — R0789 Other chest pain: Secondary | ICD-10-CM

## 2020-08-04 DIAGNOSIS — Z96641 Presence of right artificial hip joint: Secondary | ICD-10-CM

## 2020-08-20 NOTE — Progress Notes (Signed)
Reviewed and agree with documentation and assessment and plan. K. Veena Saphyra Hutt , MD   

## 2020-08-21 ENCOUNTER — Encounter: Payer: Self-pay | Admitting: Physician Assistant

## 2020-08-21 NOTE — Progress Notes (Addendum)
Cardiology Office Note:    Date:  08/22/2020   ID:  Sarah Ballard, DOB 12-17-1955, MRN [id number]  PCP:  Shon Baton, MD   [place]  Cardiologist:  Mertie Moores, MD   Electrophysiologist:  None       Referring MD: Shon Baton, MD   Chief Complaint:  Follow-up (CAD)    Patient Profile:    Sarah Ballard is a 65 y.o. female with:   Coronary artery disease   S/p Taxus DES to pLAD in 2005  S/p cutting POBA to LAD 2/2 ISR in 2009   Cath 06/2011: LAD stent patent  Hypertension   Hyperlipidemia   Hypothyroidism   Anxiety   GERD  Prior CV studies: Cardiac catheterization 07-03-11 LM dist 20 LAD ostial 40, prox stent patent w 20 ISR in dist segment of stent; Dx (small to mod) occluded and fills by collats LCx ostial 40 RCA mild irregs EF 50-55  History of Present Illness: Ms. Sarah Ballard was last seen by Dr. Acie Fredrickson in 10/21.  She returns for f/u.  She is here alone.  I saw her dad a couple years ago.  He has since passed away.  She shared with me that he enjoyed his visit with me.  Unfortunately, her son passed away a few hours after her father passed away.  This is still very hard for her.  From a cardiac standpoint, she is not having chest pain, significant shortness of breath, syncope, leg edema.          Past Medical History:  Diagnosis Date  . Angina pectoris ([place]) 2005   with stent placement Anterior decscending  . Anxiety   . Arthritis   . Bone spur    Right Hip  . CAD (coronary artery disease) 2005   s/p PCI LAD and cutting balloon angioplasty  with stent reexpansion 04/2008  . Colon polyps   . Degenerative disc disease   . Depression   . GERD (gastroesophageal reflux disease)   . Hypercholesterolemia   . Hyperlipemia   . Hypertension   . Hypothyroidism   . MI (myocardial infarction) ([place]) 2005   angio with stent  . Positive PPD    neg. CXR, treated with INH for 1 yr.  about 35  . UTI (urinary tract infection) 05/31/15     Current Medications: Current Meds  Medication Sig  . ALPRAZolam (XANAX) 0.5 MG tablet Take 0.5 mg by mouth 2 (two) times daily.   Marland Kitchen amitriptyline (ELAVIL) 10 MG tablet Take 20 mg by mouth at bedtime.  . betamethasone valerate ointment (VALISONE) 0.1 % Apply a pea sized amount topically BID for 1-2 weeks as needed. Not for daily long term use.  . busPIRone (BUSPAR) 5 MG tablet Take 5 mg by mouth 2 (two) times daily.   . calcium carbonate (TUMS - DOSED IN MG ELEMENTAL CALCIUM) 500 MG chewable tablet Chew 1-2 tablets by mouth 3 (three) times daily as needed for indigestion or heartburn.  . Calcium Carbonate-Vitamin D 600-400 MG-UNIT tablet Take 1 tablet by mouth daily.  . clopidogrel (PLAVIX) 75 MG tablet TAKE ONE TABLET BY MOUTH DAILY  . cyclobenzaprine (FLEXERIL) 10 MG tablet Take 1 tablet (10 mg total) by mouth 3 (three) times daily as needed for muscle spasms.  . DULoxetine (CYMBALTA) 30 MG capsule Take 30 mg by mouth 3 (three) times daily.  Marland Kitchen estradiol (ESTRACE) 0.1 MG/GM vaginal cream 1 gram vaginally twice weekly  . famotidine (PEPCID) 20 MG tablet  Take 20 mg by mouth 2 (two) times daily.  . fluticasone (FLONASE) 50 MCG/ACT nasal spray Place 2 sprays into both nostrils daily.  . irbesartan (AVAPRO) 150 MG tablet Take 1 tablet (150 mg total) by mouth daily.  Marland Kitchen levothyroxine (SYNTHROID) 100 MCG tablet Take 1 tablet by mouth daily.  . Magnesium 400 MG CAPS Take 400 mg by mouth daily.  . metoprolol tartrate (LOPRESSOR) 25 MG tablet TAKE 1/2 TABLET BY MOUTH TWICE A DAY WITH FOOD  . nitroGLYCERIN (NITROSTAT) 0.4 MG SL tablet Place 1 tablet (0.4 mg total) under the tongue every 5 (five) minutes as needed. For chest pain  . omega-3 acid ethyl esters (LOVAZA) 1 G capsule Take 2 g by mouth 2 (two) times daily.  Marland Kitchen oxybutynin (DITROPAN-XL) 10 MG 24 hr tablet Take 10 mg by mouth daily.  . pantoprazole (PROTONIX) 40 MG tablet Take one tablet by mouth ( 40 mg) as needed for Heartburn, gerd, reflux.   . polyethylene glycol (MIRALAX / GLYCOLAX) packet Take 17 g by mouth 2 (two) times daily.  . rosuvastatin (CRESTOR) 20 MG tablet TAKE ONE TABLET BY MOUTH DAILY  . Wheat Dextrin (BENEFIBER) POWD Take 1 Scoop by mouth 3 (three) times daily. As needed     Allergies:   Latex, Lisinopril, and Tramadol hcl   Social History   Tobacco Use  . Smoking status: Never Smoker  . Smokeless tobacco: Never Used  Vaping Use  . Vaping Use: Never used  Substance Use Topics  . Alcohol use: Yes    Alcohol/week: 1.0 standard drink    Types: 1 Glasses of wine per week    Comment: occ wine  . Drug use: No     Family Hx: The patient's family history includes Atrial fibrillation in her mother; Colon cancer in her paternal grandmother; Colon cancer (age of onset: 9) in her mother; Diabetes in her father, mother, and paternal grandmother; Esophageal cancer (age of onset: 73) in her cousin; Heart attack in her father; Heart disease in her father; Hypertension in her mother; Kidney disease in her father; Rheum arthritis in her mother; Stroke in her maternal grandmother and mother.  ROS   EKGs/Labs/Other Test Reviewed:    EKG:  EKG is   ordered today.  The ekg ordered today demonstrates normal sinus rhythm, HR 76, normal axis, septal Q waves, low voltage, QTc 450 ms, no ST-TW changes, no change from prior tracing.   Recent Labs: No results found for requested labs within last 8760 hours.   Recent Lipid Panel Lab Results  Component Value Date/Time   CHOL 146 08/21/2019 03:21 PM   TRIG 58 08/21/2019 03:21 PM   HDL 65 08/21/2019 03:21 PM   CHOLHDL 2.2 08/21/2019 03:21 PM   CHOLHDL 2.1 11/17/2015 09:20 AM   LDLCALC 69 08/21/2019 03:21 PM      Risk Assessment/Calculations:      Physical Exam:    VS:  BP 120/78   Pulse 76   Ht '5\' 5"'  (1.651 m)   Wt 182 lb (82.6 kg)   LMP 06/29/2006 (Approximate)   SpO2 96%   BMI 30.29 kg/m     Wt Readings from Last 3 Encounters:  08/22/20 182 lb (82.6 kg)   07/19/20 180 lb (81.6 kg)  02/22/20 172 lb 9.6 oz (78.3 kg)     Constitutional:      Appearance: Healthy appearance. Not in distress.  Neck:     Vascular: JVD normal.  Pulmonary:     Effort: Pulmonary  effort is normal.     Breath sounds: No wheezing. No rales.  Cardiovascular:     Normal rate. Regular rhythm. Normal S1. Normal S2.     Murmurs: There is no murmur.  Edema:    Peripheral edema absent.  Abdominal:     Palpations: Abdomen is soft. There is no hepatomegaly.  Skin:    General: Skin is warm and dry.  Neurological:     Mental Status: Alert and oriented to person, place and time.     Cranial Nerves: Cranial nerves are intact.          ASSESSMENT & PLAN:    1. Coronary artery disease involving native coronary artery of native heart without angina pectoris History of Taxus DES to the LAD in 2005 and balloon angioplasty to the LAD secondary to in-stent restenosis in 2009.  Cardiac catheterization 2013 demonstrated patent LAD stent.  She is doing well without anginal symptoms.  She remains on single antiplatelet therapy with clopidogrel.  Continue clopidogrel, metoprolol tartrate, rosuvastatin.  Follow-up in 6 months.  2. Essential hypertension The patient's blood pressure is controlled on her current regimen.  Continue current therapy.   3. Mixed hyperlipidemia Most recent LDL 95.  We discussed the importance of keeping her LDL <70.  Repeat CMET, fasting lipids today.  If her LDL is still >70, increase rosuvastatin to 40 mg daily.   4. GERD She was taken off of omeprazole in the past due to interactions with clopidogrel.  She has fairly decent response to famotidine.  There are times where she has more acid reflux.  I will give her a prescription for pantoprazole 40 mg to take once daily as needed for acid reflux.      Dispo:  Return in about 6 months (around 02/21/2021) for Routine 6 month follow up with Dr. Acie Fredrickson..   Medication Adjustments/Labs and Tests  Ordered: Current medicines are reviewed at length with the patient today.  Concerns regarding medicines are outlined above.  Tests Ordered: Orders Placed This Encounter  Procedures  . Comp Met (CMET)  . Lipid Profile  . EKG 12-Lead   Medication Changes: Meds ordered this encounter  Medications  . pantoprazole (PROTONIX) 40 MG tablet    Sig: Take one tablet by mouth ( 40 mg) as needed for Heartburn, gerd, reflux.    Dispense:  30 tablet    Refill:  3    Signed, Richardson Dopp, PA-C  08/22/2020 1:31 PM    [facility] [place], [place], [place]  [address] Phone: [phone]; Fax: [phone]

## 2020-08-22 ENCOUNTER — Other Ambulatory Visit: Payer: Self-pay

## 2020-08-22 ENCOUNTER — Ambulatory Visit: Payer: PPO | Admitting: Physician Assistant

## 2020-08-22 ENCOUNTER — Encounter: Payer: Self-pay | Admitting: Physician Assistant

## 2020-08-22 VITALS — BP 120/78 | HR 76 | Ht 65.0 in | Wt 182.0 lb

## 2020-08-22 DIAGNOSIS — E782 Mixed hyperlipidemia: Secondary | ICD-10-CM | POA: Diagnosis not present

## 2020-08-22 DIAGNOSIS — K219 Gastro-esophageal reflux disease without esophagitis: Secondary | ICD-10-CM | POA: Diagnosis not present

## 2020-08-22 DIAGNOSIS — I10 Essential (primary) hypertension: Secondary | ICD-10-CM

## 2020-08-22 DIAGNOSIS — I2510 Atherosclerotic heart disease of native coronary artery without angina pectoris: Secondary | ICD-10-CM

## 2020-08-22 MED ORDER — PANTOPRAZOLE SODIUM 40 MG PO TBEC: DELAYED_RELEASE_TABLET | ORAL | 3 refills | Status: DC

## 2020-08-22 NOTE — Patient Instructions (Signed)
Medication Instructions:  Your physician has recommended you make the following change in your medication:   1.  Start Protonix one tablet by mouth ( 40 mg) as needed for heartburn, gerd, reflux.   *If you need a refill on your cardiac medications before your next appointment, please call your pharmacy*   Lab Work: Your physician recommends that you have lab work today: CMET/LIPID.  If you have labs (blood work) drawn today and your tests are completely normal, you will receive your results only by: Marland Kitchen MyChart Message (if you have MyChart) OR . A paper copy in the mail If you have any lab test that is abnormal or we need to change your treatment, we will call you to review the results.   Testing/Procedures: -None   Follow-Up: At [facility], you and your health needs are our priority.  As part of our continuing mission to provide you with exceptional heart care, we have created designated Provider Care Teams.  These Care Teams include your primary Cardiologist (physician) and Advanced Practice Providers (APPs -  Physician Assistants and Nurse Practitioners) who all work together to provide you with the care you need, when you need it.  We recommend signing up for the patient portal called "MyChart".  Sign up information is provided on this After Visit Summary.  MyChart is used to connect with patients for Virtual Visits (Telemedicine).  Patients are able to view lab/test results, encounter notes, upcoming appointments, etc.  Non-urgent messages can be sent to your provider as well.   To learn more about what you can do with MyChart, go to [email or web address].    Your next appointment:   6 month(s)  The format for your next appointment:   In Person  Provider:   Mertie Moores, MD   Other Instructions Your physician wants you to follow-up in: 6 months with Dr.Nahser.  You will receive a reminder letter in the mail two months in advance. If you don't receive a letter, please  call our office to schedule the follow-up appointment.

## 2020-08-23 ENCOUNTER — Other Ambulatory Visit: Payer: Self-pay | Admitting: *Deleted

## 2020-08-23 DIAGNOSIS — I10 Essential (primary) hypertension: Secondary | ICD-10-CM

## 2020-08-23 DIAGNOSIS — I2510 Atherosclerotic heart disease of native coronary artery without angina pectoris: Secondary | ICD-10-CM

## 2020-08-23 DIAGNOSIS — E782 Mixed hyperlipidemia: Secondary | ICD-10-CM

## 2020-08-23 LAB — COMPREHENSIVE METABOLIC PANEL
ALT: 43 IU/L — ABNORMAL HIGH (ref 0–32)
AST: 39 IU/L (ref 0–40)
Albumin/Globulin Ratio: 2.2 (ref 1.2–2.2)
Albumin: 4.4 g/dL (ref 3.8–4.8)
Alkaline Phosphatase: 91 IU/L (ref 44–121)
BUN/Creatinine Ratio: 16 (ref 12–28)
BUN: 11 mg/dL (ref 8–27)
Bilirubin Total: 0.6 mg/dL (ref 0.0–1.2)
CO2: 20 mmol/L (ref 20–29)
Calcium: 9.4 mg/dL (ref 8.7–10.3)
Chloride: 101 mmol/L (ref 96–106)
Creatinine, Ser: 0.67 mg/dL (ref 0.57–1.00)
Globulin, Total: 2 g/dL (ref 1.5–4.5)
Glucose: 94 mg/dL (ref 65–99)
Potassium: 4.2 mmol/L (ref 3.5–5.2)
Sodium: 140 mmol/L (ref 134–144)
Total Protein: 6.4 g/dL (ref 6.0–8.5)
eGFR: 96 mL/min/{1.73_m2} (ref >59)

## 2020-08-23 LAB — LIPID PANEL
Chol/HDL Ratio: 2.6 ratio (ref 0.0–4.4)
Cholesterol, Total: 153 mg/dL (ref 100–199)
HDL: 60 mg/dL (ref >39)
LDL Chol Calc (NIH): 72 mg/dL (ref 0–99)
Triglycerides: 119 mg/dL (ref 0–149)
VLDL Cholesterol Cal: 21 mg/dL (ref 5–40)

## 2020-08-23 MED ORDER — ROSUVASTATIN CALCIUM 40 MG PO TABS: 40.0000 mg | ORAL_TABLET | Freq: Every day | ORAL | 3 refills | Status: DC

## 2020-08-29 ENCOUNTER — Other Ambulatory Visit: Payer: Self-pay | Admitting: Cardiovascular Disease

## 2020-09-03 ENCOUNTER — Other Ambulatory Visit: Payer: Self-pay | Admitting: Cardiovascular Disease

## 2020-09-15 ENCOUNTER — Ambulatory Visit: Payer: PPO | Admitting: Gastroenterology

## 2020-09-19 ENCOUNTER — Ambulatory Visit: Payer: PPO | Attending: Internal Medicine

## 2020-09-19 ENCOUNTER — Other Ambulatory Visit (HOSPITAL_BASED_OUTPATIENT_CLINIC_OR_DEPARTMENT_OTHER): Payer: Self-pay

## 2020-09-19 ENCOUNTER — Other Ambulatory Visit: Payer: Self-pay

## 2020-09-19 DIAGNOSIS — Z23 Encounter for immunization: Secondary | ICD-10-CM

## 2020-09-19 MED ORDER — PFIZER-BIONT COVID-19 VAC-TRIS 30 MCG/0.3ML IM SUSP
INTRAMUSCULAR | 0 refills | Status: AC
  Filled 2020-09-19: qty 0.3, 1d supply, fill #0

## 2020-09-19 NOTE — Progress Notes (Signed)
   Covid-19 Vaccination Clinic  Name:  Sarah Ballard    MRN: [id number] DOB: 1955/06/07  09/19/2020  Sarah Ballard was observed post Covid-19 immunization for 15 minutes without incident. She was provided with Vaccine Information Sheet and instruction to access the V-Safe system.   Sarah Ballard was instructed to call 911 with any severe reactions post vaccine: Marland Kitchen Difficulty breathing  . Swelling of face and throat  . A fast heartbeat  . A bad rash all over body  . Dizziness and weakness   Immunizations Administered    Name Date Dose VIS Date Route   PFIZER Comrnaty(Gray TOP) Covid-19 Vaccine 09/19/2020 12:33 PM 0.3 mL 04/07/2020 Intramuscular   Manufacturer: [place]   Lot: [id number]   NDC: [phone]

## 2020-10-04 ENCOUNTER — Encounter: Payer: Self-pay | Admitting: Certified Registered Nurse Anesthetist

## 2020-10-05 ENCOUNTER — Other Ambulatory Visit: Payer: Self-pay

## 2020-10-05 ENCOUNTER — Encounter: Payer: Self-pay | Admitting: Gastroenterology

## 2020-10-05 ENCOUNTER — Ambulatory Visit (AMBULATORY_SURGERY_CENTER): Payer: PPO | Admitting: Gastroenterology

## 2020-10-05 VITALS — BP 146/87 | HR 71 | Temp 96.9°F | Resp 16 | Ht 65.0 in | Wt 180.0 lb

## 2020-10-05 DIAGNOSIS — D128 Benign neoplasm of rectum: Secondary | ICD-10-CM

## 2020-10-05 DIAGNOSIS — I10 Essential (primary) hypertension: Secondary | ICD-10-CM | POA: Diagnosis not present

## 2020-10-05 DIAGNOSIS — Z800 Family history of malignant neoplasm of digestive organs: Secondary | ICD-10-CM | POA: Diagnosis not present

## 2020-10-05 DIAGNOSIS — Z1211 Encounter for screening for malignant neoplasm of colon: Secondary | ICD-10-CM

## 2020-10-05 DIAGNOSIS — K621 Rectal polyp: Secondary | ICD-10-CM

## 2020-10-05 DIAGNOSIS — I2510 Atherosclerotic heart disease of native coronary artery without angina pectoris: Secondary | ICD-10-CM | POA: Diagnosis not present

## 2020-10-05 MED ORDER — SODIUM CHLORIDE 0.9 % IV SOLN: 500.0000 mL | Freq: Once | INTRAVENOUS | Status: AC

## 2020-10-05 NOTE — Progress Notes (Signed)
Report given to PACU, vss 

## 2020-10-05 NOTE — Progress Notes (Signed)
Called to room to assist during endoscopic procedure.  Patient ID and intended procedure confirmed with present staff. Received instructions for my participation in the procedure from the performing physician.  

## 2020-10-05 NOTE — Op Note (Signed)
[place] Patient Name: Sarah Ballard Procedure Date: 10/05/2020 10:10 AM MRN: [id number] Endoscopist: Mauri Pole , MD Age: 65 Referring MD:  Date of Birth: [birth year] Gender: Female Account #: [id number] Procedure:                Colonoscopy Indications:              Screening in patient at increased risk: Family                            history of 1st-degree relative with colorectal                            cancer Medicines:                Monitored Anesthesia Care Procedure:                Pre-Anesthesia Assessment:                           - Prior to the procedure, a History and Physical                            was performed, and patient medications and                            allergies were reviewed. The patient's tolerance of                            previous anesthesia was also reviewed. The risks                            and benefits of the procedure and the sedation                            options and risks were discussed with the patient.                            All questions were answered, and informed consent                            was obtained. Prior Anticoagulants: The patient                            last took Plavix (clopidogrel) 5 days prior to the                            procedure. ASA Grade Assessment: III - A patient                            with severe systemic disease. After reviewing the                            risks and benefits, the patient was deemed in  satisfactory condition to undergo the procedure.                           After obtaining informed consent, the colonoscope                            was passed under direct vision. Throughout the                            procedure, the patient's blood pressure, pulse, and                            oxygen saturations were monitored continuously. The                            Olympus [id number]-[id number] (#[id number]) Colonoscope was                             introduced through the anus and advanced to the the                            cecum, identified by appendiceal orifice and                            ileocecal valve. The colonoscopy was performed                            without difficulty. The patient tolerated the                            procedure well. The quality of the bowel                            preparation was excellent. The ileocecal valve,                            appendiceal orifice, and rectum were photographed. Scope In: 10:28:59 AM Scope Out: 10:44:26 AM Scope Withdrawal Time: 0 hours 8 minutes 57 seconds  Total Procedure Duration: 0 hours 15 minutes 27 seconds  Findings:                 The perianal and digital rectal examinations were                            normal.                           Two sessile polyps were found in the rectum. The                            polyps were 1 to 2 mm in size. These polyps were                            removed with a cold biopsy forceps. Resection and  retrieval were complete.                           A few small-mouthed diverticula were found in the                            sigmoid colon.                           Non-bleeding internal hemorrhoids were found during                            retroflexion. The hemorrhoids were small. Complications:            No immediate complications. Estimated Blood Loss:     Estimated blood loss was minimal. Impression:               - Two 1 to 2 mm polyps in the rectum, removed with                            a cold biopsy forceps. Resected and retrieved.                           - Diverticulosis in the sigmoid colon.                           - Non-bleeding internal hemorrhoids. Recommendation:           - Patient has a contact number available for                            emergencies. The signs and symptoms of potential                            delayed complications were  discussed with the                            patient. Return to normal activities tomorrow.                            Written discharge instructions were provided to the                            patient.                           - Resume previous diet.                           - Await pathology results.                           - Continue present medications.                           - Repeat colonoscopy in 5 years for surveillance.                           -  Resume Plavix (clopidogrel) at prior dose                            tomorrow. Refer to managing physician for further                            adjustment of therapy. Mauri Pole, MD 10/05/2020 10:51:23 AM This report has been signed electronically.

## 2020-10-05 NOTE — Patient Instructions (Signed)
Handouts given:  Polyps, Diverticulosis, Hemorrhoids Resume previous diet Continue current medications Await pathology results Resume Plavix at prior dose tomorrow Repeat colonoscopy in 5 years  YOU HAD AN ENDOSCOPIC PROCEDURE TODAY AT [facility]:   Refer to the procedure report that was given to you for any specific questions about what was found during the examination.  If the procedure report does not answer your questions, please call your gastroenterologist to clarify.  If you requested that your care partner not be given the details of your procedure findings, then the procedure report has been included in a sealed envelope for you to review at your convenience later.  YOU SHOULD EXPECT: Some feelings of bloating in the abdomen. Passage of more gas than usual.  Walking can help get rid of the air that was put into your GI tract during the procedure and reduce the bloating. If you had a lower endoscopy (such as a colonoscopy or flexible sigmoidoscopy) you may notice spotting of blood in your stool or on the toilet paper. If you underwent a bowel prep for your procedure, you may not have a normal bowel movement for a few days.  Please Note:  You might notice some irritation and congestion in your nose or some drainage.  This is from the oxygen used during your procedure.  There is no need for concern and it should clear up in a day or so.  SYMPTOMS TO REPORT IMMEDIATELY:   Following lower endoscopy (colonoscopy or flexible sigmoidoscopy):  Excessive amounts of blood in the stool  Significant tenderness or worsening of abdominal pains  Swelling of the abdomen that is new, acute  Fever of 100F or higher  For urgent or emergent issues, a gastroenterologist can be reached at any hour by calling [phone]. Do not use MyChart messaging for urgent concerns.   DIET:  We do recommend a small meal at first, but then you may proceed to your regular diet.  Drink plenty of  fluids but you should avoid alcoholic beverages for 24 hours.  ACTIVITY:  You should plan to take it easy for the rest of today and you should NOT DRIVE or use heavy machinery until tomorrow (because of the sedation medicines used during the test).    FOLLOW UP: Our staff will call the number listed on your records 48-72 hours following your procedure to check on you and address any questions or concerns that you may have regarding the information given to you following your procedure. If we do not reach you, we will leave a message.  We will attempt to reach you two times.  During this call, we will ask if you have developed any symptoms of COVID 19. If you develop any symptoms (ie: fever, flu-like symptoms, shortness of breath, cough etc.) before then, please call [phone].  If you test positive for Covid 19 in the 2 weeks post procedure, please call and report this information to [place].    If any biopsies were taken you will be contacted by phone or by letter within the next 1-3 weeks.  Please call us at [phone] if you have not heard about the biopsies in 3 weeks.   SIGNATURES/CONFIDENTIALITY: You and/or your care partner have signed paperwork which will be entered into your electronic medical record.  These signatures attest to the fact that that the information above on your After Visit Summary has been reviewed and is understood.  Full responsibility of the confidentiality of this discharge information  lies with you and/or your care-partner.

## 2020-10-07 ENCOUNTER — Telehealth: Payer: Self-pay | Admitting: *Deleted

## 2020-10-07 ENCOUNTER — Telehealth: Payer: Self-pay

## 2020-10-07 NOTE — Telephone Encounter (Signed)
Patient returned your follow-up call after her procedure. She stated to be feeling fine and she had so many praises for all the staff who took care of her yesterday. In particular for Clay. She stated that he put her at easy right away and was great.

## 2020-10-07 NOTE — Telephone Encounter (Signed)
Attempted to reach pt. With follow-up call following endoscopic procedure 10/05/2020.  No answer, unable to leave voice mail.

## 2020-10-07 NOTE — Telephone Encounter (Signed)
Attempted f/u phone call. No answer. Left message. °

## 2020-10-18 ENCOUNTER — Encounter: Payer: Self-pay | Admitting: Gastroenterology

## 2020-11-09 ENCOUNTER — Other Ambulatory Visit: Payer: PPO

## 2020-11-10 ENCOUNTER — Other Ambulatory Visit: Payer: Self-pay

## 2020-11-10 ENCOUNTER — Other Ambulatory Visit: Payer: Self-pay | Admitting: Physician Assistant

## 2020-11-10 ENCOUNTER — Other Ambulatory Visit: Payer: PPO

## 2020-11-10 DIAGNOSIS — E782 Mixed hyperlipidemia: Secondary | ICD-10-CM

## 2020-11-10 DIAGNOSIS — I2510 Atherosclerotic heart disease of native coronary artery without angina pectoris: Secondary | ICD-10-CM | POA: Diagnosis not present

## 2020-11-10 DIAGNOSIS — I10 Essential (primary) hypertension: Secondary | ICD-10-CM

## 2020-11-10 LAB — HEPATIC FUNCTION PANEL
ALT: 24 IU/L (ref 0–32)
AST: 23 IU/L (ref 0–40)
Albumin: 4.3 g/dL (ref 3.8–4.8)
Alkaline Phosphatase: 89 IU/L (ref 44–121)
Bilirubin Total: 0.5 mg/dL (ref 0.0–1.2)
Bilirubin, Direct: 0.16 mg/dL (ref 0.00–0.40)
Total Protein: 6.1 g/dL (ref 6.0–8.5)

## 2020-11-10 LAB — LIPID PANEL
Chol/HDL Ratio: 2.2 ratio (ref 0.0–4.4)
Cholesterol, Total: 126 mg/dL (ref 100–199)
HDL: 58 mg/dL (ref >39)
LDL Chol Calc (NIH): 56 mg/dL (ref 0–99)
Triglycerides: 56 mg/dL (ref 0–149)
VLDL Cholesterol Cal: 12 mg/dL (ref 5–40)

## 2020-12-30 DIAGNOSIS — E785 Hyperlipidemia, unspecified: Secondary | ICD-10-CM | POA: Diagnosis not present

## 2020-12-30 DIAGNOSIS — E039 Hypothyroidism, unspecified: Secondary | ICD-10-CM | POA: Diagnosis not present

## 2021-01-04 DIAGNOSIS — M25561 Pain in right knee: Secondary | ICD-10-CM | POA: Diagnosis not present

## 2021-01-04 DIAGNOSIS — M170 Bilateral primary osteoarthritis of knee: Secondary | ICD-10-CM | POA: Diagnosis not present

## 2021-01-04 DIAGNOSIS — M25562 Pain in left knee: Secondary | ICD-10-CM | POA: Diagnosis not present

## 2021-01-09 DIAGNOSIS — D692 Other nonthrombocytopenic purpura: Secondary | ICD-10-CM | POA: Diagnosis not present

## 2021-01-09 DIAGNOSIS — M1990 Unspecified osteoarthritis, unspecified site: Secondary | ICD-10-CM | POA: Diagnosis not present

## 2021-01-09 DIAGNOSIS — R82998 Other abnormal findings in urine: Secondary | ICD-10-CM | POA: Diagnosis not present

## 2021-01-09 DIAGNOSIS — R739 Hyperglycemia, unspecified: Secondary | ICD-10-CM | POA: Diagnosis not present

## 2021-01-09 DIAGNOSIS — E785 Hyperlipidemia, unspecified: Secondary | ICD-10-CM | POA: Diagnosis not present

## 2021-01-09 DIAGNOSIS — E039 Hypothyroidism, unspecified: Secondary | ICD-10-CM | POA: Diagnosis not present

## 2021-01-09 DIAGNOSIS — E669 Obesity, unspecified: Secondary | ICD-10-CM | POA: Diagnosis not present

## 2021-01-09 DIAGNOSIS — F329 Major depressive disorder, single episode, unspecified: Secondary | ICD-10-CM | POA: Diagnosis not present

## 2021-01-09 DIAGNOSIS — I119 Hypertensive heart disease without heart failure: Secondary | ICD-10-CM | POA: Diagnosis not present

## 2021-01-09 DIAGNOSIS — Z23 Encounter for immunization: Secondary | ICD-10-CM | POA: Diagnosis not present

## 2021-01-09 DIAGNOSIS — I2510 Atherosclerotic heart disease of native coronary artery without angina pectoris: Secondary | ICD-10-CM | POA: Diagnosis not present

## 2021-01-09 DIAGNOSIS — F4310 Post-traumatic stress disorder, unspecified: Secondary | ICD-10-CM | POA: Diagnosis not present

## 2021-01-09 DIAGNOSIS — Z0000 Encounter for general adult medical examination without abnormal findings: Secondary | ICD-10-CM | POA: Diagnosis not present

## 2021-01-14 ENCOUNTER — Other Ambulatory Visit: Payer: Self-pay | Admitting: Cardiovascular Disease

## 2021-01-18 ENCOUNTER — Other Ambulatory Visit (HOSPITAL_COMMUNITY): Payer: Self-pay

## 2021-01-20 ENCOUNTER — Other Ambulatory Visit: Payer: Self-pay | Admitting: Cardiovascular Disease

## 2021-01-27 DIAGNOSIS — M8589 Other specified disorders of bone density and structure, multiple sites: Secondary | ICD-10-CM | POA: Diagnosis not present

## 2021-02-10 ENCOUNTER — Ambulatory Visit: Payer: PPO | Attending: Internal Medicine

## 2021-02-10 ENCOUNTER — Other Ambulatory Visit (HOSPITAL_BASED_OUTPATIENT_CLINIC_OR_DEPARTMENT_OTHER): Payer: Self-pay

## 2021-02-10 DIAGNOSIS — Z23 Encounter for immunization: Secondary | ICD-10-CM

## 2021-02-10 MED ORDER — PFIZER COVID-19 VAC BIVALENT 30 MCG/0.3ML IM SUSP
INTRAMUSCULAR | 0 refills | Status: AC
  Filled 2021-02-10: qty 0.3, 1d supply, fill #0

## 2021-02-10 NOTE — Progress Notes (Signed)
   Covid-19 Vaccination Clinic  Name:  Sarah Ballard    MRN: [id number] DOB: [birth year]  02/10/2021  [name] was observed post Covid-19 immunization for 15 minutes without incident. She was provided with Vaccine Information Sheet and instruction to access the V-Safe system.   [name] was instructed to call 911 with any severe reactions post vaccine: Difficulty breathing  Swelling of face and throat  A fast heartbeat  A bad rash all over body  Dizziness and weakness

## 2021-02-16 ENCOUNTER — Other Ambulatory Visit: Payer: Self-pay | Admitting: Cardiovascular Disease

## 2021-02-16 DIAGNOSIS — R0789 Other chest pain: Secondary | ICD-10-CM

## 2021-02-16 DIAGNOSIS — Z96641 Presence of right artificial hip joint: Secondary | ICD-10-CM

## 2021-02-16 DIAGNOSIS — I2510 Atherosclerotic heart disease of native coronary artery without angina pectoris: Secondary | ICD-10-CM

## 2021-02-27 ENCOUNTER — Encounter: Payer: Self-pay | Admitting: Cardiovascular Disease

## 2021-02-27 NOTE — Progress Notes (Signed)
Sarah Ballard Date of Birth  [birth year] [facility] [address]    [place], [place]  [address] [phone]  Fax  [phone]  Problem List 1. Coronary artery disease-  2. anxiety 3. Hyperlipidemia 4. Hypertension 5. Hypothyroidism 6. Chronic back pain     The 65 yo  female with a history of coronary artery disease. She's status post PTCA and stenting of her left anterior descending artery. She also had [place] Ball[place] procedure and reexpansion of that stent in January 2010.  She was admitted recently to the hospital for chest pain an dizziness and cath revealed no significant irregularities.  He is walking twice a week. She's not having episodes of angina with walking. 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  October 27, 2012:  Sarah Ballard is doing well from a cardiac standpoint.   She is having some back pain - needs to have a back injection but does not want to stop the Plavix.    October 27, 2013:  Sarah Ballard is doing well.  She denies any chest pain.  Bumping into lots of things and has bruising on her legs.   Her potassium was low at her last check.    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  Asked about stopping the plavix   Jan. 24, 2017:  Sarah Ballard is doing ok Has been losing weight.   Lots of stress with taking care of her father and her divorce and her son .  No CP .     November 17, 2015:  Doing ok.  Sleepy .  Had to work late last night . No CP,  Breathing is good Lots of anxiety with work stress.   July 12, 2016:  Doing ok from a cardiac standpoint Has gained some weight .  Rare episodes of CP - she thinks its due to stress or indigestion,   its not related to exertion  requests a refill of her NTG  Is trying to move  Is having lots of artheritis   Pain  Is falling lots at work   Nov. 14,  2018  Has  Moved since I last saw her. Is having some chest pain  Thinks its GERD or anxeity.  Lots of stress  Lots of stress related to the move.    The CP are atypical , do not feel like her previouis angina Takes NTG - does not really relieve the CP Finds [employer] work   Is planning on having bunion surgery at the end of this year Will be at low risk Will need to hold Plavix for 5 days prior to surgery .    Father has gained some fluid .   Feb. 27, 2019 Doing well Is very appreciative that I sent her to see Ezekiel Slocumb for her anxiety .  Father recently had the flu  Sarah Ballard has had a cough / cold.   Developed quickly. Has been seen by Dr. 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    December 26, 2017:  Sarah Ballard is seen today for follow-up visit. MRI of her hip shows that she needs to have a hip replacement Sees Dr. Alvan Dame . No angina    April, 23, 2021:  Sarah Ballard is doing well  Has some indigestion  Not relieved with  protonix.  , suggested pepcid complete in addition to her Protonix   No angina  Needs to exercise more.  Has gained some weight Has been getting short of breath while walking ,  I suspect most of this dyspnea is due to weight gain and general deconditioning .  No angina similar to her presenting symptoms of angina pain from years ago .   February 22, 2020: Sarah Ballard is doing well.  She is seen today for follow-up of her coronary artery disease and hyperlipidemia.  Her blood pressure and heart rate are normal. Had covid Sept. 22 .   Has had 2 Pfizer covid vaccines and now has had the booster (oct. 22) . Marland Kitchen Had chills, headache  vomitting , sneezing for a few days  Is still fatigued.   Nov. 1, 2022 Sarah Ballard is seen today for follow up of his CAD , HLD No cardiac issues,  no angina   Had bilateral knee injections with Dr. Alvan Dame Injections has helped quite a bit   Walks her dog daily .  Does some video exercises  I recommended a stationary bike    Continues to struggling with weight gain .   Cant keep consistent weight loss program    Current Outpatient Medications on File Prior to Visit  Medication Sig Dispense Refill   ALPRAZolam (XANAX) 0.5 MG tablet Take 0.5 mg by mouth 2 (two) times daily.      amitriptyline (ELAVIL) 10 MG tablet Take 20 mg by mouth at bedtime.     betamethasone valerate ointment (VALISONE) 0.1 % Apply a pea sized amount topically BID for 1-2 weeks as needed. Not for daily long term use. 15 g 0   busPIRone (BUSPAR) 5 MG tablet Take 5 mg by mouth 2 (two) times daily.      calcium carbonate (TUMS - DOSED IN MG ELEMENTAL CALCIUM) 500 MG chewable tablet Chew 1-2 tablets by mouth 3 (three) times daily as needed for indigestion or heartburn.     Calcium Carbonate-Vitamin D 600-400 MG-UNIT tablet Take 1 tablet by mouth daily.     clopidogrel (PLAVIX) 75 MG tablet TAKE ONE TABLET BY MOUTH DAILY 90 tablet 1   COVID-19 mRNA bivalent vaccine, Pfizer, (PFIZER COVID-19 VAC BIVALENT) injection Inject into the muscle. 0.3 mL 0   COVID-19 mRNA Vac-TriS, Pfizer, (PFIZER-BIONT COVID-19 VAC-TRIS) SUSP injection Inject into the muscle. 0.3 mL 0   cyclobenzaprine (FLEXERIL) 10 MG tablet Take 1 tablet (10 mg total) by mouth 3 (three) times daily as needed for muscle spasms. 30 tablet 0   DULoxetine (CYMBALTA) 30 MG capsule Take 30 mg by mouth 3 (three) times daily.     estradiol (ESTRACE) 0.1 MG/GM vaginal cream 1 gram vaginally twice weekly (Patient not taking: Reported on 10/05/2020) 42.5 g 1   famotidine (PEPCID) 20 MG tablet Take 20 mg by mouth 2 (two) times daily. (Patient not taking: Reported on 10/05/2020)     fluticasone (FLONASE) 50 MCG/ACT nasal spray Place 2 sprays into both nostrils daily.     irbesartan (AVAPRO) 150 MG tablet TAKE ONE TABLET BY MOUTH DAILY 90 tablet 3   levothyroxine (SYNTHROID) 100 MCG tablet Take 1 tablet by mouth daily.     Magnesium 400 MG CAPS Take 400 mg by mouth daily.     metoprolol tartrate  (LOPRESSOR) 25 MG tablet TAKE 1/2 TABLET BY MOUTH TWO TIMES A DAY WITH FOOD 90 tablet 2   nitroGLYCERIN (NITROSTAT) 0.4 MG SL tablet DISSOLVE 1 TAB UNDER TONGUE FOR CHEST  PAIN - IF PAIN REMAINS AFTER 5 MIN, CALL 911 AND REPEAT DOSE. MAX 3 TABS IN 15 MINUTES 25 tablet 3   omega-3 acid ethyl esters (LOVAZA) 1 G capsule Take 2 g by mouth 2 (two) times daily.     oxybutynin (DITROPAN-XL) 10 MG 24 hr tablet Take 10 mg by mouth daily.  11   pantoprazole (PROTONIX) 40 MG tablet TAKE ONE TABLET BY MOUTH DAILY AS NEEDED FOR HEARTBURN, GERD OR REFLUX 90 tablet 2   rosuvastatin (CRESTOR) 40 MG tablet Take 1 tablet (40 mg total) by mouth daily. 30 tablet 3   Wheat Dextrin (BENEFIBER) POWD Take 1 Scoop by mouth 3 (three) times daily. As needed     Current Facility-Administered Medications on File Prior to Visit  Medication Dose Route Frequency Provider Last Rate Last Admin   0.9 %  sodium chloride infusion  500 mL Intravenous Once Shon Baton, MD        Allergies  Allergen Reactions   Latex     itching   Lisinopril Cough   Tramadol Hcl Nausea And Vomiting    Past Medical History:  Diagnosis Date   Angina pectoris ([place]) 2005   with stent placement Anterior decscending   Anxiety    Arthritis    Bone spur    Right Hip   CAD (coronary artery disease) 2005   s/p PCI LAD and cutting balloon angioplasty  with stent reexpansion 04/2008   Colon polyps    Degenerative disc disease    Depression    GERD (gastroesophageal reflux disease)    Hypercholesterolemia    Hyperlipemia    Hypertension    Hypothyroidism    MI (myocardial infarction) ([place]) 2005   angio with stent   Positive PPD    neg. CXR, treated with INH for 1 yr.  about 1982   UTI (urinary tract infection) 05/31/15    Past Surgical History:  Procedure Laterality Date   BUNIONECTOMY     COLONOSCOPY     CORONARY ANGIOPLASTY  2009   CORONARY ANGIOPLASTY WITH STENT PLACEMENT  2005   HEMORRHOID SURGERY  06/2015   Removal     LAPAROSCOPY     for endometriosis   LEFT HEART CATHETERIZATION WITH CORONARY ANGIOGRAM Bilateral 06/27/2011   Procedure: LEFT HEART CATHETERIZATION WITH CORONARY ANGIOGRAM;  Surgeon: Burnell Blanks, MD;  Location[facility] C[facility] LAB;  Service: Cardiovascular;  Laterality: Bilateral;   LUMBAR EPIDURAL INJECTION     many   NERVE ROOT BLOCK     epidural steroids; multiple   POLYPECTOMY     ruptured disc  2007   with epidural    TONGUE BIOPSY     keratin accumulation - Benign    TONSILLECTOMY     TOTAL HIP ARTHROPLASTY Right 01/21/2018   Procedure: RIGHT TOTAL HIP ARTHROPLASTY ANTERIOR APPROACH;  Surgeon: Paralee Cancel, MD;  Location: [facility] ORS;  Service: Orthopedics;  Laterality: Right;  13mins   TUBAL LIGATION      Social History   Tobacco Use  Smoking Status Never  Smokeless Tobacco Never    Social History   Substance and Sexual Activity  Alcohol Use Yes   Alcohol/week: 1.0 standard drink   Types: 1 Glasses of wine per week   Comment: occ wine    Family History  Problem Relation Age of Onset   Colon cancer Mother 53   Stroke Mother    Hypertension Mother    Diabetes Mother    Rheum arthritis Mother  Atrial fibrillation Mother    Heart attack Father        x2   Diabetes Father    Kidney disease Father    Heart disease Father    Stroke Maternal Grandmother    Colon cancer Paternal Grandmother    Diabetes Paternal Grandmother    Esophageal cancer Cousin 70   Rectal cancer Neg Hx    Stomach cancer Neg Hx     Reviw of Systems:  Reviewed in the HPI.  All other systems are negative.  Physical Exam: Last menstrual period 06/29/2006.  GEN:  Well nourished, well developed in no acute distress HEENT: Normal NECK: No JVD; No carotid bruits LYMPHATICS: No lymphadenopathy CARDIAC: RRR , no murmurs, rubs, gallops RESPIRATORY:  Clear to auscultation without rales, wheezing or rhonchi  ABDOMEN: Soft, non-tender, non-distended MUSCULOSKELETAL:  No edema; No deformity   SKIN: Warm and dry NEUROLOGIC:  Alert and oriented x 3   ECG :    Assessment / Plan:   1. Coronary artery disease-    she is not having any episodes of chest pain or shortness of breath.   2. Anxiety -     She continues to struggle with anxiety.  3. Hyperlipidemia -       Her lipids been managed by Dr. Virgina Jock.  I have labs from July, 2022 which look great.  She had some labs from September which she said also look good.  She will have those faxed over to [place].  4. Hypertension  -    blood pressure is well controlled.  5. Hypothyroidism  6. Chronic back pain  7. Weight gain :   encouraged weight loss , carb reduction     .      Mertie Moores, MD  02/27/2021 7:32 PM    [place],  [place], [place]  [address] Pager [phone] Phone: [phone]; Fax: [phone]

## 2021-02-28 ENCOUNTER — Ambulatory Visit: Payer: PPO | Admitting: Cardiovascular Disease

## 2021-02-28 ENCOUNTER — Encounter: Payer: Self-pay | Admitting: Cardiovascular Disease

## 2021-02-28 ENCOUNTER — Other Ambulatory Visit: Payer: Self-pay

## 2021-02-28 VITALS — BP 122/72 | HR 75 | Ht 65.0 in | Wt 181.0 lb

## 2021-02-28 DIAGNOSIS — I2510 Atherosclerotic heart disease of native coronary artery without angina pectoris: Secondary | ICD-10-CM | POA: Diagnosis not present

## 2021-02-28 DIAGNOSIS — I10 Essential (primary) hypertension: Secondary | ICD-10-CM

## 2021-02-28 NOTE — Patient Instructions (Signed)

## 2021-03-27 ENCOUNTER — Other Ambulatory Visit: Payer: Self-pay | Admitting: Physician Assistant

## 2021-03-31 DIAGNOSIS — R350 Frequency of micturition: Secondary | ICD-10-CM | POA: Diagnosis not present

## 2021-03-31 DIAGNOSIS — N3946 Mixed incontinence: Secondary | ICD-10-CM | POA: Diagnosis not present

## 2021-04-07 DIAGNOSIS — Z1231 Encounter for screening mammogram for malignant neoplasm of breast: Secondary | ICD-10-CM | POA: Diagnosis not present

## 2021-05-25 DIAGNOSIS — M1831 Unilateral post-traumatic osteoarthritis of first carpometacarpal joint, right hand: Secondary | ICD-10-CM | POA: Diagnosis not present

## 2021-05-25 DIAGNOSIS — M720 Palmar fascial fibromatosis [Dupuytren]: Secondary | ICD-10-CM | POA: Diagnosis not present

## 2021-05-30 ENCOUNTER — Telehealth: Payer: Self-pay | Admitting: Gastroenterology

## 2021-05-30 NOTE — Telephone Encounter (Signed)
Patient returned call

## 2021-05-30 NOTE — Telephone Encounter (Signed)
Called the patient back. No answer. Left her a message on the voicemail.

## 2021-05-30 NOTE — Telephone Encounter (Signed)
Inbound call from patient states she have external hemorrhoids and would like a call back to discuss if she can have banding procedure because they are starting to become painful. Best contact number [phone]

## 2021-05-31 NOTE — Telephone Encounter (Signed)
We will have to obtain clearance from cardiology to hold Plavix for 5 days prior to the procedure, it may be best to see her first and follow-up visit to determine if hemorrhoidal banding is the best option or not.  Thank you

## 2021-05-31 NOTE — Telephone Encounter (Signed)
Spoke with patient. She denies any painful defecation, no bleeding. She has mild discomfort when cleaning. She can feel 2 small bulges at the anus. She is interested in banding in hopes of proactively managing the hemorrhoids.  She is on Plavix.

## 2021-06-01 NOTE — Telephone Encounter (Signed)
Discussed with the patient and scheduled her. She is in agreement with this plan.

## 2021-06-06 DIAGNOSIS — D2271 Melanocytic nevi of right lower limb, including hip: Secondary | ICD-10-CM | POA: Diagnosis not present

## 2021-06-06 DIAGNOSIS — L821 Other seborrheic keratosis: Secondary | ICD-10-CM | POA: Diagnosis not present

## 2021-06-06 DIAGNOSIS — D2272 Melanocytic nevi of left lower limb, including hip: Secondary | ICD-10-CM | POA: Diagnosis not present

## 2021-06-06 DIAGNOSIS — D692 Other nonthrombocytopenic purpura: Secondary | ICD-10-CM | POA: Diagnosis not present

## 2021-06-06 DIAGNOSIS — D225 Melanocytic nevi of trunk: Secondary | ICD-10-CM | POA: Diagnosis not present

## 2021-06-06 DIAGNOSIS — D2371 Other benign neoplasm of skin of right lower limb, including hip: Secondary | ICD-10-CM | POA: Diagnosis not present

## 2021-06-06 DIAGNOSIS — D1801 Hemangioma of skin and subcutaneous tissue: Secondary | ICD-10-CM | POA: Diagnosis not present

## 2021-06-06 DIAGNOSIS — L814 Other melanin hyperpigmentation: Secondary | ICD-10-CM | POA: Diagnosis not present

## 2021-06-06 DIAGNOSIS — L570 Actinic keratosis: Secondary | ICD-10-CM | POA: Diagnosis not present

## 2021-06-16 ENCOUNTER — Other Ambulatory Visit (HOSPITAL_BASED_OUTPATIENT_CLINIC_OR_DEPARTMENT_OTHER): Payer: Self-pay

## 2021-06-16 MED ORDER — ZOSTER VAC RECOMB ADJUVANTED 50 MCG/0.5ML IM SUSR
INTRAMUSCULAR | 0 refills | Status: AC
  Filled 2021-06-16: qty 0.5, 1d supply, fill #0

## 2021-06-20 ENCOUNTER — Telehealth: Payer: Self-pay

## 2021-06-20 NOTE — Telephone Encounter (Signed)
Spoke with the patient. She is okay with moving her appointment, but asks to be moved to another Friday and not on 06/23/21. Rescheduled to 07/28/21 at 11:20 am.

## 2021-06-20 NOTE — Telephone Encounter (Signed)
Need to move the patient to an appointment on Friday 06/23/21 at 11:20 am (to be created) if she can do this. Called the patient. No answer. Left her a message asking if she will call me or send me a message about this appointment.

## 2021-06-22 ENCOUNTER — Ambulatory Visit: Payer: PPO | Admitting: Gastroenterology

## 2021-06-26 ENCOUNTER — Other Ambulatory Visit: Payer: Self-pay | Admitting: *Deleted

## 2021-06-26 MED ORDER — ROSUVASTATIN CALCIUM 40 MG PO TABS: 40.0000 mg | ORAL_TABLET | Freq: Every day | ORAL | 3 refills | Status: DC

## 2021-07-07 DIAGNOSIS — I119 Hypertensive heart disease without heart failure: Secondary | ICD-10-CM | POA: Diagnosis not present

## 2021-07-07 DIAGNOSIS — M8580 Other specified disorders of bone density and structure, unspecified site: Secondary | ICD-10-CM | POA: Diagnosis not present

## 2021-07-07 DIAGNOSIS — E669 Obesity, unspecified: Secondary | ICD-10-CM | POA: Diagnosis not present

## 2021-07-07 DIAGNOSIS — E039 Hypothyroidism, unspecified: Secondary | ICD-10-CM | POA: Diagnosis not present

## 2021-07-07 DIAGNOSIS — F4310 Post-traumatic stress disorder, unspecified: Secondary | ICD-10-CM | POA: Diagnosis not present

## 2021-07-07 DIAGNOSIS — D692 Other nonthrombocytopenic purpura: Secondary | ICD-10-CM | POA: Diagnosis not present

## 2021-07-07 DIAGNOSIS — K5900 Constipation, unspecified: Secondary | ICD-10-CM | POA: Diagnosis not present

## 2021-07-07 DIAGNOSIS — I2510 Atherosclerotic heart disease of native coronary artery without angina pectoris: Secondary | ICD-10-CM | POA: Diagnosis not present

## 2021-07-07 DIAGNOSIS — F419 Anxiety disorder, unspecified: Secondary | ICD-10-CM | POA: Diagnosis not present

## 2021-07-07 DIAGNOSIS — M1990 Unspecified osteoarthritis, unspecified site: Secondary | ICD-10-CM | POA: Diagnosis not present

## 2021-07-07 DIAGNOSIS — R739 Hyperglycemia, unspecified: Secondary | ICD-10-CM | POA: Diagnosis not present

## 2021-07-07 DIAGNOSIS — E785 Hyperlipidemia, unspecified: Secondary | ICD-10-CM | POA: Diagnosis not present

## 2021-07-28 ENCOUNTER — Ambulatory Visit: Payer: PPO | Admitting: Gastroenterology

## 2021-07-28 ENCOUNTER — Encounter: Payer: Self-pay | Admitting: Gastroenterology

## 2021-07-28 VITALS — BP 104/70 | HR 81 | Ht 65.0 in | Wt 185.0 lb

## 2021-07-28 DIAGNOSIS — K582 Mixed irritable bowel syndrome: Secondary | ICD-10-CM

## 2021-07-28 DIAGNOSIS — K649 Unspecified hemorrhoids: Secondary | ICD-10-CM | POA: Diagnosis not present

## 2021-07-28 DIAGNOSIS — K219 Gastro-esophageal reflux disease without esophagitis: Secondary | ICD-10-CM | POA: Diagnosis not present

## 2021-07-28 MED ORDER — PANTOPRAZOLE SODIUM 40 MG PO TBEC: DELAYED_RELEASE_TABLET | ORAL | 3 refills | Status: DC

## 2021-07-28 NOTE — Patient Instructions (Signed)
Continue Benefiber ? ?Increase water intake to 8-10 cups per day ? ?We have sent the following medications to your pharmacy for you to pick up at your convenience:   Pantoprazole  ? ?If you are age 67 or older, your body mass index should be between 23-30. Your Body mass index is 30.79 kg/m?Marland Kitchen If this is out of the aforementioned range listed, please consider follow up with your Primary Care Provider. ? ?If you are age 3 or younger, your body mass index should be between 19-25. Your Body mass index is 30.79 kg/m?Marland Kitchen If this is out of the aformentioned range listed, please consider follow up with your Primary Care Provider.  ? ?________________________________________________________ ? ?The [facility] providers would like to encourage you to use [facility] to communicate with providers for non-urgent requests or questions.  Due to long hold times on the telephone, sending your provider a message by [facility] may be a faster and more efficient way to get a response.  Please allow 48 business hours for a response.  Please remember that this is for non-urgent requests.  ?_______________________________________________________  ? ?I appreciate the  opportunity to care for you ? ?Thank You  ? ?Harl Bowie , MD  ?

## 2021-07-28 NOTE — Progress Notes (Signed)
? ?       ? ?Sarah Ballard    [id number]    03-10-1956 ? ?Primary Care Physician:Russo, Jenny Reichmann, MD ? ?Referring Physician: Shon Baton, MD ?[address] ?[place],  [place] [address] ? ? ?Chief complaint: Hemorrhoids ? ?HPI: ?66 year old very pleasant female with history of chronic GERD and constipation for follow-up for hemorrhoids ? ?She is noticing excess skin and possible hemorrhoids near her anus, had mild discomfort.  She is having mucus discharge and also occasionally feels it is hard to maintain hygiene ? ?Family history or colon cancer ? ?Colonoscopy October 05, 2020 ?- Two 1 to 2 mm polyps in the rectum, removed with a cold biopsy forceps. Resected and ?retrieved. ?- Diverticulosis in the sigmoid colon. ?- Non-bleeding internal hemorrhoids. ?  ? ?Colonoscopy: 07/08/2015: Internal hemorrhoids otherwise unremarkable exam ?  ?Colonoscopy was in 2012 by Dr Olevia Perches 1 sessile polyp removed was polypoid normal mucosa with no adenoma. Prior to that she had a normal colonoscopy in 2006. Her mother had colon cancer at age 71 and paternal grandmother had colon cancer in her 51s. ?  ?GERD: Symptoms are stable on daily pantoprazole.  Denies any dysphagia, odynophagia, vomiting, melena or blood per rectum ? ?Outpatient Encounter Medications as of 07/28/2021  ?Medication Sig  ? ALPRAZolam (XANAX) 0.5 MG tablet Take 0.5 mg by mouth 2 (two) times daily.   ? amitriptyline (ELAVIL) 10 MG tablet Take 20 mg by mouth at bedtime.  ? busPIRone (BUSPAR) 5 MG tablet Take 5 mg by mouth 2 (two) times daily.   ? calcium carbonate (TUMS - DOSED IN MG ELEMENTAL CALCIUM) 500 MG chewable tablet Chew 1-2 tablets by mouth 3 (three) times daily as needed for indigestion or heartburn.  ? Calcium Carbonate-Vitamin D 600-400 MG-UNIT tablet Take 1 tablet by mouth daily.  ? clopidogrel (PLAVIX) 75 MG tablet TAKE ONE TABLET BY MOUTH DAILY  ? COVID-19 mRNA bivalent vaccine, Pfizer, (PFIZER COVID-19 VAC BIVALENT) injection Inject into the muscle.  ?  COVID-19 mRNA Vac-TriS, Pfizer, (PFIZER-BIONT COVID-19 VAC-TRIS) SUSP injection Inject into the muscle.  ? cyclobenzaprine (FLEXERIL) 10 MG tablet Take 1 tablet (10 mg total) by mouth 3 (three) times daily as needed for muscle spasms.  ? DULoxetine (CYMBALTA) 30 MG capsule Take 30 mg by mouth 3 (three) times daily.  ? fluticasone (FLONASE) 50 MCG/ACT nasal spray Place 2 sprays into both nostrils daily.  ? irbesartan (AVAPRO) 150 MG tablet TAKE ONE TABLET BY MOUTH DAILY  ? levothyroxine (SYNTHROID) 100 MCG tablet Take 1 tablet by mouth daily. And 1/2 tablet on Sundays  ? Magnesium 400 MG CAPS Take 400 mg by mouth daily.  ? metoprolol tartrate (LOPRESSOR) 25 MG tablet TAKE 1/2 TABLET BY MOUTH TWO TIMES A DAY WITH FOOD  ? nitroGLYCERIN (NITROSTAT) 0.4 MG SL tablet DISSOLVE 1 TAB UNDER TONGUE FOR CHEST PAIN - IF PAIN REMAINS AFTER 5 MIN, CALL 911 AND REPEAT DOSE. MAX 3 TABS IN 15 MINUTES  ? omega-3 acid ethyl esters (LOVAZA) 1 G capsule Take 2 g by mouth 2 (two) times daily.  ? oxybutynin (DITROPAN-XL) 10 MG 24 hr tablet Take 10 mg by mouth daily.  ? pantoprazole (PROTONIX) 40 MG tablet TAKE ONE TABLET BY MOUTH DAILY AS NEEDED FOR HEARTBURN, GERD OR REFLUX  ? rosuvastatin (CRESTOR) 40 MG tablet Take 1 tablet (40 mg total) by mouth daily.  ? Wheat Dextrin (BENEFIBER) POWD Take 1 Scoop by mouth 3 (three) times daily. As needed  ? Zoster Vaccine Adjuvanted [facility])  injection Inject into the muscle.  ? [DISCONTINUED] betamethasone valerate ointment (VALISONE) 0.1 % Apply a pea sized amount topically BID for 1-2 weeks as needed. Not for daily long term use. (Patient not taking: Reported on 02/28/2021)  ? [DISCONTINUED] estradiol (ESTRACE) 0.1 MG/GM vaginal cream 1 gram vaginally twice weekly  ? [DISCONTINUED] famotidine (PEPCID) 20 MG tablet Take 20 mg by mouth 2 (two) times daily. (Patient not taking: Reported on 02/28/2021)  ? ?Facility-Administered Encounter Medications as of 07/28/2021  ?Medication  ? 0.9 %  sodium  chloride infusion  ? ? ?Allergies as of 07/28/2021 - Review Complete 07/28/2021  ?Allergen Reaction Noted  ? Latex  05/04/2010  ? Lisinopril Cough 06/19/2010  ? Tramadol hcl Nausea And Vomiting 05/04/2010  ? ? ?Past Medical History:  ?Diagnosis Date  ? Angina pectoris ([place]) 2005  ? with stent placement Anterior decscending  ? Anxiety   ? Arthritis   ? Bone spur   ? Right Hip  ? CAD (coronary artery disease) 2005  ? s/p PCI LAD and cutting balloon angioplasty  with stent reexpansion 04/2008  ? Colon polyps   ? Degenerative disc disease   ? Depression   ? GERD (gastroesophageal reflux disease)   ? Hypercholesterolemia   ? Hyperlipemia   ? Hypertension   ? Hypothyroidism   ? MI (myocardial infarction) ([place]) 2005  ? angio with stent  ? Positive PPD   ? neg. CXR, treated with INH for 1 yr.  about 1982  ? UTI (urinary tract infection) 05/31/15  ? ? ?Past Surgical History:  ?Procedure Laterality Date  ? BUNIONECTOMY    ? COLONOSCOPY    ? CORONARY ANGIOPLASTY  2009  ? CORONARY ANGIOPLASTY WITH STENT PLACEMENT  2005  ? HEMORRHOID SURGERY  06/2015  ? Removal   ? LAPAROSCOPY    ? for endometriosis  ? LEFT HEART CATHETERIZATION WITH CORONARY ANGIOGRAM Bilateral 06/27/2011  ? Procedure: LEFT HEART CATHETERIZATION WITH CORONARY ANGIOGRAM;  Surgeon: Burnell Blanks, MD;  Location: [facility] CATH LAB;  Service: Cardiovascular;  Laterality: Bilateral;  ? LUMBAR EPIDURAL INJECTION    ? many  ? NERVE ROOT BLOCK    ? epidural steroids; multiple  ? POLYPECTOMY    ? ruptured disc  2007  ? with epidural   ? TONGUE BIOPSY    ? keratin accumulation - Benign   ? TONSILLECTOMY    ? TOTAL HIP ARTHROPLASTY Right 01/21/2018  ? Procedure: RIGHT TOTAL HIP ARTHROPLASTY ANTERIOR APPROACH;  Surgeon: Paralee Cancel, MD;  Location: [facility];  Service: Orthopedics;  Laterality: Right;  43mns  ? TUBAL LIGATION    ? ? ?Family History  ?Problem Relation Age of Onset  ? Colon cancer Mother 736 ? Stroke Mother   ? Hypertension Mother   ? Diabetes Mother   ? Rheum  arthritis Mother   ? Atrial fibrillation Mother   ? Heart attack Father   ?     x2  ? Diabetes Father   ? Kidney disease Father   ? Heart disease Father   ? Stroke Maternal Grandmother   ? Colon cancer Paternal Grandmother   ? Diabetes Paternal Grandmother   ? Esophageal cancer Cousin 680 ? Rectal cancer Neg Hx   ? Stomach cancer Neg Hx   ? ? ?Social History  ? ?Socioeconomic History  ? Marital status: Divorced  ?  Spouse name: Not on file  ? Number of children: 3  ? Years of education: Not on file  ? Highest  education level: Not on file  ?Occupational History  ? Occupation: Therapist, sports  ?  Employer: [employer]  ?Tobacco Use  ? Smoking status: Never  ? Smokeless tobacco: Never  ?Vaping Use  ? Vaping Use: Never used  ?Substance and Sexual Activity  ? Alcohol use: Yes  ?  Alcohol/week: 1.0 standard drink  ?  Types: 1 Glasses of wine per week  ?  Comment: occ wine  ? Drug use: No  ? Sexual activity: Not Currently  ?  Birth control/protection: Post-menopausal  ?  Comment: BTL 07/03/89  ?Other Topics Concern  ? Not on file  ?Social History Narrative  ? Not on file  ? ?Social Determinants of Health  ? ?Financial Resource Strain: Not on file  ?Food Insecurity: Not on file  ?Transportation Needs: Not on file  ?Physical Activity: Not on file  ?Stress: Not on file  ?Social Connections: Not on file  ?Intimate Partner Violence: Not on file  ? ? ? ? ?Review of systems: ?All other review of systems negative except as mentioned in the HPI. ? ? ?Physical Exam: ?Vitals:  ? 07/28/21 1114  ?BP: 104/70  ?Pulse: 81  ? ?Body mass index is 30.79 kg/m?. ?Gen:      No acute distress ?Neuro: alert and oriented x 3 ?Psych: normal mood and affect ?Rectal exam: Normal anal sphincter tone, small anterior skin tag, no anal fissure or external hemorrhoids ?Anoscopy: Small internal hemorrhoids, no active bleeding, normal dentate line, no visible nodules ? ? ?Data Reviewed: ? ?Reviewed labs, radiology imaging, old records and pertinent past GI work  up ? ? ?Assessment and Plan/Recommendations: ? ?66 year old very pleasant female with family history of colon cancer, GERD, chronic idiopathic constipation here for follow-up visit for hemorrhoids ? ?She has grade 1

## 2021-08-14 ENCOUNTER — Other Ambulatory Visit: Payer: Self-pay | Admitting: Cardiovascular Disease

## 2021-08-14 DIAGNOSIS — I2510 Atherosclerotic heart disease of native coronary artery without angina pectoris: Secondary | ICD-10-CM

## 2021-08-14 DIAGNOSIS — Z96641 Presence of right artificial hip joint: Secondary | ICD-10-CM

## 2021-08-14 DIAGNOSIS — R0789 Other chest pain: Secondary | ICD-10-CM

## 2021-08-30 ENCOUNTER — Other Ambulatory Visit: Payer: Self-pay | Admitting: Cardiovascular Disease

## 2021-10-14 ENCOUNTER — Other Ambulatory Visit: Payer: Self-pay | Admitting: Cardiovascular Disease

## 2021-12-24 ENCOUNTER — Emergency Department (HOSPITAL_COMMUNITY)
Admission: EM | Admit: 2021-12-24 | Discharge: 2021-12-25 | Discharge status: Against Medical Advice | Payer: PPO | Attending: Emergency Medicine | Admitting: Emergency Medicine

## 2021-12-24 ENCOUNTER — Other Ambulatory Visit: Payer: Self-pay

## 2021-12-24 ENCOUNTER — Encounter (HOSPITAL_COMMUNITY): Payer: Self-pay

## 2021-12-24 DIAGNOSIS — Z5321 Procedure and treatment not carried out due to patient leaving prior to being seen by health care provider: Secondary | ICD-10-CM | POA: Insufficient documentation

## 2021-12-24 DIAGNOSIS — Y900 Blood alcohol level of less than 20 mg/100 ml: Secondary | ICD-10-CM | POA: Insufficient documentation

## 2021-12-24 DIAGNOSIS — T424X1A Poisoning by benzodiazepines, accidental (unintentional), initial encounter: Secondary | ICD-10-CM | POA: Diagnosis not present

## 2021-12-24 LAB — COMPREHENSIVE METABOLIC PANEL
ALT: 31 U/L (ref 0–44)
AST: 32 U/L (ref 15–41)
Albumin: 4.3 g/dL (ref 3.5–5.0)
Alkaline Phosphatase: 80 U/L (ref 38–126)
Anion gap: 8 (ref 5–15)
BUN: 15 mg/dL (ref 8–23)
CO2: 28 mmol/L (ref 22–32)
Calcium: 9.2 mg/dL (ref 8.9–10.3)
Chloride: 101 mmol/L (ref 98–111)
Creatinine, Ser: 0.77 mg/dL (ref 0.44–1.00)
GFR, Estimated: >60 mL/min (ref >60)
Glucose, Bld: 112 mg/dL — ABNORMAL HIGH (ref 70–99)
Potassium: 4 mmol/L (ref 3.5–5.1)
Sodium: 137 mmol/L (ref 135–145)
Total Bilirubin: 0.9 mg/dL (ref 0.3–1.2)
Total Protein: 7.4 g/dL (ref 6.5–8.1)

## 2021-12-24 LAB — CBC
HCT: 45.1 % (ref 36.0–46.0)
Hemoglobin: 15.4 g/dL — ABNORMAL HIGH (ref 12.0–15.0)
MCH: 30.6 pg (ref 26.0–34.0)
MCHC: 34.1 g/dL (ref 30.0–36.0)
MCV: 89.7 fL (ref 80.0–100.0)
Platelets: 261 10*3/uL (ref 150–400)
RBC: 5.03 MIL/uL (ref 3.87–5.11)
RDW: 14 % (ref 11.5–15.5)
WBC: 7.3 10*3/uL (ref 4.0–10.5)
nRBC: 0 % (ref 0.0–0.2)

## 2021-12-24 LAB — RAPID URINE DRUG SCREEN, HOSP PERFORMED
Amphetamines: NOT DETECTED
Barbiturates: NOT DETECTED
Benzodiazepines: POSITIVE — AB
Cocaine: NOT DETECTED
Opiates: NOT DETECTED
Tetrahydrocannabinol: NOT DETECTED

## 2021-12-24 LAB — ACETAMINOPHEN LEVEL: Acetaminophen (Tylenol), Serum: <10 ug/mL — ABNORMAL LOW (ref 10–30)

## 2021-12-24 LAB — SALICYLATE LEVEL: Salicylate Lvl: <7 mg/dL — ABNORMAL LOW (ref 7.0–30.0)

## 2021-12-24 LAB — ETHANOL: Alcohol, Ethyl (B): <10 mg/dL (ref <10)

## 2021-12-24 NOTE — ED Triage Notes (Addendum)
Patient said she is sad because she is not keeping her grand children for after school care. She said she has not been able to sleep the past few nights, so she took '4mg'$  total of Xanax at 4pm along with wine. Her intent was not to commit to suicide, "I just wanted to sleep." She feels overwhelmed right now with life. Patient has anxiety, depression and PTSD.

## 2022-01-03 DIAGNOSIS — L282 Other prurigo: Secondary | ICD-10-CM | POA: Diagnosis not present

## 2022-01-03 DIAGNOSIS — F341 Dysthymic disorder: Secondary | ICD-10-CM | POA: Diagnosis not present

## 2022-01-03 DIAGNOSIS — T07XXXA Unspecified multiple injuries, initial encounter: Secondary | ICD-10-CM | POA: Diagnosis not present

## 2022-01-03 DIAGNOSIS — L304 Erythema intertrigo: Secondary | ICD-10-CM | POA: Diagnosis not present

## 2022-01-12 DIAGNOSIS — E785 Hyperlipidemia, unspecified: Secondary | ICD-10-CM | POA: Diagnosis not present

## 2022-01-12 DIAGNOSIS — R739 Hyperglycemia, unspecified: Secondary | ICD-10-CM | POA: Diagnosis not present

## 2022-01-12 DIAGNOSIS — E039 Hypothyroidism, unspecified: Secondary | ICD-10-CM | POA: Diagnosis not present

## 2022-01-12 DIAGNOSIS — R7989 Other specified abnormal findings of blood chemistry: Secondary | ICD-10-CM | POA: Diagnosis not present

## 2022-01-12 DIAGNOSIS — F419 Anxiety disorder, unspecified: Secondary | ICD-10-CM | POA: Diagnosis not present

## 2022-01-19 DIAGNOSIS — F4310 Post-traumatic stress disorder, unspecified: Secondary | ICD-10-CM | POA: Diagnosis not present

## 2022-01-19 DIAGNOSIS — R82998 Other abnormal findings in urine: Secondary | ICD-10-CM | POA: Diagnosis not present

## 2022-01-19 DIAGNOSIS — E669 Obesity, unspecified: Secondary | ICD-10-CM | POA: Diagnosis not present

## 2022-01-19 DIAGNOSIS — Z0000 Encounter for general adult medical examination without abnormal findings: Secondary | ICD-10-CM | POA: Diagnosis not present

## 2022-01-19 DIAGNOSIS — E785 Hyperlipidemia, unspecified: Secondary | ICD-10-CM | POA: Diagnosis not present

## 2022-01-19 DIAGNOSIS — M8580 Other specified disorders of bone density and structure, unspecified site: Secondary | ICD-10-CM | POA: Diagnosis not present

## 2022-01-19 DIAGNOSIS — D692 Other nonthrombocytopenic purpura: Secondary | ICD-10-CM | POA: Diagnosis not present

## 2022-01-19 DIAGNOSIS — I119 Hypertensive heart disease without heart failure: Secondary | ICD-10-CM | POA: Diagnosis not present

## 2022-01-19 DIAGNOSIS — F329 Major depressive disorder, single episode, unspecified: Secondary | ICD-10-CM | POA: Diagnosis not present

## 2022-01-19 DIAGNOSIS — I2510 Atherosclerotic heart disease of native coronary artery without angina pectoris: Secondary | ICD-10-CM | POA: Diagnosis not present

## 2022-01-19 DIAGNOSIS — F419 Anxiety disorder, unspecified: Secondary | ICD-10-CM | POA: Diagnosis not present

## 2022-01-19 DIAGNOSIS — E039 Hypothyroidism, unspecified: Secondary | ICD-10-CM | POA: Diagnosis not present

## 2022-01-19 DIAGNOSIS — M1990 Unspecified osteoarthritis, unspecified site: Secondary | ICD-10-CM | POA: Diagnosis not present

## 2022-01-31 DIAGNOSIS — F341 Dysthymic disorder: Secondary | ICD-10-CM | POA: Diagnosis not present

## 2022-02-06 ENCOUNTER — Other Ambulatory Visit (HOSPITAL_BASED_OUTPATIENT_CLINIC_OR_DEPARTMENT_OTHER): Payer: Self-pay

## 2022-02-06 MED ORDER — INFLUENZA VAC A&B SA ADJ QUAD 0.5 ML IM PRSY
PREFILLED_SYRINGE | INTRAMUSCULAR | 0 refills | Status: AC
  Filled 2022-02-06: qty 0.5, 1d supply, fill #0

## 2022-02-10 ENCOUNTER — Other Ambulatory Visit: Payer: Self-pay | Admitting: Cardiovascular Disease

## 2022-02-10 DIAGNOSIS — I2510 Atherosclerotic heart disease of native coronary artery without angina pectoris: Secondary | ICD-10-CM

## 2022-02-10 DIAGNOSIS — Z96641 Presence of right artificial hip joint: Secondary | ICD-10-CM

## 2022-02-10 DIAGNOSIS — R0789 Other chest pain: Secondary | ICD-10-CM

## 2022-02-22 ENCOUNTER — Other Ambulatory Visit: Payer: Self-pay | Admitting: Cardiovascular Disease

## 2022-02-25 ENCOUNTER — Encounter: Payer: Self-pay | Admitting: Cardiovascular Disease

## 2022-02-25 NOTE — Progress Notes (Unsigned)
Sarah Ballard Date of Birth  [birth year] [facility] [address]    [place], [place]  [address] [phone]  Fax  [phone]  Problem List 1. Coronary artery disease-  2. anxiety 3. Hyperlipidemia 4. Hypertension 5. Hypothyroidism 6. Chronic back pain     The 66 yo  female with a history of coronary artery disease. She's status post PTCA and stenting of her left anterior descending artery. She also had Cutting Balloon procedure and reexpansion of that stent in January 2010.  She was admitted recently to the hospital for chest pain an dizziness and cath revealed no significant irregularities.  He is walking twice a week. She's not having episodes of angina with walking. 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  October 27, 2012:  Sarah Ballard is doing well from a cardiac standpoint.   She is having some back pain - needs to have a back injection but does not want to stop the Plavix.    October 27, 2013:  Sarah Ballard is doing well.  She denies any chest pain.  Bumping into lots of things and has bruising on her legs.   Her potassium was low at her last check.    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  Asked about stopping the plavix   Jan. 24, 2017:  Sarah Ballard is doing ok Has been losing weight.   Lots of stress with taking care of her father and her divorce and her son .  No CP .     November 17, 2015:  Doing ok.  Sleepy .  Had to work late last night . No CP,  Breathing is good Lots of anxiety with work stress.   July 12, 2016:  Doing ok from a cardiac standpoint Has gained some weight .  Rare episodes of CP - she thinks its due to stress or indigestion,   its not related to exertion  requests a refill of her NTG  Is trying to move  Is having lots of artheritis   Pain  Is falling lots at work   Nov. 14,  2018  Has  Moved since I last saw her. Is having some chest pain  Thinks its GERD or anxeity.  Lots of stress  Lots of stress related to the move.    The CP are atypical , do not feel like her previouis angina Takes NTG - does not really relieve the CP Finds [employer] work   Is planning on having bunion surgery at the end of this year Will be at low risk Will need to hold Plavix for 5 days prior to surgery .    Father has gained some fluid .   Feb. 27, 2019 Doing well Is very appreciative that I sent her to see Ezekiel Slocumb for her anxiety .  Father recently had the flu  Sarah Ballard has had a cough / cold.   Developed quickly. Has been seen by Dr. 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    December 26, 2017:  Sarah Ballard is seen today for follow-up visit. MRI of her hip shows that she needs to have a hip replacement Sees Dr. Alvan Dame . No angina    April, 23, 2021:  Sarah Ballard is doing well  Has some indigestion  Not relieved with  protonix.  , suggested pepcid complete in addition to her Protonix   No angina  Needs to exercise more.  Has gained some weight Has been getting short of breath while walking ,  I suspect most of this dyspnea is due to weight gain and general deconditioning .  No angina similar to her presenting symptoms of angina pain from years ago .   February 22, 2020: Sarah Ballard is doing well.  She is seen today for follow-up of her coronary artery disease and hyperlipidemia.  Her blood pressure and heart rate are normal. Had covid Sept. 22 .   Has had 2 Pfizer covid vaccines and now has had the booster (oct. 22) . Marland Kitchen Had chills, headache  vomitting , sneezing for a few days  Is still fatigued.   Nov. 1, 2022 Sarah Ballard is seen today for follow up of her CAD , HLD No cardiac issues,  no angina   Had bilateral knee injections with Dr. Alvan Dame Injections has helped quite a bit   Walks her dog daily .  Does some video exercises  I recommended a stationary bike    Continues to struggling with weight gain .   Cant keep consistent weight loss program    Oct. 30, 2023 Sarah Ballard is seen today for follow up of her CAD  Wt today is  177 lbs   Her MD changed her anxiety meds   Doing well from a cardiac standpoint Has some GERD symptoms  Is going to the GYM.   Has been told she is prediabetic ( was eating lots of ice cream this summer )   No CP, No CP with working out .  Limited by knee pain    Lipids from Sept 15, 2023 were reviewed and look good  Total cholesterol is 131 HDL is 51 LDL is 63 Glyceride level is 87   Current Outpatient Medications on File Prior to Visit  Medication Sig Dispense Refill   amitriptyline (ELAVIL) 10 MG tablet Take 20 mg by mouth at bedtime.     busPIRone (BUSPAR) 10 MG tablet      calcium carbonate (TUMS - DOSED IN MG ELEMENTAL CALCIUM) 500 MG chewable tablet Chew 1-2 tablets by mouth 3 (three) times daily as needed for indigestion or heartburn.     Calcium Carbonate-Vit D-Min (CALCIUM 600+D3 PLUS MINERALS) 600-800 MG-UNIT CHEW Take one tablet by mouth twice daily Oral     Calcium Carbonate-Vitamin D 600-400 MG-UNIT tablet Take 1 tablet by mouth daily.     clopidogrel (PLAVIX) 75 MG tablet TAKE ONE TABLET BY MOUTH DAILY 90 tablet 0   COVID-19 mRNA bivalent vaccine, Pfizer, (PFIZER COVID-19 VAC BIVALENT) injection Inject into the muscle. 0.3 mL 0   COVID-19 mRNA Vac-TriS, Pfizer, (PFIZER-BIONT COVID-19 VAC-TRIS) SUSP injection Inject into the muscle. 0.3 mL 0   cyclobenzaprine (FLEXERIL) 10 MG tablet Take 1 tablet (10 mg total) by mouth 3 (three) times daily as needed for muscle spasms. 30 tablet 0   Dextromethorphan-guaiFENesin 10-200 MG CAPS 1x a day Oral     fluticasone (FLONASE) 50 MCG/ACT nasal spray Place 2 sprays into both nostrils daily.     influenza vaccine adjuvanted (FLUAD) 0.5 ML injection Inject into the muscle. 0.5 mL 0   irbesartan (AVAPRO) 150 MG tablet TAKE ONE TABLET BY MOUTH DAILY 90 tablet 1    levothyroxine (SYNTHROID) 100 MCG tablet Take 1 tablet by mouth daily. And 1/2 tablet on Sundays     LORazepam (ATIVAN) 0.5 MG tablet  Magnesium 400 MG CAPS Take 400 mg by mouth daily.     metoprolol tartrate (LOPRESSOR) 25 MG tablet TAKE 1/2 TABLET BY MOUTH TWICE A DAY WITH FOOD 90 tablet 1   nitroGLYCERIN (NITROSTAT) 0.4 MG SL tablet DISSOLVE 1 TAB UNDER TONGUE FOR CHEST PAIN - IF PAIN REMAINS AFTER 5 MIN, CALL 911 AND REPEAT DOSE. MAX 3 TABS IN 15 MINUTES 25 tablet 3   omega-3 acid ethyl esters (LOVAZA) 1 G capsule Take 2 g by mouth 2 (two) times daily.     oxybutynin (DITROPAN XL) 10 MG 24 hr tablet take daily Oral     oxybutynin (DITROPAN-XL) 10 MG 24 hr tablet Take 10 mg by mouth daily.  11   pantoprazole (PROTONIX) 40 MG tablet TAKE ONE TABLET BY MOUTH DAILY AS NEEDED FOR HEARTBURN, GERD OR REFLUX 90 tablet 3   rosuvastatin (CRESTOR) 40 MG tablet Take 1 tablet (40 mg total) by mouth daily. 90 tablet 3   SM Zinc 50 MG TABS 1 daily Oral     venlafaxine XR (EFFEXOR-XR) 37.5 MG 24 hr capsule      Wheat Dextrin (BENEFIBER) POWD Take 1 Scoop by mouth 3 (three) times daily. As needed     Zoster Vaccine Adjuvanted [facility]) injection Inject into the muscle. 0.5 mL 0   Current Facility-Administered Medications on File Prior to Visit  Medication Dose Route Frequency Provider Last Rate Last Admin   0.9 %  sodium chloride infusion  500 mL Intravenous Once Shon Baton, MD        Allergies  Allergen Reactions   Latex     itching   Lisinopril Cough   Tramadol Hcl Nausea And Vomiting   Zoster Vac Recomb Adjuvanted Other (See Comments)    Past Medical History:  Diagnosis Date   Angina pectoris ([place]) 2005   with stent placement Anterior decscending   Anxiety    Arthritis    Bone spur    Right Hip   CAD (coronary artery disease) 2005   s/p PCI LAD and cutting balloon angioplasty  with stent reexpansion 04/2008   Colon polyps    Degenerative disc disease    Depression    GERD  (gastroesophageal reflux disease)    Hypercholesterolemia    Hyperlipemia    Hypertension    Hypothyroidism    MI (myocardial infarction) ([place]) 2005   angio with stent   Positive PPD    neg. CXR, treated with INH for 1 yr.  about 1982   UTI (urinary tract infection) 05/31/15    Past Surgical History:  Procedure Laterality Date   BUNIONECTOMY     COLONOSCOPY     CORONARY ANGIOPLASTY  2009   CORONARY ANGIOPLASTY WITH STENT PLACEMENT  2005   HEMORRHOID SURGERY  06/2015   Removal    LAPAROSCOPY     for endometriosis   LEFT HEART CATHETERIZATION WITH CORONARY ANGIOGRAM Bilateral 06/27/2011   Procedure: LEFT HEART CATHETERIZATION WITH CORONARY ANGIOGRAM;  Surgeon: Burnell Blanks, MD;  Location: [facility] CATH LAB;  Service: Cardiovascular;  Laterality: Bilateral;   LUMBAR EPIDURAL INJECTION     many   NERVE ROOT BLOCK     epidural steroids; multiple   POLYPECTOMY     ruptured disc  2007   with epidural    TONGUE BIOPSY     keratin accumulation - Benign    TONSILLECTOMY     TOTAL HIP ARTHROPLASTY Right 01/21/2018   Procedure: RIGHT TOTAL HIP ARTHROPLASTY ANTERIOR APPROACH;  Surgeon: Paralee Cancel,  MD;  Location: [facility] ORS;  Service: Orthopedics;  Laterality: Right;  23mns   TUBAL LIGATION      Social History   Tobacco Use  Smoking Status Never  Smokeless Tobacco Never    Social History   Substance and Sexual Activity  Alcohol Use Yes   Alcohol/week: 1.0 standard drink of alcohol   Types: 1 Glasses of wine per week   Comment: occ wine    Family History  Problem Relation Age of Onset   Colon cancer Mother 750  Stroke Mother    Hypertension Mother    Diabetes Mother    Rheum arthritis Mother    Atrial fibrillation Mother    Heart attack Father        x2   Diabetes Father    Kidney disease Father    Heart disease Father    Stroke Maternal Grandmother    Colon cancer Paternal Grandmother    Diabetes Paternal Grandmother    Esophageal cancer Cousin [phone]  Rectal  cancer Neg Hx    Stomach cancer Neg Hx     Reviw of Systems:  Reviewed in the HPI.  All other systems are negative.   Physical Exam: Blood pressure 110/86, pulse 80, height 5' 5.5" (1.664 m), weight 177 lb 6.4 oz (80.5 kg), last menstrual period 06/29/2006, SpO2 95 %.       GEN:  Well nourished, well developed in no acute distress HEENT: Normal NECK: No JVD; No carotid bruits LYMPHATICS: No lymphadenopathy CARDIAC: RRR, no murmurs, rubs, gallops RESPIRATORY:  Clear to auscultation without rales, wheezing or rhonchi  ABDOMEN: Soft, non-tender, non-distended MUSCULOSKELETAL:  No edema; No deformity  SKIN: Warm and dry NEUROLOGIC:  Alert and oriented x 3  ECG :  oct. 30, 2023.  NSR at 80.   Previous ant MI   Assessment / Plan:   1. Coronary artery disease-      no angina .  Cont met    2. Anxiety -    has significant issues with anxiety .      3. Hyperlipidemia -      labs reviewed.   Managed by Dr. RVirgina Jock     4. Hypertension  -    BP is well controlled.   5. Hypothyroidism  6. Chronic back pain  7. Weight gain :       .      PMertie Moores MD  02/26/2022 11:52 AM    [email or web address]  [id number] [phone]: [phone] Fax: [phone]

## 2022-02-26 ENCOUNTER — Ambulatory Visit: Payer: PPO | Attending: Cardiovascular Disease | Admitting: Cardiovascular Disease

## 2022-02-26 VITALS — BP 110/86 | HR 80 | Ht 65.5 in | Wt 177.4 lb

## 2022-02-26 DIAGNOSIS — F419 Anxiety disorder, unspecified: Secondary | ICD-10-CM | POA: Diagnosis not present

## 2022-02-26 DIAGNOSIS — I2510 Atherosclerotic heart disease of native coronary artery without angina pectoris: Secondary | ICD-10-CM

## 2022-02-26 NOTE — Patient Instructions (Signed)
Medication Instructions:  Your physician recommends that you continue on your current medications as directed. Please refer to the Current Medication list given to you today.  *If you need a refill on your cardiac medications before your next appointment, please call your pharmacy*   Lab Work: NONE If you have labs (blood work) drawn today and your tests are completely normal, you will receive your results only by: MyChart Message (if you have MyChart) OR A paper copy in the mail If you have any lab test that is abnormal or we need to change your treatment, we will call you to review the results.   Testing/Procedures: NONE   Follow-Up: At [facility], you and your health needs are our priority.  As part of our continuing mission to provide you with exceptional heart care, we have created designated Provider Care Teams.  These Care Teams include your primary Cardiologist (physician) and Advanced Practice Providers (APPs -  Physician Assistants and Nurse Practitioners) who all work together to provide you with the care you need, when you need it.  Your next appointment:   1 year(s)  The format for your next appointment:   In Person  Provider:   Philip Nahser, MD    Important Information About Sugar       

## 2022-02-27 NOTE — Addendum Note (Signed)
Addended by: Sharee Holster R on: 02/27/2022 05:14 PM   Modules accepted: Orders

## 2022-03-01 DIAGNOSIS — M25562 Pain in left knee: Secondary | ICD-10-CM | POA: Diagnosis not present

## 2022-03-01 DIAGNOSIS — M25561 Pain in right knee: Secondary | ICD-10-CM | POA: Diagnosis not present

## 2022-03-01 DIAGNOSIS — M170 Bilateral primary osteoarthritis of knee: Secondary | ICD-10-CM | POA: Diagnosis not present

## 2022-03-05 ENCOUNTER — Other Ambulatory Visit: Payer: Self-pay | Admitting: Cardiovascular Disease

## 2022-03-30 DIAGNOSIS — N3941 Urge incontinence: Secondary | ICD-10-CM | POA: Diagnosis not present

## 2022-03-30 DIAGNOSIS — R350 Frequency of micturition: Secondary | ICD-10-CM | POA: Diagnosis not present

## 2022-04-02 DIAGNOSIS — F341 Dysthymic disorder: Secondary | ICD-10-CM | POA: Diagnosis not present

## 2022-04-13 ENCOUNTER — Other Ambulatory Visit: Payer: Self-pay | Admitting: Cardiovascular Disease

## 2022-04-13 DIAGNOSIS — Z1231 Encounter for screening mammogram for malignant neoplasm of breast: Secondary | ICD-10-CM | POA: Diagnosis not present

## 2022-05-10 ENCOUNTER — Other Ambulatory Visit: Payer: Self-pay | Admitting: Cardiovascular Disease

## 2022-05-10 DIAGNOSIS — I2510 Atherosclerotic heart disease of native coronary artery without angina pectoris: Secondary | ICD-10-CM

## 2022-05-10 DIAGNOSIS — Z96641 Presence of right artificial hip joint: Secondary | ICD-10-CM

## 2022-05-10 DIAGNOSIS — R0789 Other chest pain: Secondary | ICD-10-CM

## 2022-06-04 DIAGNOSIS — F341 Dysthymic disorder: Secondary | ICD-10-CM | POA: Diagnosis not present

## 2022-06-08 DIAGNOSIS — M170 Bilateral primary osteoarthritis of knee: Secondary | ICD-10-CM | POA: Diagnosis not present

## 2022-06-19 ENCOUNTER — Other Ambulatory Visit: Payer: Self-pay | Admitting: Cardiovascular Disease

## 2022-06-20 ENCOUNTER — Other Ambulatory Visit: Payer: Self-pay | Admitting: Cardiovascular Disease

## 2022-06-25 DIAGNOSIS — I119 Hypertensive heart disease without heart failure: Secondary | ICD-10-CM | POA: Diagnosis not present

## 2022-06-25 DIAGNOSIS — J069 Acute upper respiratory infection, unspecified: Secondary | ICD-10-CM | POA: Diagnosis not present

## 2022-06-25 DIAGNOSIS — J029 Acute pharyngitis, unspecified: Secondary | ICD-10-CM | POA: Diagnosis not present

## 2022-06-25 DIAGNOSIS — G43909 Migraine, unspecified, not intractable, without status migrainosus: Secondary | ICD-10-CM | POA: Diagnosis not present

## 2022-06-25 DIAGNOSIS — R051 Acute cough: Secondary | ICD-10-CM | POA: Diagnosis not present

## 2022-06-25 DIAGNOSIS — Z1152 Encounter for screening for COVID-19: Secondary | ICD-10-CM | POA: Diagnosis not present

## 2022-06-25 DIAGNOSIS — R0981 Nasal congestion: Secondary | ICD-10-CM | POA: Diagnosis not present

## 2022-07-02 DIAGNOSIS — F4310 Post-traumatic stress disorder, unspecified: Secondary | ICD-10-CM | POA: Diagnosis not present

## 2022-07-02 DIAGNOSIS — E669 Obesity, unspecified: Secondary | ICD-10-CM | POA: Diagnosis not present

## 2022-07-02 DIAGNOSIS — I119 Hypertensive heart disease without heart failure: Secondary | ICD-10-CM | POA: Diagnosis not present

## 2022-07-02 DIAGNOSIS — F419 Anxiety disorder, unspecified: Secondary | ICD-10-CM | POA: Diagnosis not present

## 2022-07-02 DIAGNOSIS — E785 Hyperlipidemia, unspecified: Secondary | ICD-10-CM | POA: Diagnosis not present

## 2022-07-02 DIAGNOSIS — R739 Hyperglycemia, unspecified: Secondary | ICD-10-CM | POA: Diagnosis not present

## 2022-07-02 DIAGNOSIS — I2510 Atherosclerotic heart disease of native coronary artery without angina pectoris: Secondary | ICD-10-CM | POA: Diagnosis not present

## 2022-07-02 DIAGNOSIS — D692 Other nonthrombocytopenic purpura: Secondary | ICD-10-CM | POA: Diagnosis not present

## 2022-07-02 DIAGNOSIS — E039 Hypothyroidism, unspecified: Secondary | ICD-10-CM | POA: Diagnosis not present

## 2022-07-02 DIAGNOSIS — R0989 Other specified symptoms and signs involving the circulatory and respiratory systems: Secondary | ICD-10-CM | POA: Diagnosis not present

## 2022-07-02 DIAGNOSIS — F329 Major depressive disorder, single episode, unspecified: Secondary | ICD-10-CM | POA: Diagnosis not present

## 2022-07-18 DIAGNOSIS — D225 Melanocytic nevi of trunk: Secondary | ICD-10-CM | POA: Diagnosis not present

## 2022-07-18 DIAGNOSIS — D2261 Melanocytic nevi of right upper limb, including shoulder: Secondary | ICD-10-CM | POA: Diagnosis not present

## 2022-07-18 DIAGNOSIS — L814 Other melanin hyperpigmentation: Secondary | ICD-10-CM | POA: Diagnosis not present

## 2022-07-18 DIAGNOSIS — D692 Other nonthrombocytopenic purpura: Secondary | ICD-10-CM | POA: Diagnosis not present

## 2022-07-18 DIAGNOSIS — D2371 Other benign neoplasm of skin of right lower limb, including hip: Secondary | ICD-10-CM | POA: Diagnosis not present

## 2022-07-18 DIAGNOSIS — D224 Melanocytic nevi of scalp and neck: Secondary | ICD-10-CM | POA: Diagnosis not present

## 2022-07-18 DIAGNOSIS — D1801 Hemangioma of skin and subcutaneous tissue: Secondary | ICD-10-CM | POA: Diagnosis not present

## 2022-07-18 DIAGNOSIS — L821 Other seborrheic keratosis: Secondary | ICD-10-CM | POA: Diagnosis not present

## 2022-07-23 DIAGNOSIS — F341 Dysthymic disorder: Secondary | ICD-10-CM | POA: Diagnosis not present

## 2022-07-29 ENCOUNTER — Other Ambulatory Visit: Payer: Self-pay | Admitting: Gastroenterology

## 2022-07-31 ENCOUNTER — Other Ambulatory Visit: Payer: Self-pay | Admitting: Gastroenterology

## 2022-10-02 DIAGNOSIS — F341 Dysthymic disorder: Secondary | ICD-10-CM | POA: Diagnosis not present

## 2023-01-02 DIAGNOSIS — F341 Dysthymic disorder: Secondary | ICD-10-CM | POA: Diagnosis not present

## 2023-01-05 ENCOUNTER — Other Ambulatory Visit: Payer: Self-pay | Admitting: Cardiovascular Disease

## 2023-01-28 DIAGNOSIS — R739 Hyperglycemia, unspecified: Secondary | ICD-10-CM | POA: Diagnosis not present

## 2023-01-28 DIAGNOSIS — I2510 Atherosclerotic heart disease of native coronary artery without angina pectoris: Secondary | ICD-10-CM | POA: Diagnosis not present

## 2023-01-28 DIAGNOSIS — E785 Hyperlipidemia, unspecified: Secondary | ICD-10-CM | POA: Diagnosis not present

## 2023-01-28 DIAGNOSIS — I119 Hypertensive heart disease without heart failure: Secondary | ICD-10-CM | POA: Diagnosis not present

## 2023-01-28 DIAGNOSIS — Z0189 Encounter for other specified special examinations: Secondary | ICD-10-CM | POA: Diagnosis not present

## 2023-01-28 DIAGNOSIS — E039 Hypothyroidism, unspecified: Secondary | ICD-10-CM | POA: Diagnosis not present

## 2023-01-28 DIAGNOSIS — M8580 Other specified disorders of bone density and structure, unspecified site: Secondary | ICD-10-CM | POA: Diagnosis not present

## 2023-01-28 DIAGNOSIS — Z1212 Encounter for screening for malignant neoplasm of rectum: Secondary | ICD-10-CM | POA: Diagnosis not present

## 2023-01-30 ENCOUNTER — Other Ambulatory Visit: Payer: Self-pay | Admitting: Cardiovascular Disease

## 2023-01-30 DIAGNOSIS — Z96641 Presence of right artificial hip joint: Secondary | ICD-10-CM

## 2023-01-30 DIAGNOSIS — I2510 Atherosclerotic heart disease of native coronary artery without angina pectoris: Secondary | ICD-10-CM

## 2023-01-30 DIAGNOSIS — R0789 Other chest pain: Secondary | ICD-10-CM

## 2023-02-04 DIAGNOSIS — E785 Hyperlipidemia, unspecified: Secondary | ICD-10-CM | POA: Diagnosis not present

## 2023-02-04 DIAGNOSIS — M8580 Other specified disorders of bone density and structure, unspecified site: Secondary | ICD-10-CM | POA: Diagnosis not present

## 2023-02-04 DIAGNOSIS — M1990 Unspecified osteoarthritis, unspecified site: Secondary | ICD-10-CM | POA: Diagnosis not present

## 2023-02-04 DIAGNOSIS — Z23 Encounter for immunization: Secondary | ICD-10-CM | POA: Diagnosis not present

## 2023-02-04 DIAGNOSIS — R32 Unspecified urinary incontinence: Secondary | ICD-10-CM | POA: Diagnosis not present

## 2023-02-04 DIAGNOSIS — I119 Hypertensive heart disease without heart failure: Secondary | ICD-10-CM | POA: Diagnosis not present

## 2023-02-04 DIAGNOSIS — Z1331 Encounter for screening for depression: Secondary | ICD-10-CM | POA: Diagnosis not present

## 2023-02-04 DIAGNOSIS — R82998 Other abnormal findings in urine: Secondary | ICD-10-CM | POA: Diagnosis not present

## 2023-02-04 DIAGNOSIS — I2510 Atherosclerotic heart disease of native coronary artery without angina pectoris: Secondary | ICD-10-CM | POA: Diagnosis not present

## 2023-02-04 DIAGNOSIS — F329 Major depressive disorder, single episode, unspecified: Secondary | ICD-10-CM | POA: Diagnosis not present

## 2023-02-04 DIAGNOSIS — D692 Other nonthrombocytopenic purpura: Secondary | ICD-10-CM | POA: Diagnosis not present

## 2023-02-04 DIAGNOSIS — E669 Obesity, unspecified: Secondary | ICD-10-CM | POA: Diagnosis not present

## 2023-02-04 DIAGNOSIS — Z1389 Encounter for screening for other disorder: Secondary | ICD-10-CM | POA: Diagnosis not present

## 2023-02-04 DIAGNOSIS — Z0000 Encounter for general adult medical examination without abnormal findings: Secondary | ICD-10-CM | POA: Diagnosis not present

## 2023-02-04 DIAGNOSIS — E039 Hypothyroidism, unspecified: Secondary | ICD-10-CM | POA: Diagnosis not present

## 2023-02-04 DIAGNOSIS — F4310 Post-traumatic stress disorder, unspecified: Secondary | ICD-10-CM | POA: Diagnosis not present

## 2023-02-13 DIAGNOSIS — M170 Bilateral primary osteoarthritis of knee: Secondary | ICD-10-CM | POA: Diagnosis not present

## 2023-02-26 ENCOUNTER — Other Ambulatory Visit: Payer: Self-pay | Admitting: Cardiovascular Disease

## 2023-02-26 DIAGNOSIS — Z96641 Presence of right artificial hip joint: Secondary | ICD-10-CM

## 2023-02-26 DIAGNOSIS — I2510 Atherosclerotic heart disease of native coronary artery without angina pectoris: Secondary | ICD-10-CM

## 2023-02-26 DIAGNOSIS — R0789 Other chest pain: Secondary | ICD-10-CM

## 2023-03-11 ENCOUNTER — Encounter: Payer: Self-pay | Admitting: Cardiovascular Disease

## 2023-03-11 NOTE — Progress Notes (Unsigned)
Sarah Ballard Date of Birth  [birth year] [facility] [address]    [address] [place], [place]  [address] [phone]  Fax  [phone]  Problem List 1. Coronary artery disease-  2. anxiety 3. Hyperlipidemia 4. Hypertension 5. Hypothyroidism 6. Chronic back pain     The 67 yo  female with a history of coronary artery disease. She's status post PTCA and stenting of her left anterior descending artery. She also had Cutting Balloon procedure and reexpansion of that stent in January 2010.  She was admitted recently to the hospital for chest pain an dizziness and cath revealed no significant irregularities.  He is walking twice a week. She's not having episodes of angina with walking. 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  October 27, 2012:  Sarah Ballard is doing well from a cardiac standpoint.   She is having some back pain - needs to have a back injection but does not want to stop the Plavix.    October 27, 2013:  Sarah Ballard is doing well.  She denies any chest pain.  Bumping into lots of things and has bruising on her legs.   Her potassium was low at her last check.    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  Asked about stopping the plavix   Jan. 24, 2017:  Sarah Ballard is doing ok Has been losing weight.   Lots of stress with taking care of her father and her divorce and her son .  No CP .     November 17, 2015:  Doing ok.  Sleepy .  Had to work late last night . No CP,  Breathing is good Lots of anxiety with work stress.   July 12, 2016:  Doing ok from a cardiac standpoint Has gained some weight .  Rare episodes of CP - she thinks its due to stress or indigestion,   its not related to exertion  requests a refill of her NTG  Is trying to move  Is having lots of artheritis   Pain  Is falling lots at work   Nov. 14,  2018  Has  Moved since I last saw her. Is having some chest pain  Thinks its GERD or anxeity.  Lots of stress  Lots of stress related to the move.    The CP are atypical , do not feel like her previouis angina Takes NTG - does not really relieve the CP Finds TUMS work   Is planning on having bunion surgery at the end of this year Will be at low risk Will need to hold Plavix for 5 days prior to surgery .    Father has gained some fluid .   Feb. 27, 2019 Doing well Is very appreciative that I sent her to see Emiliano Dyer for her anxiety .  Father recently had the flu  Sarah Ballard has had a cough / cold.   Developed quickly. Has been seen by Dr. 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    December 26, 2017:  Sarah Ballard is seen today for follow-up visit. MRI of her hip shows that she needs to have a hip replacement Sees Dr. Charlann Boxer . No angina    April, 23, 2021:  Sarah Ballard is doing well  Has some indigestion  Not relieved with  protonix.  , suggested pepcid complete in addition to her Protonix   No angina  Needs to exercise more.  Has gained some weight Has been getting short of breath while walking ,  I suspect most of this dyspnea is due to weight gain and general deconditioning .  No angina similar to her presenting symptoms of angina pain from years ago .   February 22, 2020: Sarah Ballard is doing well.  She is seen today for follow-up of her coronary artery disease and hyperlipidemia.  Her blood pressure and heart rate are normal. Had covid Sept. 22 .   Has had 2 Pfizer covid vaccines and now has had the booster (oct. 22) . Marland Kitchen Had chills, headache  vomitting , sneezing for a few days  Is still fatigued.   Nov. 1, 2022 Sarah Ballard is seen today for follow up of her CAD , HLD No cardiac issues,  no angina   Had bilateral knee injections with Dr. Charlann Boxer Injections has helped quite a bit   Walks her dog daily .  Does some video exercises  I recommended a stationary bike    Continues to struggling with weight gain .   Cant keep consistent weight loss program    Oct. 30, 2023 Sarah Ballard is seen today for follow up of her CAD  Wt today is  177 lbs   Her MD changed her anxiety meds   Doing well from a cardiac standpoint Has some GERD symptoms  Is going to the GYM.   Has been told she is prediabetic ( was eating lots of ice cream this summer )   No CP, No CP with working out .  Limited by knee pain    Lipids from Sept 15, 2023 were reviewed and look good  Total cholesterol is 131 HDL is 51 LDL is 63 Glyceride level is 87   Nov. 12, 2024 Sarah Ballard is seen for follow up of her CAD , HLD   Still depressed, still not working  No angina    Current Outpatient Medications on File Prior to Visit  Medication Sig Dispense Refill   amitriptyline (ELAVIL) 10 MG tablet Take 20 mg by mouth at bedtime.     busPIRone (BUSPAR) 15 MG tablet Take 30 mg by mouth 2 (two) times daily.     calcium carbonate (TUMS - DOSED IN MG ELEMENTAL CALCIUM) 500 MG chewable tablet Chew 1-2 tablets by mouth 3 (three) times daily as needed for indigestion or heartburn.     Calcium Carbonate-Vitamin D 600-400 MG-UNIT tablet Take 1 tablet by mouth daily.     clopidogrel (PLAVIX) 75 MG tablet TAKE 1 TABLET BY MOUTH DAILY *PLEASE KEEP APPOINTMENT FOR FUTURE REFILLS 30 tablet 0   COVID-19 mRNA bivalent vaccine, Pfizer, (PFIZER COVID-19 VAC BIVALENT) injection Inject into the muscle. 0.3 mL 0   COVID-19 mRNA Vac-TriS, Pfizer, (PFIZER-BIONT COVID-19 VAC-TRIS) SUSP injection Inject into the muscle. 0.3 mL 0   cyclobenzaprine (FLEXERIL) 10 MG tablet Take 1 tablet (10 mg total) by mouth 3 (three) times daily as needed for muscle spasms. 30 tablet 0   Docusate Sodium (DSS) 100 MG CAPS Take by mouth.     fluticasone (FLONASE) 50 MCG/ACT nasal spray Place 2 sprays into both nostrils daily.     ibuprofen (ADVIL) 200 MG tablet Take by mouth.     influenza vaccine adjuvanted (FLUAD) 0.5 ML injection  Inject into the muscle. 0.5 mL 0   irbesartan (AVAPRO) 150 MG tablet TAKE 1 TABLET BY MOUTH  DAILY 30 tablet 0   levothyroxine (SYNTHROID) 100 MCG tablet Take 1 tablet by mouth daily. And 1/2 tablet on Sundays     LORazepam (ATIVAN) 0.5 MG tablet      Magnesium 400 MG CAPS Take 400 mg by mouth daily.     metoprolol tartrate (LOPRESSOR) 25 MG tablet TAKE 1/2 TABLET BY MOUTH TWICE A DAY WITH FOOD 90 tablet 0   nitroGLYCERIN (NITROSTAT) 0.4 MG SL tablet DISSOLVE 1 TAB UNDER TONGUE FOR CHEST PAIN - IF PAIN REMAINS AFTER 5 MIN, CALL 911 AND REPEAT DOSE. MAX 3 TABS IN 15 MINUTES 25 tablet 3   omega-3 acid ethyl esters (LOVAZA) 1 G capsule Take 2 g by mouth 2 (two) times daily.     oxybutynin (DITROPAN XL) 10 MG 24 hr tablet take daily Oral     pantoprazole (PROTONIX) 40 MG tablet TAKE ONE TABLET BY MOUTH DAILY AS NEEDED FOR HEARTBURN OR GERD 90 tablet 3   rosuvastatin (CRESTOR) 40 MG tablet TAKE ONE TABLET BY MOUTH DAILY 90 tablet 2   SM Zinc 50 MG TABS 1 daily Oral     venlafaxine XR (EFFEXOR-XR) 75 MG 24 hr capsule      Wheat Dextrin (BENEFIBER) POWD Take 1 Scoop by mouth 3 (three) times daily. As needed     Zoster Vaccine Adjuvanted [facility]) injection Inject into the muscle. 0.5 mL 0   Calcium Carbonate-Vit D-Min (CALCIUM 600+D3 PLUS MINERALS) 600-800 MG-UNIT CHEW Take one tablet by mouth twice daily Oral     Current Facility-Administered Medications on File Prior to Visit  Medication Dose Route Frequency Provider Last Rate Last Admin   0.9 %  sodium chloride infusion  500 mL Intravenous Once Creola Corn, MD        Allergies  Allergen Reactions   Latex     itching   Lisinopril Cough   Tramadol Hcl Nausea And Vomiting   Zoster Vac Recomb Adjuvanted Other (See Comments)    Past Medical History:  Diagnosis Date   Angina pectoris ([facility]) 2005   with stent placement Anterior decscending   Anxiety    Arthritis    Bone spur    Right Hip   CAD (coronary artery disease) 2005   s/p PCI LAD  and cutting balloon angioplasty  with stent reexpansion 04/2008   Colon polyps    Degenerative disc disease    Depression    GERD (gastroesophageal reflux disease)    Hypercholesterolemia    Hyperlipemia    Hypertension    Hypothyroidism    MI (myocardial infarction) ([facility]) 2005   angio with stent   Positive PPD    neg. CXR, treated with INH for 1 yr.  about 1982   UTI (urinary tract infection) 05/31/15    Past Surgical History:  Procedure Laterality Date   BUNIONECTOMY     COLONOSCOPY     CORONARY ANGIOPLASTY  2009   CORONARY ANGIOPLASTY WITH STENT PLACEMENT  2005   HEMORRHOID SURGERY  06/2015   Removal    LAPAROSCOPY     for endometriosis   LEFT HEART CATHETERIZATION WITH CORONARY ANGIOGRAM Bilateral 06/27/2011   Procedure: LEFT HEART CATHETERIZATION WITH CORONARY ANGIOGRAM;  Surgeon: Kathleene Hazel, MD;  Location: [facility] CATH LAB;  Service: Cardiovascular;  Laterality: Bilateral;   LUMBAR EPIDURAL INJECTION     many   NERVE ROOT BLOCK     epidural steroids; multiple   POLYPECTOMY     ruptured disc  2007   with epidural  TONGUE BIOPSY     keratin accumulation - Benign    TONSILLECTOMY     TOTAL HIP ARTHROPLASTY Right 01/21/2018   Procedure: RIGHT TOTAL HIP ARTHROPLASTY ANTERIOR APPROACH;  Surgeon: Durene Romans, MD;  Location: [facility] ORS;  Service: Orthopedics;  Laterality: Right;    TUBAL LIGATION      Social History   Tobacco Use  Smoking Status Never  Smokeless Tobacco Never    Social History   Substance and Sexual Activity  Alcohol Use Yes   Alcohol/week: 1.0 standard drink of alcohol   Types: 1 Glasses of wine per week   Comment: occ wine    Family History  Problem Relation Age of Onset   Colon cancer Mother 69   Stroke Mother    Hypertension Mother    Diabetes Mother    Rheum arthritis Mother    Atrial fibrillation Mother    Heart attack Father        x2   Diabetes Father    Kidney disease Father    Heart disease Father    Stroke  Maternal Grandmother    Colon cancer Paternal Grandmother    Diabetes Paternal Grandmother    Esophageal cancer Cousin 64   Rectal cancer Neg Hx    Stomach cancer Neg Hx     Reviw of Systems:  Reviewed in the HPI.  All other systems are negative.    Physical Exam: Blood pressure 116/86, pulse 72, height 5\' 6"  (1.676 m), weight 180 lb (81.6 kg), last menstrual period 06/29/2006, SpO2 97%.       GEN:  Well nourished, well developed in no acute distress HEENT: Normal NECK: No JVD; No carotid bruits LYMPHATICS: No lymphadenopathy CARDIAC: RRR , no murmurs, rubs, gallops RESPIRATORY:  Clear to auscultation without rales, wheezing or rhonchi  ABDOMEN: Soft, non-tender, non-distended MUSCULOSKELETAL:  No edema; No deformity  SKIN: Warm and dry NEUROLOGIC:  Alert and oriented x 3   ECG :    EKG Interpretation Date/Time:  Tuesday March 12 2023 14:32:58 EST Ventricular Rate:  81 PR Interval:  136 QRS Duration:  68 QT Interval:  374 QTC Calculation: 434 R Axis:   -11  Text Interpretation: Normal sinus rhythm Cannot rule out Anterior infarct , age undetermined When compared with ECG of 28-Jun-2011 06:24, Minimal criteria for Anterior infarct are now Present Confirmed by Kristeen Miss ([id number]) on 03/12/2023 2:54:10 PM    Assessment / Plan:   1. Coronary artery disease-       no angina .   ECG shows poor R wave progression Will get a follow up echo for further assessment of her LV function    2. Anxiety -         3. Hyperlipidemia -    last LDL was 63.   Check labs today    4. Hypertension  -     BP is well controlled.   5. Hypothyroidism  6. Chronic back pain  7. Weight gain :   is working on weight loss     .      Kristeen Miss, MD  03/12/2023 2:54 PM    [facility] [address] [place],  [address] [place], [place]  [address] Pager [phone] Phone: [phone]; Fax: [phone]

## 2023-03-12 ENCOUNTER — Ambulatory Visit: Payer: PPO | Attending: Cardiovascular Disease | Admitting: Cardiovascular Disease

## 2023-03-12 VITALS — BP 116/86 | HR 72 | Ht 66.0 in | Wt 180.0 lb

## 2023-03-12 DIAGNOSIS — E782 Mixed hyperlipidemia: Secondary | ICD-10-CM

## 2023-03-12 DIAGNOSIS — I2510 Atherosclerotic heart disease of native coronary artery without angina pectoris: Secondary | ICD-10-CM

## 2023-03-12 NOTE — Patient Instructions (Signed)
Lab Work: Lipids, ALT, BMET today If you have labs (blood work) drawn today and your tests are completely normal, you will receive your results only by: MyChart Message (if you have MyChart) OR A paper copy in the mail If you have any lab test that is abnormal or we need to change your treatment, we will call you to review the results.  Testing/Procedures: ECHO Your physician has requested that you have an echocardiogram. Echocardiography is a painless test that uses sound waves to create images of your heart. It provides your doctor with information about the size and shape of your heart and how well your heart's chambers and valves are working. This procedure takes approximately one hour. There are no restrictions for this procedure. Please do NOT wear cologne, perfume, aftershave, or lotions (deodorant is allowed). Please arrive 15 minutes prior to your appointment time.  Please note: We ask at that you not bring children with you during ultrasound (echo/ vascular) testing. Due to room size and safety concerns, children are not allowed in the ultrasound rooms during exams. Our front office staff cannot provide observation of children in our lobby area while testing is being conducted. An adult accompanying a patient to their appointment will only be allowed in the ultrasound room at the discretion of the ultrasound technician under special circumstances. We apologize for any inconvenience.  Follow-Up: At [facility], you and your health needs are our priority.  As part of our continuing mission to provide you with exceptional heart care, we have created designated Provider Care Teams.  These Care Teams include your primary Cardiologist (physician) and Advanced Practice Providers (APPs -  Physician Assistants and Nurse Practitioners) who all work together to provide you with the care you need, when you need it.  Your next appointment:   6 month(s)  Provider:   Kristeen Miss, MD

## 2023-03-13 ENCOUNTER — Other Ambulatory Visit: Payer: Self-pay | Admitting: Cardiovascular Disease

## 2023-03-13 LAB — BASIC METABOLIC PANEL
BUN/Creatinine Ratio: 22 (ref 12–28)
BUN: 18 mg/dL (ref 8–27)
CO2: 22 mmol/L (ref 20–29)
Calcium: 9 mg/dL (ref 8.7–10.3)
Chloride: 103 mmol/L (ref 96–106)
Creatinine, Ser: 0.82 mg/dL (ref 0.57–1.00)
Glucose: 91 mg/dL (ref 70–99)
Potassium: 4.2 mmol/L (ref 3.5–5.2)
Sodium: 140 mmol/L (ref 134–144)
eGFR: 78 mL/min/{1.73_m2} (ref >59)

## 2023-03-13 LAB — LIPID PANEL
Chol/HDL Ratio: 2.8 ratio (ref 0.0–4.4)
Cholesterol, Total: 170 mg/dL (ref 100–199)
HDL: 60 mg/dL (ref >39)
LDL Chol Calc (NIH): 88 mg/dL (ref 0–99)
Triglycerides: 123 mg/dL (ref 0–149)
VLDL Cholesterol Cal: 22 mg/dL (ref 5–40)

## 2023-03-13 LAB — ALT: ALT: 38 [IU]/L — ABNORMAL HIGH (ref 0–32)

## 2023-03-14 ENCOUNTER — Telehealth: Payer: Self-pay

## 2023-03-14 DIAGNOSIS — Z79899 Other long term (current) drug therapy: Secondary | ICD-10-CM

## 2023-03-14 DIAGNOSIS — E782 Mixed hyperlipidemia: Secondary | ICD-10-CM

## 2023-03-14 NOTE — Telephone Encounter (Signed)
-----   Message from Kristeen Miss sent at 03/14/2023  9:44 AM EST ----- ALT is normal BMP is stable LDL is 88 Her goal is < 70 Add Zetia 10 mg a day Advise her to continue to work on weight loss, improved diet, regular exercise  Recheck lipids and BMP , ALT several days prior to her 6 month appt.

## 2023-03-14 NOTE — Telephone Encounter (Signed)
Called and spoke with patient who is very hesitant to start Zetia. She is adamant that she wants to work on diet first and see if she can get an improvement in her numbers. She's due to come for return visit in 6 months with labs one week prior. After these labs if LDL is still elevated, she will proceed with recommendation for zetia. Pt requests a prescription for Lovaza. Informed her that this works on triglycerides, but wouldn't lower her LDL. She says she understands, but she still wants this prescribed for her instead of OTC fish oil. Routing to MD for okay.

## 2023-03-15 ENCOUNTER — Telehealth: Payer: Self-pay | Admitting: Cardiovascular Disease

## 2023-03-15 NOTE — Telephone Encounter (Signed)
Pt c/o medication issue:  1. Name of Medication: fish oil  2. How are you currently taking this medication (dosage and times per day)?    3. Are you having a reaction (difficulty breathing--STAT)? no  4. What is your medication issue? Calling to see if she can get a prescription for fish oil. Please advise

## 2023-03-18 NOTE — Telephone Encounter (Signed)
Nahser, Sarah Ping, MD  Lars Mage, RN Caller: Unspecified (4 days ago,  2:50 PM) As accurately noted by [name], RN The Lovasa vs. OTC fish oil is for her Triglycerides I prefer Lovasa but she stopped it due to cost. She needs an LDL < 70 She needs something additional besides fish oil for this I agree that diet, exercise will also help PN  Left detailed voicemail for patient explaining the above information. TG's are in normal range, so need for Lovaza.

## 2023-03-19 NOTE — Telephone Encounter (Signed)
Returned call to patient 03/18/23 and left message explaining that Dr Elease Hashimoto didn't want to prescribe the Lovaza since her triglycerides were normal. She needs to work on diet, exercise, and strongly consider the Zetia to decrease LDL.

## 2023-03-21 ENCOUNTER — Other Ambulatory Visit: Payer: Self-pay | Admitting: Cardiovascular Disease

## 2023-03-21 DIAGNOSIS — R0789 Other chest pain: Secondary | ICD-10-CM

## 2023-03-21 DIAGNOSIS — Z96641 Presence of right artificial hip joint: Secondary | ICD-10-CM

## 2023-03-21 DIAGNOSIS — I2510 Atherosclerotic heart disease of native coronary artery without angina pectoris: Secondary | ICD-10-CM

## 2023-03-22 ENCOUNTER — Other Ambulatory Visit: Payer: Self-pay

## 2023-03-22 MED ORDER — IRBESARTAN 150 MG PO TABS: 150.0000 mg | ORAL_TABLET | Freq: Every day | ORAL | 3 refills | Status: DC

## 2023-03-27 DIAGNOSIS — N3946 Mixed incontinence: Secondary | ICD-10-CM | POA: Diagnosis not present

## 2023-03-27 DIAGNOSIS — R350 Frequency of micturition: Secondary | ICD-10-CM | POA: Diagnosis not present

## 2023-04-03 DIAGNOSIS — F341 Dysthymic disorder: Secondary | ICD-10-CM | POA: Diagnosis not present

## 2023-04-07 ENCOUNTER — Other Ambulatory Visit: Payer: Self-pay | Admitting: Cardiovascular Disease

## 2023-04-08 ENCOUNTER — Ambulatory Visit (HOSPITAL_COMMUNITY): Payer: PPO | Attending: Cardiology

## 2023-04-08 DIAGNOSIS — I2510 Atherosclerotic heart disease of native coronary artery without angina pectoris: Secondary | ICD-10-CM | POA: Insufficient documentation

## 2023-04-08 DIAGNOSIS — E782 Mixed hyperlipidemia: Secondary | ICD-10-CM | POA: Insufficient documentation

## 2023-04-08 LAB — ECHOCARDIOGRAM COMPLETE
Calc EF: 55.7 %
S' Lateral: 2.36 cm
Single Plane A2C EF: 52.6 %
Single Plane A4C EF: 58.1 %

## 2023-04-15 DIAGNOSIS — Z1231 Encounter for screening mammogram for malignant neoplasm of breast: Secondary | ICD-10-CM | POA: Diagnosis not present

## 2023-05-08 DIAGNOSIS — M170 Bilateral primary osteoarthritis of knee: Secondary | ICD-10-CM | POA: Diagnosis not present

## 2023-07-02 DIAGNOSIS — F341 Dysthymic disorder: Secondary | ICD-10-CM | POA: Diagnosis not present

## 2023-07-23 ENCOUNTER — Other Ambulatory Visit: Payer: Self-pay | Admitting: Gastroenterology

## 2023-08-27 DIAGNOSIS — I2510 Atherosclerotic heart disease of native coronary artery without angina pectoris: Secondary | ICD-10-CM | POA: Diagnosis not present

## 2023-08-27 DIAGNOSIS — R739 Hyperglycemia, unspecified: Secondary | ICD-10-CM | POA: Diagnosis not present

## 2023-08-27 DIAGNOSIS — E039 Hypothyroidism, unspecified: Secondary | ICD-10-CM | POA: Diagnosis not present

## 2023-08-27 DIAGNOSIS — F419 Anxiety disorder, unspecified: Secondary | ICD-10-CM | POA: Diagnosis not present

## 2023-08-27 DIAGNOSIS — E669 Obesity, unspecified: Secondary | ICD-10-CM | POA: Diagnosis not present

## 2023-08-27 DIAGNOSIS — I10 Essential (primary) hypertension: Secondary | ICD-10-CM | POA: Diagnosis not present

## 2023-08-27 DIAGNOSIS — R32 Unspecified urinary incontinence: Secondary | ICD-10-CM | POA: Diagnosis not present

## 2023-08-27 DIAGNOSIS — E785 Hyperlipidemia, unspecified: Secondary | ICD-10-CM | POA: Diagnosis not present

## 2023-08-27 DIAGNOSIS — I878 Other specified disorders of veins: Secondary | ICD-10-CM | POA: Diagnosis not present

## 2023-08-27 DIAGNOSIS — M8580 Other specified disorders of bone density and structure, unspecified site: Secondary | ICD-10-CM | POA: Diagnosis not present

## 2023-08-27 DIAGNOSIS — I119 Hypertensive heart disease without heart failure: Secondary | ICD-10-CM | POA: Diagnosis not present

## 2023-08-27 DIAGNOSIS — F329 Major depressive disorder, single episode, unspecified: Secondary | ICD-10-CM | POA: Diagnosis not present

## 2023-08-27 DIAGNOSIS — M1990 Unspecified osteoarthritis, unspecified site: Secondary | ICD-10-CM | POA: Diagnosis not present

## 2023-09-24 DIAGNOSIS — D692 Other nonthrombocytopenic purpura: Secondary | ICD-10-CM | POA: Diagnosis not present

## 2023-09-24 DIAGNOSIS — L570 Actinic keratosis: Secondary | ICD-10-CM | POA: Diagnosis not present

## 2023-09-24 DIAGNOSIS — L821 Other seborrheic keratosis: Secondary | ICD-10-CM | POA: Diagnosis not present

## 2023-09-24 DIAGNOSIS — D2371 Other benign neoplasm of skin of right lower limb, including hip: Secondary | ICD-10-CM | POA: Diagnosis not present

## 2023-09-24 DIAGNOSIS — D1801 Hemangioma of skin and subcutaneous tissue: Secondary | ICD-10-CM | POA: Diagnosis not present

## 2023-09-24 DIAGNOSIS — D225 Melanocytic nevi of trunk: Secondary | ICD-10-CM | POA: Diagnosis not present

## 2023-09-30 NOTE — Progress Notes (Signed)
 Sarah Ballard Date of Birth  [birth year] [facility] [address]    [address] [place], [place]  [address] [phone]  Fax  [phone]  Problem List 1. Coronary artery disease-  2. anxiety 3. Hyperlipidemia 4. Hypertension 5. Hypothyroidism 6. Chronic back pain     The 68 yo  female with a history of coronary artery disease. She's status post PTCA and stenting of her left anterior descending artery. She also had Cutting Balloon procedure and reexpansion of that stent in January 2010.  She was admitted recently to the hospital for chest pain an dizziness and cath revealed no significant irregularities.  He is walking twice a week. She's not having episodes of angina with walking. 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  October 27, 2012:  Sarah Ballard is doing well from a cardiac standpoint.   She is having some back pain - needs to have a back injection but does not want to stop the Plavix .    October 27, 2013:  Sarah Ballard is doing well.  She denies any chest pain.  Bumping into lots of things and has bruising on her legs.   Her potassium was low at her last check.    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  Asked about stopping the plavix    Jan. 24, 2017:  Sarah Ballard is doing ok Has been losing weight.   Lots of stress with taking care of her father and her divorce and her son .  No CP .     November 17, 2015:  Doing ok.  Sleepy .  Had to work late last night . No CP,  Breathing is good Lots of anxiety with work stress.   July 12, 2016:  Doing ok from a cardiac standpoint Has gained some weight .  Rare episodes of CP - she thinks its due to stress or indigestion,   its not related to exertion  requests a refill of her NTG  Is trying to move  Is having lots of artheritis   Pain  Is falling lots at work   Nov. 14,  2018  Has  Moved since I last saw her. Is having some chest pain  Thinks its GERD or anxeity.  Lots of stress  Lots of stress related to the move.    The CP are atypical , do not feel like her previouis angina Takes NTG - does not really relieve the CP Finds TUMS work   Is planning on having bunion surgery at the end of this year Will be at low risk Will need to hold Plavix  for 5 days prior to surgery .    Father has gained some fluid .   Feb. 27, 2019 Doing well Is very appreciative that I sent her to see Ludger [place] for her anxiety .  Father recently had the flu  Sarah Ballard has had a cough / cold.   Developed quickly. Has been seen by Dr. 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    December 26, 2017:  Sarah Ballard is seen today for follow-up visit. MRI of her hip shows that she needs to have a hip replacement Sees Dr. Bernard Brick . No angina    April, 23, 2021:  Sarah Ballard is doing well  Has some indigestion  Not relieved with  protonix .  , suggested pepcid complete in addition to her Protonix    No angina  Needs to exercise more.  Has gained some weight Has been getting short of breath while walking ,  I suspect most of this dyspnea is due to weight gain and general deconditioning .  No angina similar to her presenting symptoms of angina pain from years ago .   February 22, 2020: Sarah Ballard is doing well.  She is seen today for follow-up of her coronary artery disease and hyperlipidemia.  Her blood pressure and heart rate are normal. Had covid Sept. 22 .   Has had 2 Pfizer covid vaccines and now has had the booster (oct. 22) . Aaron Aas Had chills, headache  vomitting , sneezing for a few days  Is still fatigued.   Nov. 1, 2022 Sarah Ballard is seen today for follow up of her CAD , HLD No cardiac issues,  no angina   Had bilateral knee injections with Dr. Olin Injections has helped quite a bit   Walks her dog daily .  Does some video exercises  I recommended a stationary bike    Continues to struggling with weight gain .   Cant keep consistent weight loss program    Oct. 30, 2023 Sarah Ballard is seen today for follow up of her CAD  Wt today is  177 lbs   Her MD changed her anxiety meds   Doing well from a cardiac standpoint Has some GERD symptoms  Is going to the GYM.   Has been told she is prediabetic ( was eating lots of ice cream this summer )   No CP, No CP with working out .  Limited by knee pain    Lipids from Sept 15, 2023 were reviewed and look good  Total cholesterol is 131 HDL is 51 LDL is 63 Glyceride level is 87   Nov. 12, 2024 Sarah Ballard is seen for follow up of her CAD , HLD   Still depressed, still not working  No angina    October 01, 2023 Sarah Ballard is seen for follow up of her CAD  Is depressed  Is working on weight loss  No CP , no dyspnea   Her LDL is mildly elevated   Current Outpatient Medications on File Prior to Visit  Medication Sig Dispense Refill   amitriptyline (ELAVIL) 10 MG tablet Take 20 mg by mouth at bedtime.     busPIRone  (BUSPAR ) 15 MG tablet Take 30 mg by mouth 2 (two) times daily.     calcium  carbonate (TUMS - DOSED IN MG ELEMENTAL CALCIUM ) 500 MG chewable tablet Chew 1-2 tablets by mouth 3 (three) times daily as needed for indigestion or heartburn.     Calcium  Carbonate-Vit D-Min (CALCIUM  600+D3 PLUS MINERALS) 600-800 MG-UNIT CHEW Take one tablet by mouth twice daily Oral     Calcium  Carbonate-Vitamin D  600-400 MG-UNIT tablet Take 1 tablet by mouth daily.     clopidogrel  (PLAVIX ) 75 MG tablet TAKE 1 TABLET BY MOUTH DAILY *PLEASE KEEP APPOINTMENT FOR FUTURE REFILLS 90 tablet 3   COVID-19 mRNA bivalent vaccine, Pfizer, (PFIZER COVID-19 VAC BIVALENT) injection Inject into the muscle. 0.3 mL 0   COVID-19 mRNA Vac-TriS, Pfizer, (PFIZER-BIONT COVID-19 VAC-TRIS) SUSP injection Inject into the muscle. 0.3 mL 0   cyclobenzaprine  (FLEXERIL ) 10 MG tablet Take 1 tablet (10 mg total) by mouth 3 (three) times daily as needed for  muscle spasms. 30 tablet 0   Docusate Sodium  (DSS) 100 MG CAPS Take by mouth.  fluticasone  (FLONASE ) 50 MCG/ACT nasal spray Place 2 sprays into both nostrils daily.     ibuprofen  (ADVIL ) 200 MG tablet Take by mouth.     influenza vaccine adjuvanted (FLUAD) 0.5 ML injection Inject into the muscle. 0.5 mL 0   irbesartan  (AVAPRO ) 150 MG tablet Take 1 tablet (150 mg total) by mouth daily. 90 tablet 3   levothyroxine  (SYNTHROID ) 100 MCG tablet Take 1 tablet by mouth daily. And 1/2 tablet on Sundays     LORazepam (ATIVAN) 0.5 MG tablet      Magnesium  400 MG CAPS Take 400 mg by mouth daily.     metoprolol  tartrate (LOPRESSOR ) 25 MG tablet TAKE 1/2 TABLET BY MOUTH TWO TIMES A DAY WITH FOOD 90 tablet 3   omega-3 acid ethyl esters (LOVAZA ) 1 G capsule Take 2 g by mouth 2 (two) times daily.     oxybutynin  (DITROPAN  XL) 10 MG 24 hr tablet take daily Oral     pantoprazole  (PROTONIX ) 40 MG tablet TAKE 1 TABLET BY MOUTH DAILY AS NEEDED FOR HEARTBURN OR GERD 90 tablet 3   rosuvastatin  (CRESTOR ) 40 MG tablet TAKE 1 TABLET BY MOUTH DAILY 90 tablet 3   SM Zinc 50 MG TABS 1 daily Oral     venlafaxine XR (EFFEXOR-XR) 75 MG 24 hr capsule      Wheat Dextrin (BENEFIBER) POWD Take 1 Scoop by mouth 3 (three) times daily. As needed     Zoster Vaccine Adjuvanted (SHINGRIX ) injection Inject into the muscle. 0.5 mL 0   Current Facility-Administered Medications on File Prior to Visit  Medication Dose Route Frequency Provider Last Rate Last Admin   0.9 %  sodium chloride  infusion  500 mL Intravenous Once Margarete Sharps, MD        Allergies  Allergen Reactions   Latex     itching   Lisinopril Cough   Tramadol Hcl Nausea And Vomiting   Zoster Vac Recomb Adjuvanted Other (See Comments)    Past Medical History:  Diagnosis Date   Angina pectoris (HCC) 2005   with stent placement Anterior decscending   Anxiety    Arthritis    Bone spur    Right Hip   CAD (coronary artery disease) 2005   s/p PCI LAD and cutting  balloon angioplasty  with stent reexpansion 04/2008   Colon polyps    Degenerative disc disease    Depression    GERD (gastroesophageal reflux disease)    Hypercholesterolemia    Hyperlipemia    Hypertension    Hypothyroidism    MI (myocardial infarction) (HCC) 2005   angio with stent   Positive PPD    neg. CXR, treated with INH for 1 yr.  about 1982   UTI (urinary tract infection) 05/31/15    Past Surgical History:  Procedure Laterality Date   BUNIONECTOMY     COLONOSCOPY     CORONARY ANGIOPLASTY  2009   CORONARY ANGIOPLASTY WITH STENT PLACEMENT  2005   HEMORRHOID SURGERY  06/2015   Removal    LAPAROSCOPY     for endometriosis   LEFT HEART CATHETERIZATION WITH CORONARY ANGIOGRAM Bilateral 06/27/2011   Procedure: LEFT HEART CATHETERIZATION WITH CORONARY ANGIOGRAM;  Surgeon: Odie Benne, MD;  Location: [facility] CATH LAB;  Service: Cardiovascular;  Laterality: Bilateral;   LUMBAR EPIDURAL INJECTION     many   NERVE ROOT BLOCK     epidural steroids; multiple   POLYPECTOMY     ruptured disc  2007   with epidural  TONGUE BIOPSY     keratin accumulation - Benign    TONSILLECTOMY     TOTAL HIP ARTHROPLASTY Right 01/21/2018   Procedure: RIGHT TOTAL HIP ARTHROPLASTY ANTERIOR APPROACH;  Surgeon: Claiborne Crew, MD;  Location: [facility] ORS;  Service: Orthopedics;  Laterality: Right;    TUBAL LIGATION      Social History   Tobacco Use  Smoking Status Never  Smokeless Tobacco Never    Social History   Substance and Sexual Activity  Alcohol  Use Yes   Alcohol /week: 1.0 standard drink of alcohol    Types: 1 Glasses of wine per week   Comment: occ wine    Family History  Problem Relation Age of Onset   Colon cancer Mother 77   Stroke Mother    Hypertension Mother    Diabetes Mother    Rheum arthritis Mother    Atrial fibrillation Mother    Heart attack Father        x2   Diabetes Father    Kidney disease Father    Heart disease Father    Stroke Maternal  Grandmother    Colon cancer Paternal Grandmother    Diabetes Paternal Grandmother    Esophageal cancer Cousin 67   Rectal cancer Neg Hx    Stomach cancer Neg Hx     Reviw of Systems:  Reviewed in the HPI.  All other systems are negative.     Physical Exam: Blood pressure 116/72, pulse 82, height 5\' 6"  (1.676 m), weight 182 lb 9.6 oz (82.8 kg), last menstrual period 06/29/2006, SpO2 97%.       GEN:  Well nourished, well developed in no acute distress HEENT: Normal NECK: No JVD; No carotid bruits LYMPHATICS: No lymphadenopathy CARDIAC: RRR , no murmurs, rubs, gallops RESPIRATORY:  Clear to auscultation without rales, wheezing or rhonchi  ABDOMEN: Soft, non-tender, non-distended MUSCULOSKELETAL:  No edema; No deformity  SKIN: Warm and dry NEUROLOGIC:  Alert and oriented x 3    ECG :         Assessment / Plan:   1. Coronary artery disease-       status post stenting of her LAD.  She is overall doing well.   2. Anxiety -         3. Hyperlipidemia -    last lipid level revealed an LDL of 88.  Her LDL goal is less than 70.  Continue current medications.  Will recheck labs today.   4. Hypertension  -     blood pressure is well-controlled.  Continue current medications.  5. Hypothyroidism  6. Chronic back pain  7. Weight gain :   She is working on weight loss.    Ahmad Alert, MD  10/01/2023 5:35 PM    [facility] [address] [place],  [address] [place], [place]  [address] Pager [phone] Phone: [phone]; Fax: [phone]

## 2023-10-01 ENCOUNTER — Ambulatory Visit: Attending: Cardiovascular Disease | Admitting: Cardiovascular Disease

## 2023-10-01 ENCOUNTER — Encounter: Payer: Self-pay | Admitting: Cardiovascular Disease

## 2023-10-01 VITALS — BP 116/72 | HR 82 | Ht 66.0 in | Wt 182.6 lb

## 2023-10-01 DIAGNOSIS — Z79899 Other long term (current) drug therapy: Secondary | ICD-10-CM | POA: Diagnosis not present

## 2023-10-01 DIAGNOSIS — I2510 Atherosclerotic heart disease of native coronary artery without angina pectoris: Secondary | ICD-10-CM

## 2023-10-01 DIAGNOSIS — E782 Mixed hyperlipidemia: Secondary | ICD-10-CM | POA: Diagnosis not present

## 2023-10-01 MED ORDER — NITROGLYCERIN 0.4 MG SL SUBL: SUBLINGUAL_TABLET | SUBLINGUAL | 6 refills | Status: AC

## 2023-10-01 NOTE — Patient Instructions (Signed)
 Medication Instructions:  REFILLED Nitroglycerin  *If you need a refill on your cardiac medications before your next appointment, please call your pharmacy*  Lab Work: Lipids, ALT, BMET today If you have labs (blood work) drawn today and your tests are completely normal, you will receive your results only by: MyChart Message (if you have MyChart) OR A paper copy in the mail If you have any lab test that is abnormal or we need to change your treatment, we will call you to review the results. llow-Up: At [facility], you and your health needs are our priority.  As part of our continuing mission to provide you with exceptional heart care, our providers are all part of one team.  This team includes your primary Cardiologist (physician) and Advanced Practice Providers or APPs (Physician Assistants and Nurse Practitioners) who all work together to provide you with the care you need, when you need it.  Your next appointment:   1 year(s)  Provider:   Olinda Bertrand, MD

## 2023-10-02 ENCOUNTER — Ambulatory Visit: Payer: Self-pay | Admitting: *Deleted

## 2023-10-02 LAB — BASIC METABOLIC PANEL WITH GFR
BUN/Creatinine Ratio: 22 (ref 12–28)
BUN: 17 mg/dL (ref 8–27)
CO2: 19 mmol/L — ABNORMAL LOW (ref 20–29)
Calcium: 9.3 mg/dL (ref 8.7–10.3)
Chloride: 103 mmol/L (ref 96–106)
Creatinine, Ser: 0.76 mg/dL (ref 0.57–1.00)
Glucose: 99 mg/dL (ref 70–99)
Potassium: 4.4 mmol/L (ref 3.5–5.2)
Sodium: 140 mmol/L (ref 134–144)
eGFR: 85 mL/min/{1.73_m2} (ref >59)

## 2023-10-02 LAB — LIPID PANEL
Chol/HDL Ratio: 2.5 ratio (ref 0.0–4.4)
Cholesterol, Total: 128 mg/dL (ref 100–199)
HDL: 52 mg/dL (ref >39)
LDL Chol Calc (NIH): 61 mg/dL (ref 0–99)
Triglycerides: 78 mg/dL (ref 0–149)
VLDL Cholesterol Cal: 15 mg/dL (ref 5–40)

## 2023-10-02 LAB — ALT: ALT: 36 IU/L — ABNORMAL HIGH (ref 0–32)

## 2023-10-07 ENCOUNTER — Ambulatory Visit (HOSPITAL_COMMUNITY)
Admission: RE | Admit: 2023-10-07 | Discharge: 2023-10-07 | Disposition: A | Discharge status: Home / Self Care | Source: Ambulatory Visit | Attending: Surgery | Admitting: Surgery

## 2023-10-07 ENCOUNTER — Other Ambulatory Visit (HOSPITAL_COMMUNITY): Payer: Self-pay | Admitting: Orthopedic Surgery

## 2023-10-07 DIAGNOSIS — M79605 Pain in left leg: Secondary | ICD-10-CM

## 2023-10-07 DIAGNOSIS — M79641 Pain in right hand: Secondary | ICD-10-CM | POA: Diagnosis not present

## 2023-10-07 DIAGNOSIS — M1811 Unilateral primary osteoarthritis of first carpometacarpal joint, right hand: Secondary | ICD-10-CM | POA: Diagnosis not present

## 2023-10-09 DIAGNOSIS — M25562 Pain in left knee: Secondary | ICD-10-CM | POA: Diagnosis not present

## 2023-10-09 DIAGNOSIS — M1712 Unilateral primary osteoarthritis, left knee: Secondary | ICD-10-CM | POA: Diagnosis not present

## 2023-11-13 DIAGNOSIS — M25561 Pain in right knee: Secondary | ICD-10-CM | POA: Diagnosis not present

## 2023-11-13 DIAGNOSIS — M1712 Unilateral primary osteoarthritis, left knee: Secondary | ICD-10-CM | POA: Diagnosis not present

## 2023-11-20 DIAGNOSIS — M1712 Unilateral primary osteoarthritis, left knee: Secondary | ICD-10-CM | POA: Diagnosis not present

## 2023-11-27 DIAGNOSIS — M1712 Unilateral primary osteoarthritis, left knee: Secondary | ICD-10-CM | POA: Diagnosis not present

## 2023-12-03 DIAGNOSIS — F341 Dysthymic disorder: Secondary | ICD-10-CM | POA: Diagnosis not present

## 2024-02-10 DIAGNOSIS — E785 Hyperlipidemia, unspecified: Secondary | ICD-10-CM | POA: Diagnosis not present

## 2024-02-10 DIAGNOSIS — E039 Hypothyroidism, unspecified: Secondary | ICD-10-CM | POA: Diagnosis not present

## 2024-02-10 DIAGNOSIS — I10 Essential (primary) hypertension: Secondary | ICD-10-CM | POA: Diagnosis not present

## 2024-02-10 DIAGNOSIS — Z1212 Encounter for screening for malignant neoplasm of rectum: Secondary | ICD-10-CM | POA: Diagnosis not present

## 2024-02-10 DIAGNOSIS — M8580 Other specified disorders of bone density and structure, unspecified site: Secondary | ICD-10-CM | POA: Diagnosis not present

## 2024-02-10 DIAGNOSIS — R739 Hyperglycemia, unspecified: Secondary | ICD-10-CM | POA: Diagnosis not present

## 2024-02-17 DIAGNOSIS — R82998 Other abnormal findings in urine: Secondary | ICD-10-CM | POA: Diagnosis not present

## 2024-02-17 DIAGNOSIS — M549 Dorsalgia, unspecified: Secondary | ICD-10-CM | POA: Diagnosis not present

## 2024-02-17 DIAGNOSIS — E785 Hyperlipidemia, unspecified: Secondary | ICD-10-CM | POA: Diagnosis not present

## 2024-02-17 DIAGNOSIS — I119 Hypertensive heart disease without heart failure: Secondary | ICD-10-CM | POA: Diagnosis not present

## 2024-02-17 DIAGNOSIS — F4310 Post-traumatic stress disorder, unspecified: Secondary | ICD-10-CM | POA: Diagnosis not present

## 2024-02-17 DIAGNOSIS — F419 Anxiety disorder, unspecified: Secondary | ICD-10-CM | POA: Diagnosis not present

## 2024-02-17 DIAGNOSIS — M1990 Unspecified osteoarthritis, unspecified site: Secondary | ICD-10-CM | POA: Diagnosis not present

## 2024-02-17 DIAGNOSIS — Z1339 Encounter for screening examination for other mental health and behavioral disorders: Secondary | ICD-10-CM | POA: Diagnosis not present

## 2024-02-17 DIAGNOSIS — Z1331 Encounter for screening for depression: Secondary | ICD-10-CM | POA: Diagnosis not present

## 2024-02-17 DIAGNOSIS — I2510 Atherosclerotic heart disease of native coronary artery without angina pectoris: Secondary | ICD-10-CM | POA: Diagnosis not present

## 2024-02-17 DIAGNOSIS — E039 Hypothyroidism, unspecified: Secondary | ICD-10-CM | POA: Diagnosis not present

## 2024-02-17 DIAGNOSIS — F329 Major depressive disorder, single episode, unspecified: Secondary | ICD-10-CM | POA: Diagnosis not present

## 2024-02-17 DIAGNOSIS — Z0000 Encounter for general adult medical examination without abnormal findings: Secondary | ICD-10-CM | POA: Diagnosis not present

## 2024-03-09 ENCOUNTER — Other Ambulatory Visit: Payer: Self-pay | Admitting: Cardiology

## 2024-03-10 ENCOUNTER — Other Ambulatory Visit: Payer: Self-pay | Admitting: Cardiology

## 2024-03-11 MED ORDER — ROSUVASTATIN CALCIUM 40 MG PO TABS: 40.0000 mg | ORAL_TABLET | Freq: Every day | ORAL | 2 refills | Status: AC

## 2024-03-16 ENCOUNTER — Other Ambulatory Visit: Payer: Self-pay | Admitting: Cardiology

## 2024-03-18 ENCOUNTER — Other Ambulatory Visit: Payer: Self-pay | Admitting: Cardiology

## 2024-03-18 MED ORDER — IRBESARTAN 150 MG PO TABS
150.0000 mg | ORAL_TABLET | Freq: Every day | ORAL | 2 refills | Status: AC
Start: 2024-03-18 — End: ?

## 2024-03-23 ENCOUNTER — Other Ambulatory Visit: Payer: Self-pay | Admitting: Cardiology

## 2024-03-23 DIAGNOSIS — I2510 Atherosclerotic heart disease of native coronary artery without angina pectoris: Secondary | ICD-10-CM

## 2024-03-23 DIAGNOSIS — Z96641 Presence of right artificial hip joint: Secondary | ICD-10-CM

## 2024-03-23 DIAGNOSIS — R0789 Other chest pain: Secondary | ICD-10-CM

## 2024-03-24 MED ORDER — CLOPIDOGREL BISULFATE 75 MG PO TABS: 75.0000 mg | ORAL_TABLET | Freq: Every day | ORAL | 2 refills | Status: AC

## 2024-03-25 DIAGNOSIS — R351 Nocturia: Secondary | ICD-10-CM | POA: Diagnosis not present

## 2024-03-25 DIAGNOSIS — R350 Frequency of micturition: Secondary | ICD-10-CM | POA: Diagnosis not present

## 2024-03-25 DIAGNOSIS — N3281 Overactive bladder: Secondary | ICD-10-CM | POA: Diagnosis not present

## 2024-04-01 ENCOUNTER — Other Ambulatory Visit: Payer: Self-pay

## 2024-04-01 DIAGNOSIS — F341 Dysthymic disorder: Secondary | ICD-10-CM | POA: Diagnosis not present

## 2024-04-02 ENCOUNTER — Other Ambulatory Visit: Payer: Self-pay | Admitting: Cardiology

## 2024-04-06 ENCOUNTER — Other Ambulatory Visit: Payer: Self-pay | Admitting: Cardiology

## 2024-04-07 MED ORDER — METOPROLOL TARTRATE 25 MG PO TABS: 12.5000 mg | ORAL_TABLET | Freq: Two times a day (BID) | ORAL | 2 refills | Status: AC
